# Patient Record
Sex: Female | Born: 1940
Health system: Southern US, Community
[De-identification: ages and names within clinical notes are randomized; demographics above are authoritative.]

## PROBLEM LIST (undated history)

## (undated) DIAGNOSIS — Z860100 Personal history of colon polyps, unspecified: Secondary | ICD-10-CM

## (undated) DIAGNOSIS — E039 Hypothyroidism, unspecified: Secondary | ICD-10-CM

## (undated) DIAGNOSIS — I4819 Other persistent atrial fibrillation: Secondary | ICD-10-CM

## (undated) DIAGNOSIS — R87610 Atypical squamous cells of undetermined significance on cytologic smear of cervix (ASC-US): Secondary | ICD-10-CM

## (undated) DIAGNOSIS — I340 Nonrheumatic mitral (valve) insufficiency: Secondary | ICD-10-CM

## (undated) DIAGNOSIS — C801 Malignant (primary) neoplasm, unspecified: Secondary | ICD-10-CM

## (undated) DIAGNOSIS — M5430 Sciatica, unspecified side: Secondary | ICD-10-CM

## (undated) DIAGNOSIS — E559 Vitamin D deficiency, unspecified: Secondary | ICD-10-CM

## (undated) DIAGNOSIS — M545 Low back pain, unspecified: Secondary | ICD-10-CM

## (undated) DIAGNOSIS — G4700 Insomnia, unspecified: Secondary | ICD-10-CM

## (undated) HISTORY — PX: TONSILLECTOMY: SUR1361

## (undated) HISTORY — DX: Personal history of colon polyps, unspecified: Z86.0100

## (undated) HISTORY — DX: Other persistent atrial fibrillation: I48.19

## (undated) HISTORY — DX: Atypical squamous cells of undetermined significance on cytologic smear of cervix (ASC-US): R87.610

## (undated) HISTORY — DX: Low back pain, unspecified: M54.50

## (undated) HISTORY — DX: Vitamin D deficiency, unspecified: E55.9

## (undated) HISTORY — PX: WISDOM TOOTH EXTRACTION: SHX21

## (undated) HISTORY — DX: Low back pain: M54.5

## (undated) HISTORY — DX: Hypothyroidism, unspecified: E03.9

## (undated) HISTORY — DX: Nonrheumatic mitral (valve) insufficiency: I34.0

## (undated) HISTORY — DX: Personal history of colonic polyps: Z86.010

## (undated) HISTORY — PX: COLON RESECTION: SHX5231

## (undated) HISTORY — DX: Insomnia, unspecified: G47.00

## (undated) HISTORY — DX: Malignant (primary) neoplasm, unspecified: C80.1

## (undated) HISTORY — DX: Sciatica, unspecified side: M54.30

---

## 2016-05-17 LAB — HM PAP SMEAR: HM Pap smear: NORMAL

## 2016-09-07 DIAGNOSIS — N3000 Acute cystitis without hematuria: Secondary | ICD-10-CM | POA: Diagnosis not present

## 2016-09-07 DIAGNOSIS — R300 Dysuria: Secondary | ICD-10-CM | POA: Diagnosis not present

## 2016-09-07 DIAGNOSIS — N898 Other specified noninflammatory disorders of vagina: Secondary | ICD-10-CM | POA: Diagnosis not present

## 2016-09-07 DIAGNOSIS — N952 Postmenopausal atrophic vaginitis: Secondary | ICD-10-CM | POA: Diagnosis not present

## 2016-09-07 DIAGNOSIS — N3001 Acute cystitis with hematuria: Secondary | ICD-10-CM | POA: Diagnosis not present

## 2016-10-24 DIAGNOSIS — M1712 Unilateral primary osteoarthritis, left knee: Secondary | ICD-10-CM | POA: Diagnosis not present

## 2016-10-24 DIAGNOSIS — M25562 Pain in left knee: Secondary | ICD-10-CM | POA: Diagnosis not present

## 2017-05-07 DIAGNOSIS — Z23 Encounter for immunization: Secondary | ICD-10-CM | POA: Diagnosis not present

## 2017-05-21 DIAGNOSIS — I482 Chronic atrial fibrillation: Secondary | ICD-10-CM | POA: Diagnosis not present

## 2017-05-24 DIAGNOSIS — Z23 Encounter for immunization: Secondary | ICD-10-CM | POA: Diagnosis not present

## 2017-06-20 DIAGNOSIS — Z1231 Encounter for screening mammogram for malignant neoplasm of breast: Secondary | ICD-10-CM | POA: Diagnosis not present

## 2017-06-20 DIAGNOSIS — Z6827 Body mass index (BMI) 27.0-27.9, adult: Secondary | ICD-10-CM | POA: Diagnosis not present

## 2017-06-20 DIAGNOSIS — L282 Other prurigo: Secondary | ICD-10-CM | POA: Diagnosis not present

## 2017-06-20 DIAGNOSIS — I482 Chronic atrial fibrillation: Secondary | ICD-10-CM | POA: Diagnosis not present

## 2017-06-20 DIAGNOSIS — G4709 Other insomnia: Secondary | ICD-10-CM | POA: Diagnosis not present

## 2017-06-20 DIAGNOSIS — Z01419 Encounter for gynecological examination (general) (routine) without abnormal findings: Secondary | ICD-10-CM | POA: Diagnosis not present

## 2017-07-03 DIAGNOSIS — I482 Chronic atrial fibrillation: Secondary | ICD-10-CM | POA: Diagnosis not present

## 2017-07-16 DIAGNOSIS — I482 Chronic atrial fibrillation: Secondary | ICD-10-CM | POA: Diagnosis not present

## 2017-07-16 DIAGNOSIS — R002 Palpitations: Secondary | ICD-10-CM | POA: Diagnosis not present

## 2017-07-16 DIAGNOSIS — I340 Nonrheumatic mitral (valve) insufficiency: Secondary | ICD-10-CM | POA: Diagnosis not present

## 2017-10-19 DIAGNOSIS — M1712 Unilateral primary osteoarthritis, left knee: Secondary | ICD-10-CM | POA: Diagnosis not present

## 2017-10-19 DIAGNOSIS — M25562 Pain in left knee: Secondary | ICD-10-CM | POA: Diagnosis not present

## 2017-10-26 DIAGNOSIS — H6121 Impacted cerumen, right ear: Secondary | ICD-10-CM | POA: Diagnosis not present

## 2017-11-08 DIAGNOSIS — Z1231 Encounter for screening mammogram for malignant neoplasm of breast: Secondary | ICD-10-CM | POA: Diagnosis not present

## 2017-11-08 DIAGNOSIS — Z9289 Personal history of other medical treatment: Secondary | ICD-10-CM | POA: Diagnosis not present

## 2017-11-08 LAB — HM MAMMOGRAPHY

## 2017-11-09 DIAGNOSIS — M1712 Unilateral primary osteoarthritis, left knee: Secondary | ICD-10-CM | POA: Diagnosis not present

## 2018-04-26 DIAGNOSIS — Z23 Encounter for immunization: Secondary | ICD-10-CM | POA: Diagnosis not present

## 2018-05-08 DIAGNOSIS — J3089 Other allergic rhinitis: Secondary | ICD-10-CM | POA: Diagnosis not present

## 2018-05-08 DIAGNOSIS — R2681 Unsteadiness on feet: Secondary | ICD-10-CM | POA: Insufficient documentation

## 2018-05-21 DIAGNOSIS — I499 Cardiac arrhythmia, unspecified: Secondary | ICD-10-CM | POA: Insufficient documentation

## 2018-05-21 DIAGNOSIS — I38 Endocarditis, valve unspecified: Secondary | ICD-10-CM | POA: Insufficient documentation

## 2018-05-21 DIAGNOSIS — T7840XA Allergy, unspecified, initial encounter: Secondary | ICD-10-CM | POA: Insufficient documentation

## 2018-05-28 ENCOUNTER — Ambulatory Visit (INDEPENDENT_AMBULATORY_CARE_PROVIDER_SITE_OTHER): Payer: Medicare Other | Admitting: Internal Medicine

## 2018-05-28 VITALS — BP 128/78 | HR 87 | Temp 98.1°F | Ht 66.0 in | Wt 160.1 lb

## 2018-05-28 DIAGNOSIS — E2839 Other primary ovarian failure: Secondary | ICD-10-CM

## 2018-05-28 DIAGNOSIS — I38 Endocarditis, valve unspecified: Secondary | ICD-10-CM | POA: Diagnosis not present

## 2018-05-28 DIAGNOSIS — E559 Vitamin D deficiency, unspecified: Secondary | ICD-10-CM | POA: Insufficient documentation

## 2018-05-28 DIAGNOSIS — R42 Dizziness and giddiness: Secondary | ICD-10-CM | POA: Insufficient documentation

## 2018-05-28 DIAGNOSIS — M51369 Other intervertebral disc degeneration, lumbar region without mention of lumbar back pain or lower extremity pain: Secondary | ICD-10-CM | POA: Insufficient documentation

## 2018-05-28 DIAGNOSIS — I4811 Longstanding persistent atrial fibrillation: Secondary | ICD-10-CM | POA: Insufficient documentation

## 2018-05-28 DIAGNOSIS — I4819 Other persistent atrial fibrillation: Secondary | ICD-10-CM | POA: Diagnosis not present

## 2018-05-28 DIAGNOSIS — R05 Cough: Secondary | ICD-10-CM

## 2018-05-28 DIAGNOSIS — J329 Chronic sinusitis, unspecified: Secondary | ICD-10-CM | POA: Diagnosis not present

## 2018-05-28 DIAGNOSIS — G4700 Insomnia, unspecified: Secondary | ICD-10-CM | POA: Diagnosis not present

## 2018-05-28 DIAGNOSIS — K1370 Unspecified lesions of oral mucosa: Secondary | ICD-10-CM

## 2018-05-28 DIAGNOSIS — M5136 Other intervertebral disc degeneration, lumbar region: Secondary | ICD-10-CM | POA: Diagnosis not present

## 2018-05-28 DIAGNOSIS — Z85038 Personal history of other malignant neoplasm of large intestine: Secondary | ICD-10-CM | POA: Diagnosis not present

## 2018-05-28 DIAGNOSIS — E039 Hypothyroidism, unspecified: Secondary | ICD-10-CM | POA: Diagnosis not present

## 2018-05-28 DIAGNOSIS — T7840XD Allergy, unspecified, subsequent encounter: Secondary | ICD-10-CM

## 2018-05-28 DIAGNOSIS — R059 Cough, unspecified: Secondary | ICD-10-CM

## 2018-05-28 MED ORDER — AZITHROMYCIN 250 MG PO TABS: ORAL_TABLET | ORAL | 0 refills | Status: DC

## 2018-05-28 NOTE — Progress Notes (Signed)
Patient was informed of results.  Patient understood and no questions, comments, or concerns at this time.  

## 2018-05-28 NOTE — Patient Instructions (Addendum)
Call and please clarify did you have prevnar or pneumonia 23 vaccine? It looks like prevnar 05/2015 but unclear from the notes   prevnar 1 vaccine and done  Pneumonia 23 vaccine every 5 years  rec Tdap vaccine every 10 years I will check vaccine database for that it seems like you've had this one     [facility] fasting labs as soon as possible [phone]   We will order bone density [facility]    Cough, Adult Coughing is a reflex that clears your throat and your airways. Coughing helps to heal and protect your lungs. It is normal to cough occasionally, but a cough that happens with other symptoms or lasts a long time may be a sign of a condition that needs treatment. A cough may last only 2-3 weeks (acute), or it may last longer than 8 weeks (chronic). What are the causes? Coughing is commonly caused by:  Breathing in substances that irritate your lungs.  A viral or bacterial respiratory infection.  Allergies.  Asthma.  Postnasal drip.  Smoking.  Acid backing up from the stomach into the esophagus (gastroesophageal reflux).  Certain medicines.  Chronic lung problems, including COPD (or rarely, lung cancer).  Other medical conditions such as heart failure.  Follow these instructions at home: Pay attention to any changes in your symptoms. Take these actions to help with your discomfort:  Take medicines only as told by your health care provider. ? If you were prescribed an antibiotic medicine, take it as told by your health care provider. Do not stop taking the antibiotic even if you start to feel better. ? Talk with your health care provider before you take a cough suppressant medicine.  Drink enough fluid to keep your urine clear or pale yellow.  If the air is dry, use a cold steam vaporizer or humidifier in your bedroom or your home to help loosen secretions.  Avoid anything that causes you to cough at work or  at home.  If your cough is worse at night, try sleeping in a semi-upright position.  Avoid cigarette smoke. If you smoke, quit smoking. If you need help quitting, ask your health care provider.  Avoid caffeine.  Avoid alcohol.  Rest as needed.  Contact a health care provider if:  You have new symptoms.  You cough up pus.  Your cough does not get better after 2-3 weeks, or your cough gets worse.  You cannot control your cough with suppressant medicines and you are losing sleep.  You develop pain that is getting worse or pain that is not controlled with pain medicines.  You have a fever.  You have unexplained weight loss.  You have night sweats. Get help right away if:  You cough up blood.  You have difficulty breathing.  Your heartbeat is very fast. This information is not intended to replace advice given to you by your health care provider. Make sure you discuss any questions you have with your health care provider. Document Released: 01/13/2011 Document Revised: 12/23/2015 Document Reviewed: 09/23/2014 Elsevier Interactive Patient Education  2018 [employer].      DTaP Vaccine (Diphtheria, Tetanus, and Pertussis): What You Need to Know 1. Why get vaccinated? Diphtheria, tetanus, and pertussis are serious diseases caused by bacteria. Diphtheria and pertussis are spread from person to person. Tetanus enters the body through cuts or wounds. DIPHTHERIA causes a thick covering in the back of the throat.  It can lead to breathing problems, paralysis, heart failure, and even death.  TETANUS (Lockjaw) causes painful tightening of the muscles, usually all over the body.  It can lead to "locking" of the jaw so the victim cannot open his mouth or swallow. Tetanus leads to death in up to 2 out of 10 cases.  PERTUSSIS (Whooping Cough) causes coughing spells so bad that it is hard for infants to eat, drink, or breathe. These spells can last for weeks.  It can lead to  pneumonia, seizures (jerking and staring spells), brain damage, and death.  Diphtheria, tetanus, and pertussis vaccine (DTaP) can help prevent these diseases. Most children who are vaccinated with DTaP will be protected throughout childhood. Many more children would get these diseases if we stopped vaccinating. DTaP is a safer version of an older vaccine called DTP. DTP is no longer used in the [place]. 2. Who should get DTaP vaccine and when? Children should get 5 doses of DTaP vaccine, one dose at each of the following ages:  2 months  4 months  6 months  15-18 months  4-6 years  DTaP may be given at the same time as other vaccines. 3. Some children should not get DTaP vaccine or should wait  Children with minor illnesses, such as a cold, may be vaccinated. But children who are moderately or severely ill should usually wait until they recover before getting DTaP vaccine.  Any child who had a life-threatening allergic reaction after a dose of DTaP should not get another dose.  Any child who suffered a brain or nervous system disease within 7 days after a dose of DTaP should not get another dose.  Talk with your doctor if your child: ? had a seizure or collapsed after a dose of DTaP, ? cried non-stop for 3 hours or more after a dose of DTaP, ? had a fever over 105F after a dose of DTaP. Ask your doctor for more information. Some of these children should not get another dose of pertussis vaccine, but may get a vaccine without pertussis, called DT. 4. Older children and adults DTaP is not licensed for adolescents, adults, or children 77 years of age and older. But older people still need protection. A vaccine called Tdap is similar to DTaP. A single dose of Tdap is recommended for people 11 through 79 years of age. Another vaccine, called Td, protects against tetanus and diphtheria, but not pertussis. It is recommended every 10 years. There are separate Vaccine Information  Statements for these vaccines. 5. What are the risks from DTaP vaccine? Getting diphtheria, tetanus, or pertussis disease is much riskier than getting DTaP vaccine. However, a vaccine, like any medicine, is capable of causing serious problems, such as severe allergic reactions. The risk of DTaP vaccine causing serious harm, or death, is extremely small. Mild problems (common)  Fever (up to about 1 child in 4)  Redness or swelling where the shot was given (up to about 1 child in 4)  Soreness or tenderness where the shot was given (up to about 1 child in 4) These problems occur more often after the 4th and 5th doses of the DTaP series than after earlier doses. Sometimes the 4th or 5th dose of DTaP vaccine is followed by swelling of the entire arm or leg in which the shot was given, lasting 1-7 days (up to about 1 child in 13). Other mild problems include:  Fussiness (up to about 1 child in 3)  Tiredness or  poor appetite (up to about 1 child in 10)  Vomiting (up to about 1 child in 3) These problems generally occur 1-3 days after the shot. Moderate problems (uncommon)  Seizure (jerking or staring) (about 1 child out of 14,000)  Non-stop crying, for 3 hours or more (up to about 1 child out of 1,000)  High fever, over 105F (about 1 child out of 16,000) Severe problems (very rare)  Serious allergic reaction (less than 1 out of a million doses)  Several other severe problems have been reported after DTaP vaccine. These include: ? Long-term seizures, coma, or lowered consciousness ? Permanent brain damage. These are so rare it is hard to tell if they are caused by the vaccine. Controlling fever is especially important for children who have had seizures, for any reason. It is also important if another family member has had seizures. You can reduce fever and pain by giving your child an aspirin-free pain reliever when the shot is given, and for the next 24 hours, following the package  instructions. 6. What if there is a serious reaction? What should I look for? Look for anything that concerns you, such as signs of a severe allergic reaction, very high fever, or behavior changes. Signs of a severe allergic reaction can include hives, swelling of the face and throat, difficulty breathing, a fast heartbeat, dizziness, and weakness. These would start a few minutes to a few hours after the vaccination. What should I do?  If you think it is a severe allergic reaction or other emergency that can't wait, call 9-1-1 or get the person to the nearest hospital. Otherwise, call your doctor.  Afterward, the reaction should be reported to the Vaccine Adverse Event Reporting System (VAERS). Your doctor might file this report, or you can do it yourself through the VAERS web site at www.vaers.SamedayNews.es, or by calling 902-851-6765. ? VAERS is only for reporting reactions. They do not give medical advice. 7. The National Vaccine Injury Compensation Program The [employer] Vaccine Injury Compensation Program (VICP) is a federal program that was created to compensate people who may have been injured by certain vaccines. Persons who believe they may have been injured by a vaccine can learn about the program and about filing a claim by calling 508-408-4166 or visiting the [employer] website at GoldCloset.com.ee. 8. How can I learn more?  Ask your doctor.  Call your local or state health department.  Contact the Centers for Disease Control and Prevention (CDC): ? Call 445-179-4895 (1-800-CDC-INFO) or ? Visit CDC's website at http://hunter.com/ CDC DTaP Vaccine (Diphtheria, Tetanus, and Pertussis) VIS (12/14/05) This information is not intended to replace advice given to you by your health care provider. Make sure you discuss any questions you have with your health care provider. Document Released: 05/14/2006 Document Revised: 04/06/2016 Document Reviewed: 04/06/2016 Elsevier  Interactive Patient Education  2017 [employer].

## 2018-05-28 NOTE — Progress Notes (Signed)
Chief Complaint  Patient presents with  . Establish Care   New pt moved from [place] lives in [place] friend is patient here   1. H/o persistent Afib h/o echo 07/03/17 with moderate to severe mitral regurg and mild pulmonic valve regurgitation  -prev cardiologist Dr. Limmie Patricia  -echo 07/03/17 EF 55-60% mild LVH, severely abnormal left atrial volume index, RA severely dilated, moderate to severe mitral regurg, severe tricuspid regurg, possible pulm HTN PAP 37-50 mmHG, mild pulmonic valve regurgitation   She is on Xarelto 20 mg qd and toprol xl 25 mg qd   2. H/o colon cancer due for colonoscopy q3 years she will go back to prior GI MD iDr. Jearld Pies [place] for now   3. C/o dizziness chronic since 2017 per notes w/o vertigo Meclizine has been given to her in the past w/o relief. Just saw ENT Dr. Hassell Done in [place] who thinks she has chronic sinus issues and allergies and she is pending to be tested for allergies   4. H/o neuropathy vs sciatica/lumbar radiculopathy (see Xray low back)  Xray 01/28/13 multilevel Deg changes with mild anterolisthesis L3 on L4 and L4 on L5   5. Hypothyroidism with h/o goiter per notes though [place] 05/23/16 neegative and abnormal thyroid labs elevated TSH 5.950 05/03/15 and elevated thyroglobulin 7.0 in the past she is on levo 75 mcg qd  6. C/o Chronic cough ? Etiology she is former smoker light but quit 40 years ago  29. Vitamin D def 26.6 05/18/16  8.  Right of uvula in 0.2 cm papule patient has not noted this and just went to ENT who did not mention  9.  insomnia on valium 5 mg qhs prn     Review of Systems  Constitutional: Negative for weight loss.  Eyes: Negative for blurred vision.  Respiratory: Positive for cough. Negative for shortness of breath.   Cardiovascular: Negative for chest pain.  Gastrointestinal: Negative for heartburn.  Musculoskeletal: Negative for falls.  Skin:       C/o hair loss    Neurological: Positive for  dizziness and sensory change.  Psychiatric/Behavioral: Negative for depression. The patient has insomnia.    Past Medical History:  Diagnosis Date  . Atrial fibrillation, persistent   . Cancer [facility])    colon cancer 1999 s/p resection   . History of colon polyps   . Insomnia   . Low back pain   . Mitral regurgitation   . Pap smear abnormality of cervix with ASCUS favoring benign   . Sciatica   . Vitamin D deficiency    Past Surgical History:  Procedure Laterality Date  . COLON RESECTION     1999 for cancer   . TONSILLECTOMY    . WISDOM TOOTH EXTRACTION     Family History  Problem Relation Age of Onset  . Heart disease Father   . Hyperlipidemia Father   . Osteopenia Sister    Social History   Socioeconomic History  . Marital status: Divorced    Spouse name: Not on file  . Number of children: Not on file  . Years of education: Not on file  . Highest education level: Not on file  Occupational History  . Not on file  Social Needs  . Financial resource strain: Not on file  . Food insecurity:    Worry: Not on file    Inability: Not on file  . Transportation needs:    Medical: Not on  file    Non-medical: Not on file  Tobacco Use  . Smoking status: Former Research scientist (life sciences)  . Smokeless tobacco: Never Used  . Tobacco comment: former quit in 30s light smoker per pt   Substance and Sexual Activity  . Alcohol use: Yes  . Drug use: Not Currently  . Sexual activity: Not Currently  Lifestyle  . Physical activity:    Days per week: Not on file    Minutes per session: Not on file  . Stress: Not on file  Relationships  . Social connections:    Talks on phone: Not on file    Gets together: Not on file    Attends religious service: Not on file    Active member of club or organization: Not on file    Attends meetings of clubs or organizations: Not on file    Relationship status: Not on file  . Intimate partner violence:    Fear of current or ex partner: Not on file    Emotionally  abused: Not on file    Physically abused: Not on file    Forced sexual activity: Not on file  Other Topics Concern  . Not on file  Social History Narrative   Divorced    College grad    Moved from [place] to [place]   Former Office manager    No guns   Wears seat belts    Current Meds  Medication Sig  . Calcium Carb-Cholecalciferol (CALCIUM PLUS VITAMIN D3 PO) Take by mouth.  . diazepam (VALIUM) 5 MG tablet Take 5 mg by mouth daily as needed for anxiety.  . metoprolol succinate (TOPROL-XL) 25 MG 24 hr tablet 25 mg daily.   Marland Kitchen SYNTHROID 75 MCG tablet Take 75 mcg by mouth daily before breakfast.   . XARELTO 20 MG TABS tablet Take 20 mg by mouth daily.    Allergies  Allergen Reactions  . Codeine     Nausea    . Other     Tree pollen     No results found for this or any previous visit (from the past 2160 hour(s)). Objective  Body mass index is 25.84 kg/m. Wt Readings from Last 3 Encounters:  05/28/18 160 lb 1.9 oz (72.6 kg)   Temp Readings from Last 3 Encounters:  05/28/18 98.1 F (36.7 C) (Oral)   BP Readings from Last 3 Encounters:  05/28/18 128/78   Pulse Readings from Last 3 Encounters:  05/28/18 87    Physical Exam  Constitutional: She is oriented to person, place, and time. Vital signs are normal. She appears well-developed and well-nourished. She is cooperative.  HENT:  Head: Normocephalic and atraumatic.  Mouth/Throat: Oropharynx is clear and moist and mucous membranes are normal.    Eyes: Pupils are equal, round, and reactive to light. Conjunctivae are normal.  Cardiovascular: Normal rate and normal heart sounds. An irregularly irregular rhythm present.  In Afib today    Pulmonary/Chest: Effort normal and breath sounds normal.  Neurological: She is alert and oriented to person, place, and time.  BL walks with cane   Skin: Skin is warm, dry and intact.  Psychiatric: She has a normal mood and affect. Her speech is normal and  behavior is normal. Judgment and thought content normal. Cognition and memory are normal.  Nursing note and vitals reviewed.   Assessment   1. H/o persistent Afib h/o echo 07/03/17 with moderate to severe mitral regurg and mild pulmonic valve regurgitation  -prev cardiologist Dr. Waldemar Dickens  [place]  -echo 07/03/17 EF 55-60% mild LVH, severely abnormal left atrial volume index, RA severely dilated, moderate to severe mitral regurg, severe tricuspid regurg, possible pulm HTN PAP 37-50 mmHG, mild pulmonic valve regurgitation   2. H/o colon cancer  3. C/o dizziness chronic since 2017 per notes w/o vertigo sxs ddx chronic sinus issues, r/o other central etiology, ? If could be related orthostatics vs persistent Afib 4. H/o neuropathy vs sciatica/lumbar radiculopathy  5. Hypothyroidism with h/o goiter and abnormal thyroid labs elevated TSH 5.950 05/03/15 and elevated thyroglobulin 7.0 in the past  Thyroid US 05/23/16 unremarkable  6. Chronic cough ? Etiology she is former smoker so copd/bronchitis in ddx. Also GERD 7. Vitamin D def 26.6 05/18/16  8. HM 9. Right of uvula in 0.2 cm papule  10 insomnia  Plan  1. Pt maybe considering Dr. Fletcher Anon but will confirm and let me know also  Cont BB and Xarelto  2. Will need GI sees Dr. Jearld Pies in [place] pt states she may continue to f/u with him due now repeat colonoscopy  3. Disc we could w/u with MRI brain she wants to wait for now  Consider neurology in future  No meclizine room is not spinning like vertigo and tried in the past 12.5 mg per notes and does not help  Consider check orthostatics at f/u  She just saw ENT Dr. Hassell Done in [facility] pending allergy test  tx zpack for sinus disease short term to see if helps sinus sxs  4. Consider neurology in future  Consider MRI lumbar  5. Check labs upcoming h/o thyroglobulin elevated  Consider repeat thyroid US Cont meds  If thyroid labs abnormal consider endocrine  6. Disc CXR today pt does not  want today  7. Check vitamin D level 8.  Flu shot had 04/26/18  pna 23 vaccine had 05/22/17, due for prevnar if has not had  Consider shingrix and Tdap in future if has not had check [facility] Declines MMR check   Given [facility] form to get fasting labs [facility] CMET, CBC, lipid, UA, TSH, FT4, FT3, thryoglobulin Ab (h/o elevation 7.0 in the past 05/17/16) , anti thyroid antibody (TPO antibody), vitamin D, ferritin/iron/TIBC, vitamin D. Declines MMR  Pap last 2017 Ob/GYN Dr. Olean Ree [facility] [place] h/o ASCUS pap with h/o HPV + per prior PCP notes  -per notes pap 05/17/16 neg pap +atrophy neg HPV  -HPV + neg pap 09/02/12 -ASCUS pap 02/12/12 +HPV -out of age window pap currently   mammo 11/08/17 [facility] normal  -next she wants to have in [place] will need to find facility there she can go  dexa pt wanted to do [facility] [place] then called back and wants to go in [place] does not know name of facility will call around [facility] will check in [place] if no DEXA available go with original plan  Coloscopy q3 years last in 2015 or 2016 h/o colon cancer s/p resection 1999 due to f/u per pt consider to f/u with Dr. Jearld Pies in [place]   Former smoker quit in 89s light smoker   Reviewed records (I.e labs, mammo, pap, [place] thyriod and Xray low back and echo) - former PCP at [facility] [place]   9. Disc with pt show ENT or dentist  10 on valium 5 mg qhs  Provider: Dr. Olivia Mackie [facility]

## 2018-05-29 ENCOUNTER — Encounter: Payer: Self-pay | Admitting: Internal Medicine

## 2018-05-29 ENCOUNTER — Telehealth: Payer: Self-pay | Admitting: Internal Medicine

## 2018-05-29 NOTE — Telephone Encounter (Signed)
Copied from [place] [id number]. Topic: Referral - Question >> May 29, 2018  2:17 PM Judyann Munson wrote: Reason for CRM: Patient was seen today and is calling to advise she would like her Bone density test to be completed closure to home in [place] . She is also stating that she is still searching for the information of when she received her pneumonia shot

## 2018-05-29 NOTE — Telephone Encounter (Signed)
Copied from [place] [id number]. Topic: Referral - Question >> May 29, 2018  2:17 PM Judyann Munson wrote: Reason for CRM: Patient was seen today and is calling to advise she would like her Bone density test to be completed closure to home in [place] . She is also stating that she is still searching for the information of when she received her pneumonia shot >> May 29, 2018  2:43 PM Vernona Rieger wrote: Patient called back and said she had " pneumonzax23 October 25th, 2018

## 2018-05-29 NOTE — Telephone Encounter (Signed)
We can send her to [facility] in [place] if they do bone densities will call to verify  [place] can you call to see if they do bone densities there   Micael [place] pneumona 23 vaccine given 05/24/17   Fransisco Beau  Ask pt if she has had prevnar?   Thanks [employer]

## 2018-05-30 NOTE — Telephone Encounter (Signed)
She has not had prevnar

## 2018-05-31 NOTE — Telephone Encounter (Signed)
They do not do bone densities @ [facility]. Was given [facility] and [facility]. Called both places. They only do bone densities for their established patients.

## 2018-05-31 NOTE — Telephone Encounter (Signed)
Inform pt she needs to have bone density where I originally ordered it [facility] [place] [facility]  Otherwise sch in [place]   TSM

## 2018-06-03 ENCOUNTER — Other Ambulatory Visit: Payer: Self-pay | Admitting: Internal Medicine

## 2018-06-03 DIAGNOSIS — I4891 Unspecified atrial fibrillation: Secondary | ICD-10-CM | POA: Diagnosis not present

## 2018-06-03 DIAGNOSIS — E559 Vitamin D deficiency, unspecified: Secondary | ICD-10-CM | POA: Diagnosis not present

## 2018-06-03 DIAGNOSIS — E611 Iron deficiency: Secondary | ICD-10-CM | POA: Diagnosis not present

## 2018-06-03 DIAGNOSIS — Z1389 Encounter for screening for other disorder: Secondary | ICD-10-CM | POA: Diagnosis not present

## 2018-06-03 DIAGNOSIS — D649 Anemia, unspecified: Secondary | ICD-10-CM | POA: Diagnosis not present

## 2018-06-03 DIAGNOSIS — E039 Hypothyroidism, unspecified: Secondary | ICD-10-CM | POA: Diagnosis not present

## 2018-06-03 DIAGNOSIS — Z13220 Encounter for screening for lipoid disorders: Secondary | ICD-10-CM | POA: Diagnosis not present

## 2018-06-03 DIAGNOSIS — R946 Abnormal results of thyroid function studies: Secondary | ICD-10-CM | POA: Diagnosis not present

## 2018-06-04 LAB — URINALYSIS, ROUTINE W REFLEX MICROSCOPIC
Bilirubin, UA: NEGATIVE
Glucose, UA: NEGATIVE
Ketones, UA: NEGATIVE
Nitrite, UA: NEGATIVE
Protein, UA: NEGATIVE
RBC, UA: NEGATIVE
Specific Gravity, UA: 1.018 (ref 1.005–1.030)
Urobilinogen, Ur: 0.2 mg/dL (ref 0.2–1.0)
pH, UA: 6 (ref 5.0–7.5)

## 2018-06-04 LAB — COMPREHENSIVE METABOLIC PANEL
ALT: 26 IU/L (ref 0–32)
AST: 24 IU/L (ref 0–40)
Albumin/Globulin Ratio: 1.8 (ref 1.2–2.2)
Albumin: 4.8 g/dL (ref 3.5–4.8)
Alkaline Phosphatase: 86 IU/L (ref 39–117)
BUN/Creatinine Ratio: 23 (ref 12–28)
BUN: 18 mg/dL (ref 8–27)
Bilirubin Total: 0.8 mg/dL (ref 0.0–1.2)
CO2: 23 mmol/L (ref 20–29)
Calcium: 9.9 mg/dL (ref 8.7–10.3)
Chloride: 100 mmol/L (ref 96–106)
Creatinine, Ser: 0.77 mg/dL (ref 0.57–1.00)
GFR calc Af Amer: 85 mL/min/{1.73_m2} (ref >59)
GFR calc non Af Amer: 74 mL/min/{1.73_m2} (ref >59)
Globulin, Total: 2.6 g/dL (ref 1.5–4.5)
Glucose: 90 mg/dL (ref 65–99)
Potassium: 4.2 mmol/L (ref 3.5–5.2)
Sodium: 139 mmol/L (ref 134–144)
Total Protein: 7.4 g/dL (ref 6.0–8.5)

## 2018-06-04 LAB — CBC WITH DIFFERENTIAL/PLATELET
Basophils Absolute: 0.1 10*3/uL (ref 0.0–0.2)
Basos: 1 %
EOS (ABSOLUTE): 0.1 10*3/uL (ref 0.0–0.4)
Eos: 2 %
Hematocrit: 50 % — ABNORMAL HIGH (ref 34.0–46.6)
Hemoglobin: 17.1 g/dL — ABNORMAL HIGH (ref 11.1–15.9)
Immature Grans (Abs): 0 10*3/uL (ref 0.0–0.1)
Immature Granulocytes: 0 %
Lymphocytes Absolute: 1.9 10*3/uL (ref 0.7–3.1)
Lymphs: 32 %
MCH: 34.2 pg — ABNORMAL HIGH (ref 26.6–33.0)
MCHC: 34.2 g/dL (ref 31.5–35.7)
MCV: 100 fL — ABNORMAL HIGH (ref 79–97)
Monocytes Absolute: 0.4 10*3/uL (ref 0.1–0.9)
Monocytes: 6 %
Neutrophils Absolute: 3.5 10*3/uL (ref 1.4–7.0)
Neutrophils: 59 %
Platelets: 259 10*3/uL (ref 150–450)
RBC: 5 x10E6/uL (ref 3.77–5.28)
RDW: 12.2 % — ABNORMAL LOW (ref 12.3–15.4)
WBC: 5.9 10*3/uL (ref 3.4–10.8)

## 2018-06-04 LAB — LIPID PANEL W/O CHOL/HDL RATIO
Cholesterol, Total: 198 mg/dL (ref 100–199)
HDL: 75 mg/dL (ref >39)
LDL Calculated: 98 mg/dL (ref 0–99)
Triglycerides: 123 mg/dL (ref 0–149)
VLDL Cholesterol Cal: 25 mg/dL (ref 5–40)

## 2018-06-04 LAB — THYROID ANTIBODIES
Thyroglobulin Antibody: 3.8 IU/mL — ABNORMAL HIGH (ref 0.0–0.9)
Thyroperoxidase Ab SerPl-aCnc: 60 IU/mL — ABNORMAL HIGH (ref 0–34)

## 2018-06-04 LAB — IRON AND TIBC
Iron Saturation: 51 % (ref 15–55)
Iron: 156 ug/dL — ABNORMAL HIGH (ref 27–139)
Total Iron Binding Capacity: 303 ug/dL (ref 250–450)
UIBC: 147 ug/dL (ref 118–369)

## 2018-06-04 LAB — MICROSCOPIC EXAMINATION
Bacteria, UA: NONE SEEN
Casts: NONE SEEN /lpf

## 2018-06-04 LAB — T4, FREE: Free T4: 1.44 ng/dL (ref 0.82–1.77)

## 2018-06-04 LAB — TSH: TSH: 3.64 u[IU]/mL (ref 0.450–4.500)

## 2018-06-04 LAB — VITAMIN D 25 HYDROXY (VIT D DEFICIENCY, FRACTURES): Vit D, 25-Hydroxy: 85.8 ng/mL (ref 30.0–100.0)

## 2018-06-04 LAB — FERRITIN: Ferritin: 398 ng/mL — ABNORMAL HIGH (ref 15–150)

## 2018-06-04 LAB — T3, FREE: T3, Free: 2.5 pg/mL (ref 2.0–4.4)

## 2018-06-05 ENCOUNTER — Telehealth: Payer: Self-pay | Admitting: Internal Medicine

## 2018-06-05 NOTE — Telephone Encounter (Signed)
Blood cts elevated hemoglobin and hematocrit 17.0/50.0 normal is 15.9/46.6  Iron is slightly elevated will monitor 156 normal is 139 and ferritin elevated 398 normal is 150  -I will rec a hematology referral to further work up is she agreeable?    Liver kidneys normal  Urine trace white blood cells likely contaminant  Cholesterol normal    Thyroid lab normal TSH 3.640 and Free T4 and Free T3 normal  but thyroid antibodies elevated 60 normal is 34 and thyroglobulin elevated 3.8 and normal is 0.9 -I rec an endocrine referral to further work up as this has been noted in prior labs  -is she agreeable?    Vitamin D normal

## 2018-06-05 NOTE — Telephone Encounter (Signed)
Left message for patient to return call back. [facility] may give results and obtain information.  

## 2018-06-10 NOTE — Telephone Encounter (Signed)
Please advise 

## 2018-06-10 NOTE — Telephone Encounter (Signed)
Left message for pt to return call to office.

## 2018-06-10 NOTE — Telephone Encounter (Signed)
Pt. returned call for lab results.  Advised of results as noted by telephone note on 06/05/18, per Dr. Terese Door.  Pt. voiced frustration of not being able to speak directly to the office.  Stated "the [facility] system is anything but accessible."  Requested to have Dr. Olivia Mackie call her to further discuss results.  Asking "how these abnormalities have suddenly shown up"; asking "if the elevations can have life-threatening consequences"; and also asking "if these elevations can have life-altering side effects"?   Stated "I want to speak directly to Dr. Olivia Mackie about these results.  I don't want to agree to being referred to Hematology or Endocrinology, until I have a better understanding of what the results could mean."    Advised will send message to Dr. Terese Door.  Agrees with plan.

## 2018-06-10 NOTE — Telephone Encounter (Signed)
Pt would like lab results. Please advise Cb#[phone]

## 2018-06-11 ENCOUNTER — Other Ambulatory Visit (HOSPITAL_BASED_OUTPATIENT_CLINIC_OR_DEPARTMENT_OTHER): Payer: BLUE CROSS/BLUE SHIELD

## 2018-06-12 ENCOUNTER — Other Ambulatory Visit: Payer: Self-pay | Admitting: Internal Medicine

## 2018-06-12 DIAGNOSIS — E063 Autoimmune thyroiditis: Secondary | ICD-10-CM

## 2018-06-12 DIAGNOSIS — R790 Abnormal level of blood mineral: Secondary | ICD-10-CM

## 2018-06-12 DIAGNOSIS — D751 Secondary polycythemia: Secondary | ICD-10-CM

## 2018-06-12 DIAGNOSIS — R7989 Other specified abnormal findings of blood chemistry: Secondary | ICD-10-CM

## 2018-06-12 NOTE — Telephone Encounter (Signed)
Refer to [facility] endocrine and hematology please same day if possible  [place]

## 2018-06-18 ENCOUNTER — Other Ambulatory Visit (HOSPITAL_BASED_OUTPATIENT_CLINIC_OR_DEPARTMENT_OTHER): Payer: BLUE CROSS/BLUE SHIELD

## 2018-08-09 DIAGNOSIS — E063 Autoimmune thyroiditis: Secondary | ICD-10-CM | POA: Diagnosis not present

## 2018-08-09 DIAGNOSIS — I4891 Unspecified atrial fibrillation: Secondary | ICD-10-CM | POA: Diagnosis not present

## 2018-08-09 DIAGNOSIS — E039 Hypothyroidism, unspecified: Secondary | ICD-10-CM | POA: Diagnosis not present

## 2018-08-22 ENCOUNTER — Telehealth: Payer: Self-pay

## 2018-08-22 NOTE — Telephone Encounter (Signed)
I am unable to see the note she will need to contact their office   [place]

## 2018-08-22 NOTE — Telephone Encounter (Signed)
Copied from [place] [id number]. Topic: General - Inquiry >> Aug 22, 2018  9:59 AM Scherrie Gerlach wrote: Reason for CRM: pt wants to know if you have any information from her visit at [facility] endocrinologist Dr Conard Novak. Pt declined to call their office, states she cannot get through their and prefers Dr Linus Orn to call and get the info if she does not have by now. Pt would like a call back to let her know what they found out, because she has not heard from anyone.

## 2018-09-02 NOTE — Telephone Encounter (Signed)
All thyroid labs still abnormal as of 08/09/2018  Any tests done by a specialist outside of primary care need to be followed up by the doctor that ordered it She needs to call the office as she will need follow up with them about her thyroid, as they are the specialist and once I referred her they are to be the primary people working up abnormal thyroid labs   Their number is below. Please call as this is her health as well   Pou, Sheran Fava, MD  [address], [place] [address]  [phone]  [phone] (Fax

## 2018-09-02 NOTE — Telephone Encounter (Signed)
Pt called to f/u on results from [facility] stating she called 2 weeks ago and no one contacted her. Notified pt of below that Dr. Terese Door cannot see the results and pt should contact the other office. Pt states she hates the communication between herself and [place]. She states that it is the responsibility of her PCP to provide her with the results as that is who referred her. She said she has no intention of contacting [facility] herself and it is her expectation that [place] obtain her results and notify her. She states she comes from a medication family (father was a Investment banker, corporate and 2 daughters in [place]) and this is not the type of communication she expects and she may have to change providers.

## 2018-09-04 NOTE — Telephone Encounter (Signed)
No ma'am. I will call today.

## 2018-09-04 NOTE — Telephone Encounter (Signed)
Patient has been notified

## 2018-09-04 NOTE — Telephone Encounter (Signed)
Has this patient been notified?

## 2018-10-08 DIAGNOSIS — Z6827 Body mass index (BMI) 27.0-27.9, adult: Secondary | ICD-10-CM | POA: Diagnosis not present

## 2018-10-08 DIAGNOSIS — I340 Nonrheumatic mitral (valve) insufficiency: Secondary | ICD-10-CM | POA: Diagnosis not present

## 2018-10-08 DIAGNOSIS — E038 Other specified hypothyroidism: Secondary | ICD-10-CM | POA: Diagnosis not present

## 2018-10-08 DIAGNOSIS — L538 Other specified erythematous conditions: Secondary | ICD-10-CM | POA: Diagnosis not present

## 2018-10-08 DIAGNOSIS — L570 Actinic keratosis: Secondary | ICD-10-CM | POA: Diagnosis not present

## 2018-10-08 DIAGNOSIS — L820 Inflamed seborrheic keratosis: Secondary | ICD-10-CM | POA: Diagnosis not present

## 2018-10-08 DIAGNOSIS — L821 Other seborrheic keratosis: Secondary | ICD-10-CM | POA: Diagnosis not present

## 2018-10-08 DIAGNOSIS — D225 Melanocytic nevi of trunk: Secondary | ICD-10-CM | POA: Diagnosis not present

## 2018-10-08 DIAGNOSIS — I4821 Permanent atrial fibrillation: Secondary | ICD-10-CM | POA: Diagnosis not present

## 2018-10-08 DIAGNOSIS — L853 Xerosis cutis: Secondary | ICD-10-CM | POA: Diagnosis not present

## 2018-10-08 DIAGNOSIS — L814 Other melanin hyperpigmentation: Secondary | ICD-10-CM | POA: Diagnosis not present

## 2018-10-27 ENCOUNTER — Ambulatory Visit
Admission: EM | Admit: 2018-10-27 | Discharge: 2018-10-27 | Disposition: A | Discharge status: Home / Self Care | Payer: Medicare Other | Attending: Family Medicine | Admitting: Family Medicine

## 2018-10-27 ENCOUNTER — Other Ambulatory Visit: Payer: Self-pay

## 2018-10-27 DIAGNOSIS — B9689 Other specified bacterial agents as the cause of diseases classified elsewhere: Secondary | ICD-10-CM

## 2018-10-27 DIAGNOSIS — N3001 Acute cystitis with hematuria: Secondary | ICD-10-CM | POA: Insufficient documentation

## 2018-10-27 LAB — URINALYSIS, COMPLETE (UACMP) WITH MICROSCOPIC
Bilirubin Urine: NEGATIVE
Glucose, UA: NEGATIVE mg/dL
Ketones, ur: NEGATIVE mg/dL
Nitrite: NEGATIVE
Protein, ur: 100 mg/dL — AB
Specific Gravity, Urine: <1.005 — ABNORMAL LOW (ref 1.005–1.030)
Squamous Epithelial / LPF: NONE SEEN (ref 0–5)
WBC, UA: >50 WBC/hpf (ref 0–5)
pH: 6 (ref 5.0–8.0)

## 2018-10-27 MED ORDER — CEPHALEXIN 500 MG PO CAPS: 500.0000 mg | ORAL_CAPSULE | Freq: Two times a day (BID) | ORAL | 0 refills | Status: DC

## 2018-10-27 NOTE — ED Provider Notes (Signed)
[facility]    CSN: [id number] Arrival date & time: 10/27/18  1444  History   Chief Complaint Chief Complaint  Patient presents with  . Urinary Frequency   HPI  78 year old female presents with dysuria, urinary frequency, urgency, and hematuria.  Patient reports her symptoms started abruptly today.  She reports dysuria, frequency, urgency, and hematuria.  No reports of passage of clots.  No fever.  No reports of abdominal pain.  No flank pain or back pain.  No medications or interventions tried.  No known exacerbating factors.  Patient also reports she has had some ongoing dizziness.  She is unsure if this is related.  No other associated symptoms.  No other complaints or concerns at this time  PMH, Surgical Hx, Family Hx, Social History reviewed and updated as below.  Past Medical History:  Diagnosis Date  . Atrial fibrillation, persistent   . Cancer [facility])    colon cancer 1999 s/p resection   . History of colon polyps   . Insomnia   . Low back pain   . Mitral regurgitation   . Pap smear abnormality of cervix with ASCUS favoring benign   . Sciatica   . Vitamin D deficiency     Patient Active Problem List   Diagnosis Date Noted  . Persistent atrial fibrillation 05/28/2018  . History of colon cancer 05/28/2018  . Dizziness 05/28/2018  . DDD (degenerative disc disease), lumbar 05/28/2018  . Hypothyroidism 05/28/2018  . Cough 05/28/2018  . Vitamin D deficiency 05/28/2018  . Insomnia 05/28/2018  . Lesion of oral mucosa 05/28/2018  . Valvular heart disease 05/21/2018  . Allergies 05/21/2018    Past Surgical History:  Procedure Laterality Date  . COLON RESECTION     1999 for cancer   . TONSILLECTOMY    . WISDOM TOOTH EXTRACTION      OB History   No obstetric history on file.      Home Medications    Prior to Admission medications   Medication Sig Start Date End Date Taking? Authorizing Provider  Calcium Carb-Cholecalciferol (CALCIUM PLUS VITAMIN  D3 PO) Take by mouth.   Yes [provider]  diazepam (VALIUM) 5 MG tablet Take 5 mg by mouth daily as needed for anxiety.   Yes [provider]  metoprolol succinate (TOPROL-XL) 25 MG 24 hr tablet 25 mg daily.  03/11/18  Yes [provider]  SYNTHROID 75 MCG tablet Take 75 mcg by mouth daily before breakfast.  05/06/18  Yes [provider]  XARELTO 20 MG TABS tablet Take 20 mg by mouth daily.  05/01/18  Yes [provider]  cephALEXin (KEFLEX) 500 MG capsule Take 1 capsule (500 mg total) by mouth 2 (two) times daily. 10/27/18   Coral Spikes, DO    Family History Family History  Problem Relation Age of Onset  . Heart disease Father   . Hyperlipidemia Father   . Osteopenia Sister     Social History Social History   Tobacco Use  . Smoking status: Former Research scientist (life sciences)  . Smokeless tobacco: Never Used  . Tobacco comment: former quit in 30s light smoker per pt   Substance Use Topics  . Alcohol use: Yes  . Drug use: Not Currently     Allergies   Codeine and Other   Review of Systems Review of Systems  Constitutional: Negative.   Gastrointestinal: Negative.   Genitourinary: Positive for dysuria, frequency, hematuria and urgency.  Neurological: Positive for dizziness.   Physical Exam  Triage Vital Signs ED Triage Vitals  Enc Vitals Group     BP 10/27/18 1501 113/81     Pulse Rate 10/27/18 1501 98     Resp 10/27/18 1501 16     Temp 10/27/18 1501 98.5 F (36.9 C)     Temp Source 10/27/18 1501 Oral     SpO2 10/27/18 1501 97 %     Weight 10/27/18 1457 155 lb (70.3 kg)     Height 10/27/18 1457 5\' 5"  (1.651 m)     Head Circumference --      Peak Flow --      Pain Score 10/27/18 1457 7     Pain Loc --      Pain Edu? --      Excl. in [place]? --    Updated Vital Signs BP 113/81 (BP Location: Left Arm)   Pulse 98   Temp 98.5 F (36.9 C) (Oral)   Resp 16   Ht 5\' 5"  (1.651 m)   Wt 70.3 kg   SpO2 97%   BMI 25.79 kg/m   Visual Acuity  Right Eye Distance:   Left Eye Distance:   Bilateral Distance:    Right Eye Near:   Left Eye Near:    Bilateral Near:     Physical Exam Vitals signs and nursing note reviewed.  Constitutional:      General: She is not in acute distress.    Appearance: Normal appearance.  HENT:     Head: Normocephalic and atraumatic.  Eyes:     General:        Right eye: No discharge.        Left eye: No discharge.     Conjunctiva/sclera: Conjunctivae normal.  Cardiovascular:     Rate and Rhythm: Normal rate and regular rhythm.     Heart sounds: Murmur present.  Pulmonary:     Effort: Pulmonary effort is normal.     Breath sounds: Normal breath sounds.  Abdominal:     General: There is no distension.     Palpations: Abdomen is soft.     Tenderness: There is no abdominal tenderness.  Neurological:     Mental Status: She is alert.  Psychiatric:        Mood and Affect: Mood normal.        Behavior: Behavior normal.    UC Treatments / Results  Labs (all labs ordered are listed, but only abnormal results are displayed) Labs Reviewed  URINALYSIS, COMPLETE (UACMP) WITH MICROSCOPIC - Abnormal; Notable for the following components:      Result Value   Color, Urine RED (*)    APPearance HAZY (*)    Specific Gravity, Urine <1.005 (*)    Hgb urine dipstick LARGE (*)    Protein, ur 100 (*)    Leukocytes,Ua LARGE (*)    Bacteria, UA FEW (*)    All other components within normal limits  URINE CULTURE    EKG None  Radiology No results found.  Procedures Procedures (including critical care time)  Medications Ordered in UC Medications - No data to display  Initial Impression / Assessment and Plan / UC Course  I have reviewed the triage vital signs and the nursing notes.  Pertinent labs & imaging results that were available during my care of the patient were reviewed by me and considered in my medical decision making (see chart for details).    78 year old female presents with  UTI. Sending culture. Starting on Keflex.  Final Clinical Impressions(s) / UC Diagnoses   Final diagnoses:  Acute cystitis with hematuria   Discharge Instructions   None    ED Prescriptions    Medication Sig Dispense Auth. Provider   cephALEXin (KEFLEX) 500 MG capsule Take 1 capsule (500 mg total) by mouth 2 (two) times daily. 14 capsule Coral Spikes, DO     Controlled Substance Prescriptions [place] Controlled Substance Registry consulted? Not Applicable   Coral Spikes, DO 10/27/18 1549

## 2018-10-27 NOTE — ED Triage Notes (Signed)
Patient complains of urinary urgency and frequency and painful urination that started over last 2 days.

## 2018-10-30 LAB — URINE CULTURE: Culture: >=100000 — AB

## 2018-10-31 ENCOUNTER — Telehealth (HOSPITAL_COMMUNITY): Payer: Self-pay | Admitting: Emergency Medicine

## 2018-10-31 NOTE — Telephone Encounter (Signed)
Urine culture was positive for e coli and was given keflex  at urgent care visit. Attempted to reach patient. No answer at this time.   

## 2018-11-04 ENCOUNTER — Telehealth: Payer: Self-pay | Admitting: Emergency Medicine

## 2018-11-04 ENCOUNTER — Telehealth: Payer: Self-pay

## 2018-11-04 ENCOUNTER — Telehealth: Payer: Self-pay | Admitting: Family Medicine

## 2018-11-04 MED ORDER — CEFDINIR 300 MG PO CAPS: 300.0000 mg | ORAL_CAPSULE | Freq: Two times a day (BID) | ORAL | 0 refills | Status: AC

## 2018-11-04 NOTE — Telephone Encounter (Signed)
Returned patient call.  Patient stated she had finished her antibiotic from a week ago and is still having UTI symptoms.  Patient asked [place] to call in another antibiotic.  Patient was advised that we do not refill prescriptions and if she is still having symptoms she would need to be re-evaluated either here or by her PCP.  Advised patient that the provider she saw last week will be back in the morning.  Patient stated she could not wait until tomorrow and would try to get in touch with Dr. Lacinda Axon herself.  nmw

## 2018-11-04 NOTE — Telephone Encounter (Signed)
Rx sent 

## 2018-11-04 NOTE — Telephone Encounter (Signed)
Patient called requesting a refill for Cephalexin for her UTI. Attempted to reach patient via home and cell phone numbers. VM left on cell phone to return call to urgent care.

## 2018-11-04 NOTE — Telephone Encounter (Signed)
Patient advised that medication has been sent in. [facility]

## 2018-11-04 NOTE — Telephone Encounter (Signed)
Patient called in today requesting a refill of antibiotic that she was given last week by Dr. Lacinda Axon. Patient states that she is now urinating blood. I advised patient that she would need to be seen for additional refills. Patient states that does not want to come in and would like for someone to call Dr. Lacinda Axon and send in her antibiotic refill.

## 2018-11-04 NOTE — Telephone Encounter (Signed)
Will send in additional antibiotic course. If gross hematuria persists, she needs to be re-evaluated and likely needs to see Urology.

## 2018-11-12 ENCOUNTER — Telehealth: Payer: Self-pay

## 2018-11-12 ENCOUNTER — Other Ambulatory Visit: Payer: Self-pay | Admitting: Internal Medicine

## 2018-11-12 DIAGNOSIS — N3000 Acute cystitis without hematuria: Secondary | ICD-10-CM

## 2018-11-12 NOTE — Telephone Encounter (Signed)
Copied from [place] [id number]. Topic: Appointment Scheduling - Scheduling Inquiry for Clinic >> Nov 12, 2018  2:02 PM Margot Ables wrote: Reason for CRM: Pt went to [facility]/27/2018 for blood in urine. Pt now has unpleasant stinging and burning sensation worse when urinating but there all the time. Pt has had 2 rounds of ABX from UC. She is having pressure and pain in bladder still. Pt asking if she can be treated by Dr. Terese Door. Please advise if pt needs appt.

## 2018-11-12 NOTE — Telephone Encounter (Signed)
Patient sheduled lab and office appointment

## 2018-11-12 NOTE — Telephone Encounter (Signed)
She needs to sch a visit and we need a urine dropped off   [place]

## 2018-11-13 ENCOUNTER — Other Ambulatory Visit: Payer: Medicare Other

## 2018-11-13 ENCOUNTER — Other Ambulatory Visit: Payer: Self-pay

## 2018-11-13 ENCOUNTER — Ambulatory Visit (INDEPENDENT_AMBULATORY_CARE_PROVIDER_SITE_OTHER): Payer: Medicare Other | Admitting: Internal Medicine

## 2018-11-13 DIAGNOSIS — N3000 Acute cystitis without hematuria: Secondary | ICD-10-CM | POA: Diagnosis not present

## 2018-11-13 DIAGNOSIS — E039 Hypothyroidism, unspecified: Secondary | ICD-10-CM

## 2018-11-13 DIAGNOSIS — B3731 Acute candidiasis of vulva and vagina: Secondary | ICD-10-CM

## 2018-11-13 DIAGNOSIS — N3001 Acute cystitis with hematuria: Secondary | ICD-10-CM | POA: Diagnosis not present

## 2018-11-13 DIAGNOSIS — B373 Candidiasis of vulva and vagina: Secondary | ICD-10-CM | POA: Diagnosis not present

## 2018-11-13 MED ORDER — FLUCONAZOLE 150 MG PO TABS: 150.0000 mg | ORAL_TABLET | Freq: Once | ORAL | 0 refills | Status: DC

## 2018-11-13 MED ORDER — CIPROFLOXACIN HCL 500 MG PO TABS: 500.0000 mg | ORAL_TABLET | Freq: Two times a day (BID) | ORAL | 0 refills | Status: DC

## 2018-11-13 NOTE — Progress Notes (Signed)
Telephone Note  I connected with Taylor Mccarthy  on 11/13/18 at  1:03 PM EDT by telephone and verified that I am speaking with the correct person using two identifiers.  Location patient: home Location provider:work  Persons participating in the virtual visit: patient, provider  I discussed the limitations of evaluation and management by telemedicine and the availability of in person appointments. The patient expressed understanding and agreed to proceed.   HPI: 2 weeks ago on "Sunday had red blood with urination burning, increased freq, bladder overactive, which resolved now has vaginal itching. She went to urgent care and culture urine + Ecoli UTI 10/27/18 pansensitive given Keflex bid #14 and Cefdinir 300 bid #14 still with sx's pt dropped urine sample off today wants to wait on culture results before medication initiated. She has had 2-3 x in lifetime UTI   Hypothyroidism saw [facility] endocrine with TSH 0.41, Ft4 1.46 high, TPO 25.64 they rec continue synthyroid 75 mcg qd for now      ROS: See pertinent positives and negatives per HPI.  Past Medical History:  Diagnosis Date  . Atrial fibrillation, persistent   . Cancer (HCC)    colon cancer 1999 s/p resection   . History of colon polyps   . Insomnia   . Low back pain   . Mitral regurgitation   . Pap smear abnormality of cervix with ASCUS favoring benign   . Sciatica   . Vitamin D deficiency     Past Surgical History:  Procedure Laterality Date  . COLON RESECTION     19" 99 for cancer   . TONSILLECTOMY    . WISDOM TOOTH EXTRACTION      Family History  Problem Relation Age of Onset  . Heart disease Father   . Hyperlipidemia Father   . Osteopenia Sister     SOCIAL HX: lives in [place]    Current Outpatient Medications:  .  Calcium Carb-Cholecalciferol (CALCIUM PLUS VITAMIN D3 PO), Take by mouth., Disp: , Rfl:  .  cephALEXin (KEFLEX) 500 MG capsule, Take 1 capsule (500 mg total) by mouth 2 (two) times daily., Disp: 14  capsule, Rfl: 0 .  ciprofloxacin (CIPRO) 500 MG tablet, Take 1 tablet (500 mg total) by mouth 2 (two) times daily. With food, Disp: 10 tablet, Rfl: 0 .  diazepam (VALIUM) 5 MG tablet, Take 5 mg by mouth daily as needed for anxiety., Disp: , Rfl:  .  fluconazole (DIFLUCAN) 150 MG tablet, Take 1 tablet (150 mg total) by mouth once for 1 dose., Disp: 1 tablet, Rfl: 0 .  metoprolol succinate (TOPROL-XL) 25 MG 24 hr tablet, 25 mg daily. , Disp: , Rfl: 3 .  SYNTHROID 75 MCG tablet, Take 75 mcg by mouth daily before breakfast. , Disp: , Rfl: 7 .  XARELTO 20 MG TABS tablet, Take 20 mg by mouth daily. , Disp: , Rfl: 3  EXAM: telephone   VITALS per patient if applicable:  GENERAL: alert, oriented, appears well and in no acute distress  PSYCH/NEURO: pleasant and cooperative, no obvious depression or anxiety, speech and thought processing grossly intact  ASSESSMENT AND PLAN:  Discussed the following assessment and plan:  Acute cystitis with hematuria - Plan: ciprofloxacin (CIPRO) 500 MG tablet  Yeast vaginitis - Plan: fluconazole (DIFLUCAN) 150 MG tablet UA and culture pending pts wants to wait on both Rx above until results are back  If + will rx  cipro 500 mg bid x 5 days and Diflucan  If not better consider urogyn/urology  Hypothyroidism, unspecified type -f/u [facility] endocrine  Cont same dose of medication synthyroid 75 mcg qd      I discussed the assessment and treatment plan with the patient. The patient was provided an opportunity to ask questions and all were answered. The patient agreed with the plan and demonstrated an understanding of the instructions.   The patient was advised to call back or seek an in-person evaluation if the symptoms worsen or if the condition fails to improve as anticipated.  Time spent 15 minutes   Delorise Jackson, MD

## 2018-11-14 LAB — URINALYSIS, ROUTINE W REFLEX MICROSCOPIC
Bacteria, UA: NONE SEEN /HPF
Bilirubin Urine: NEGATIVE
Glucose, UA: NEGATIVE
Hgb urine dipstick: NEGATIVE
Hyaline Cast: NONE SEEN /LPF
Ketones, ur: NEGATIVE
Nitrite: NEGATIVE
Protein, ur: NEGATIVE
Specific Gravity, Urine: 1.016 (ref 1.001–1.03)
Squamous Epithelial / HPF: NONE SEEN /HPF (ref <=5)
WBC, UA: NONE SEEN /HPF (ref 0–5)
pH: 6.5 (ref 5.0–8.0)

## 2018-11-14 LAB — URINE CULTURE
MICRO NUMBER:: 397353
SPECIMEN QUALITY:: ADEQUATE

## 2018-11-15 ENCOUNTER — Encounter: Payer: Self-pay | Admitting: Internal Medicine

## 2018-11-15 ENCOUNTER — Telehealth: Payer: Self-pay | Admitting: Internal Medicine

## 2018-11-15 ENCOUNTER — Other Ambulatory Visit: Payer: Self-pay | Admitting: Internal Medicine

## 2018-11-15 DIAGNOSIS — B373 Candidiasis of vulva and vagina: Secondary | ICD-10-CM

## 2018-11-15 DIAGNOSIS — B3731 Acute candidiasis of vulva and vagina: Secondary | ICD-10-CM

## 2018-11-15 MED ORDER — FLUCONAZOLE 150 MG PO TABS: 150.0000 mg | ORAL_TABLET | Freq: Once | ORAL | 0 refills | Status: AC

## 2018-11-15 NOTE — Telephone Encounter (Signed)
Call pharmacy and cancel Diflucan pt declines for suspected yeast infection after antibiotics   [place]

## 2018-11-15 NOTE — Telephone Encounter (Signed)
Pharmacy has been notified.

## 2019-01-07 DIAGNOSIS — H524 Presbyopia: Secondary | ICD-10-CM | POA: Diagnosis not present

## 2019-01-07 DIAGNOSIS — H2513 Age-related nuclear cataract, bilateral: Secondary | ICD-10-CM | POA: Diagnosis not present

## 2019-01-09 ENCOUNTER — Telehealth: Payer: Self-pay

## 2019-01-09 NOTE — Telephone Encounter (Signed)

## 2019-01-14 ENCOUNTER — Ambulatory Visit: Payer: Medicare Other | Admitting: Cardiovascular Disease

## 2019-01-16 ENCOUNTER — Ambulatory Visit: Payer: Medicare Other | Admitting: Cardiovascular Disease

## 2019-02-27 ENCOUNTER — Ambulatory Visit (INDEPENDENT_AMBULATORY_CARE_PROVIDER_SITE_OTHER): Payer: Medicare Other | Admitting: Cardiovascular Disease

## 2019-02-27 ENCOUNTER — Other Ambulatory Visit: Payer: Self-pay

## 2019-02-27 ENCOUNTER — Encounter: Payer: Self-pay | Admitting: Cardiovascular Disease

## 2019-02-27 VITALS — BP 136/94 | HR 86 | Temp 97.4°F | Ht 66.5 in | Wt 167.2 lb

## 2019-02-27 DIAGNOSIS — I4819 Other persistent atrial fibrillation: Secondary | ICD-10-CM | POA: Diagnosis not present

## 2019-02-27 DIAGNOSIS — I4821 Permanent atrial fibrillation: Secondary | ICD-10-CM | POA: Diagnosis not present

## 2019-02-27 DIAGNOSIS — I38 Endocarditis, valve unspecified: Secondary | ICD-10-CM | POA: Diagnosis not present

## 2019-02-27 MED ORDER — METOPROLOL SUCCINATE ER 50 MG PO TB24: 50.0000 mg | ORAL_TABLET | Freq: Every day | ORAL | 3 refills | Status: DC

## 2019-02-27 NOTE — Progress Notes (Signed)
Cardiology Office Note   Date:  02/27/2019   ID:  Taylor Mccarthy [birth year], MRN [id number]  PCP:  McLean-Scocuzza, Taylor Glow, MD  Cardiologist:   Taylor Sacramento, MD   Chief Complaint  Patient presents with  . office visit    NEW-Afib and Mitral Regurgitation      History of Present Illness: Taylor Mccarthy is a 78 y.o. female who was referred by Dr. Terese Mccarthy for evaluation of persistent atrial fibrillation and mitral regurgitation. She has known history of hypothyroidism and permanent atrial fibrillation on long-term anticoagulation with Xarelto.  She has been followed in the past by Dr. Wallace Mccarthy.  She had echocardiogram done in December 2018 which with an EF of 55 to 60%, mild LVH, severely dilated left atrium, severely dilated right atrium, moderate to severe mitral regurgitation and severe tricuspid regurgitation.  Estimated pulmonary artery pressure was between 37 to 50 mmHg. She reports having atrial fibrillation for at least 8 years.  She had cardioversion done twice which was not successful and that she has been treated with rate control and anticoagulation.  She denies any chest pain or shortness of breath.  Her biggest complaint seems to be dizziness and lightheadedness without syncope or presyncope.  She is a lifelong non-smoker. She had labs done last year which showed elevated hemoglobin at 17.1.  Renal function was normal.  She has also history of colon cancer.  Past Medical History:  Diagnosis Date  . Atrial fibrillation, persistent   . Cancer [facility])    colon cancer 1999 s/p resection   . History of colon polyps   . Insomnia   . Low back pain   . Mitral regurgitation   . Pap smear abnormality of cervix with ASCUS favoring benign   . Sciatica   . Vitamin D deficiency     Past Surgical History:  Procedure Laterality Date  . COLON RESECTION     1999 for cancer   . TONSILLECTOMY    . WISDOM TOOTH EXTRACTION       Current Outpatient Medications   Medication Sig Dispense Refill  . Calcium Carb-Cholecalciferol (CALCIUM PLUS VITAMIN D3 PO) Take by mouth.    . diazepam (VALIUM) 5 MG tablet Take 5 mg by mouth daily as needed for anxiety.    . metoprolol succinate (TOPROL-XL) 25 MG 24 hr tablet 25 mg daily.   3  . SYNTHROID 75 MCG tablet Take 75 mcg by mouth daily before breakfast.   7  . XARELTO 20 MG TABS tablet Take 20 mg by mouth daily.   3   No current facility-administered medications for this visit.     Allergies:   Morpholine salicylate, Codeine, and Other    Social History:  The patient  reports that she has quit smoking. She has never used smokeless tobacco. She reports current alcohol use. She reports previous drug use.   Family History:  The patient's family history includes Heart disease in her father; Hyperlipidemia in her father; Osteopenia in her sister.    ROS:  Please see the history of present illness.   Otherwise, review of systems are positive for none.   All other systems are reviewed and negative.    PHYSICAL EXAM: VS:  BP (!) 136/94 (BP Location: Right Arm, Patient Position: Sitting, Cuff Size: Normal)   Pulse 86   Temp (!) 97.4 F (36.3 C)   Ht 5' 6.5" (1.689 m)   Wt 167 lb 4 oz (75.9 kg)   SpO2 95%  BMI 26.59 kg/m  , BMI Body mass index is 26.59 kg/m. GEN: Well nourished, well developed, in no acute distress  HEENT: normal  Neck: no JVD, carotid bruits, or masses Cardiac: Irregularly irregular; no murmurs, rubs, or gallops,no edema  Respiratory:  clear to auscultation bilaterally, normal work of breathing GI: soft, nontender, nondistended, + BS MS: no deformity or atrophy  Skin: warm and dry, no rash Neuro:  Strength and sensation are intact Psych: euthymic mood, full affect   EKG:  EKG is ordered today. The ekg ordered today demonstrates atrial fibrillation with ventricular rate of 86 bpm.  Frequent PVCs.   Recent Labs: 06/03/2018: ALT 26; BUN 18; Creatinine, Ser 0.77; Hemoglobin 17.1;  Platelets 259; Potassium 4.2; Sodium 139; TSH 3.640    Lipid Panel    Component Value Date/Time   CHOL 198 06/03/2018 1026   TRIG 123 06/03/2018 1026   HDL 75 06/03/2018 1026   LDLCALC 98 06/03/2018 1026      Wt Readings from Last 3 Encounters:  02/27/19 167 lb 4 oz (75.9 kg)  10/27/18 155 lb (70.3 kg)  05/28/18 160 lb 1.9 oz (72.6 kg)       PAD Screen 02/27/2019  Previous PAD dx? No  Previous surgical procedure? No  Pain with walking? No  Feet/toe relief with dangling? No  Painful, non-healing ulcers? No  Extremities discolored? No      ASSESSMENT AND PLAN:  1.  Permanent atrial fibrillation: She also has PVCs.  Ventricular rate does not seem to be optimally controlled which might be contributing to her dizziness.  I elected to increase Toprol to 50 mg once daily. Given her complaints of dizziness, I am going to recheck routine labs including CBC and basic metabolic profile.  She did have elevated hemoglobin last year of unclear etiology.  If this is worse, she might require hematology evaluation.  2.  Mitral regurgitation and tricuspid regurgitation: This was described on most recent echocardiogram in December 2018.  I requested a follow-up echocardiogram.    Disposition:   FU with me in 4 months  Signed,  Taylor Sacramento, MD  02/27/2019 2:53 PM    [place]

## 2019-02-27 NOTE — Patient Instructions (Signed)
Medication Instructions:  Your physician has recommended you make the following change in your medication:   INCREASE Metoprolol to 50mg  daily. An Rx has been sent to your pharmacy   If you need a refill on your cardiac medications before your next appointment, please call your pharmacy.   Lab work: Risk manager today If you have labs (blood work) drawn today and your tests are completely normal, you will receive your results only by: Marland Kitchen MyChart Message (if you have MyChart) OR . A paper copy in the mail If you have any lab test that is abnormal or we need to change your treatment, we will call you to review the results.  Testing/Procedures: Your physician has requested that you have an echocardiogram. Echocardiography is a painless test that uses sound waves to create images of your heart. It provides your doctor with information about the size and shape of your heart and how well your heart's chambers and valves are working. This procedure takes approximately one hour. There are no restrictions for this procedure.    Follow-Up: At [facility], you and your health needs are our priority.  As part of our continuing mission to provide you with exceptional heart care, we have created designated Provider Care Teams.  These Care Teams include your primary Cardiologist (physician) and Advanced Practice Providers (APPs -  Physician Assistants and Nurse Practitioners) who all work together to provide you with the care you need, when you need it. You will need a follow up appointment in 4 months.  Please call our office 2 months in advance to schedule this appointment.  You may see Dr.Arida  or one of the following Advanced Practice Providers on your designated Care Team:   Murray Hodgkins, NP Christell Faith, PA-C . Marrianne Mood, PA-C

## 2019-02-28 LAB — CBC WITH DIFFERENTIAL/PLATELET
Basophils Absolute: 0.1 10*3/uL (ref 0.0–0.2)
Basos: 1 %
EOS (ABSOLUTE): 0.2 10*3/uL (ref 0.0–0.4)
Eos: 2 %
Hematocrit: 45.1 % (ref 34.0–46.6)
Hemoglobin: 15.7 g/dL (ref 11.1–15.9)
Immature Grans (Abs): 0 10*3/uL (ref 0.0–0.1)
Immature Granulocytes: 0 %
Lymphocytes Absolute: 2.1 10*3/uL (ref 0.7–3.1)
Lymphs: 29 %
MCH: 35.8 pg — ABNORMAL HIGH (ref 26.6–33.0)
MCHC: 34.8 g/dL (ref 31.5–35.7)
MCV: 103 fL — ABNORMAL HIGH (ref 79–97)
Monocytes Absolute: 0.5 10*3/uL (ref 0.1–0.9)
Monocytes: 7 %
Neutrophils Absolute: 4.4 10*3/uL (ref 1.4–7.0)
Neutrophils: 61 %
Platelets: 232 10*3/uL (ref 150–450)
RBC: 4.39 x10E6/uL (ref 3.77–5.28)
RDW: 12.6 % (ref 11.7–15.4)
WBC: 7.2 10*3/uL (ref 3.4–10.8)

## 2019-02-28 LAB — BASIC METABOLIC PANEL
BUN/Creatinine Ratio: 23 (ref 12–28)
BUN: 15 mg/dL (ref 8–27)
CO2: 19 mmol/L — ABNORMAL LOW (ref 20–29)
Calcium: 9.8 mg/dL (ref 8.7–10.3)
Chloride: 103 mmol/L (ref 96–106)
Creatinine, Ser: 0.64 mg/dL (ref 0.57–1.00)
GFR calc Af Amer: 97 mL/min/{1.73_m2} (ref >59)
GFR calc non Af Amer: 85 mL/min/{1.73_m2} (ref >59)
Glucose: 81 mg/dL (ref 65–99)
Potassium: 4.7 mmol/L (ref 3.5–5.2)
Sodium: 142 mmol/L (ref 134–144)

## 2019-03-27 ENCOUNTER — Ambulatory Visit (INDEPENDENT_AMBULATORY_CARE_PROVIDER_SITE_OTHER): Payer: Medicare Other

## 2019-03-27 ENCOUNTER — Other Ambulatory Visit: Payer: Self-pay

## 2019-03-27 DIAGNOSIS — I38 Endocarditis, valve unspecified: Secondary | ICD-10-CM

## 2019-04-21 ENCOUNTER — Ambulatory Visit (INDEPENDENT_AMBULATORY_CARE_PROVIDER_SITE_OTHER): Payer: Medicare Other

## 2019-04-21 ENCOUNTER — Other Ambulatory Visit: Payer: Self-pay

## 2019-04-21 DIAGNOSIS — Z0000 Encounter for general adult medical examination without abnormal findings: Secondary | ICD-10-CM

## 2019-04-21 NOTE — Progress Notes (Signed)
Subjective:   Taylor Mccarthy is a 78 y.o. female who presents for an Initial Medicare Annual Wellness Visit.  Review of Systems    No ROS.  Medicare Wellness Virtual Visit.  Visual/audio telehealth visit, UTA vital signs.   See social history for additional risk factors.    Cardiac Risk Factors include: advanced age (>23men, >88 women)     Objective:    Today's Vitals   There is no height or weight on file to calculate BMI.  Advanced Directives 04/21/2019 10/27/2018  Does Patient Have a Medical Advance Directive? Yes No  Type of Paramedic of [place];Living will -  Does patient want to make changes to medical advance directive? No - Patient declined -  Copy of [place] in Chart? No - copy requested -    Current Medications (verified) Outpatient Encounter Medications as of 04/21/2019  Medication Sig  . Calcium Carb-Cholecalciferol (CALCIUM PLUS VITAMIN D3 PO) Take by mouth.  . diazepam (VALIUM) 5 MG tablet Take 5 mg by mouth daily as needed for anxiety.  . metoprolol succinate (TOPROL-XL) 50 MG 24 hr tablet Take 1 tablet (50 mg total) by mouth daily. (Patient taking differently: Take 50 mg by mouth daily. )  . SYNTHROID 75 MCG tablet Take 75 mcg by mouth daily before breakfast.   . XARELTO 20 MG TABS tablet Take 20 mg by mouth daily.    No facility-administered encounter medications on file as of 04/21/2019.     Allergies (verified) Morpholine salicylate, Codeine, and Other   History: Past Medical History:  Diagnosis Date  . Atrial fibrillation, persistent   . Cancer [facility])    colon cancer 1999 s/p resection   . History of colon polyps   . Insomnia   . Low back pain   . Mitral regurgitation   . Pap smear abnormality of cervix with ASCUS favoring benign   . Sciatica   . Vitamin D deficiency    Past Surgical History:  Procedure Laterality Date  . COLON RESECTION     1999 for cancer   . TONSILLECTOMY    . WISDOM TOOTH  EXTRACTION     Family History  Problem Relation Age of Onset  . Heart disease Father   . Hyperlipidemia Father   . Osteopenia Sister    Social History   Socioeconomic History  . Marital status: Divorced    Spouse name: Not on file  . Number of children: Not on file  . Years of education: Not on file  . Highest education level: Not on file  Occupational History  . Not on file  Social Needs  . Financial resource strain: Not on file  . Food insecurity    Worry: Not on file    Inability: Not on file  . Transportation needs    Medical: Not on file    Non-medical: Not on file  Tobacco Use  . Smoking status: Former Research scientist (life sciences)  . Smokeless tobacco: Never Used  . Tobacco comment: former quit in 30s light smoker per pt   Substance and Sexual Activity  . Alcohol use: Yes  . Drug use: Not Currently  . Sexual activity: Not Currently  Lifestyle  . Physical activity    Days per week: Not on file    Minutes per session: Not on file  . Stress: Not on file  Relationships  . Social Herbalist on phone: Not on file    Gets together: Not on file  Attends religious service: Not on file    Active member of club or organization: Not on file    Attends meetings of clubs or organizations: Not on file    Relationship status: Not on file  Other Topics Concern  . Not on file  Social History Narrative   Divorced    Secretary/administrator grad    Moved from [place] to [place]   Former Office manager    No guns   Wears seat belts     Tobacco Counseling Counseling given: Not Answered Comment: former quit in 70s light smoker per pt    Clinical Intake:  Pre-visit preparation completed: Yes        Diabetes: No  How often do you need to have someone help you when you read instructions, pamphlets, or other written materials from your doctor or pharmacy?: 1 - Never  Interpreter Needed?: No      Activities of Daily Living In your present state of health,  do you have any difficulty performing the following activities: 04/21/2019  Hearing? N  Vision? N  Difficulty concentrating or making decisions? N  Walking or climbing stairs? N  Dressing or bathing? N  Doing errands, shopping? N  Preparing Food and eating ? N  Using the Toilet? N  In the past six months, have you accidently leaked urine? Y  Comment Managed with a daily brief  Do you have problems with loss of bowel control? N  Managing your Medications? N  Managing your Finances? N  Housekeeping or managing your Housekeeping? N  Some recent data might be hidden     Immunizations and Health Maintenance Immunization History  Administered Date(s) Administered  . Influenza, High Dose Seasonal PF 04/26/2018  . Pneumococcal Polysaccharide-23 05/24/2017   Health Maintenance Due  Topic Date Due  . DEXA SCAN  09/19/2003    Patient Care Team: McLean-Scocuzza, Nino Glow, MD as PCP - General (Internal Medicine)  Indicate any recent Medical Services you may have received from other than [facility] providers in the past year (date may be approximate).     Assessment:   This is a routine wellness examination for Taylor Mccarthy.  I connected with patient 04/21/19 at 10:30 AM EDT by an audio enabled telemedicine application and verified that I am speaking with the correct person using two identifiers. Patient stated full name and DOB. Patient gave permission to continue with virtual visit. Patient's location was at home and Nurse's location was at [place] office.   Health Maintenance Due: -Influenza vaccine 2020- discussed; to be completed in season with doctor or local pharmacy.   -Tdap- discussed; to be completed with doctor in visit or local pharmacy.  -Dexa Scan- scheduled.  Update all pending maintenance due as appropriate.   See completed HM at the end of note.   Eye: Visual acuity not assessed. Virtual visit. Followed by their ophthalmologist every 12 months.   Dental: Dentures- yes   Hearing: Demonstrates normal hearing during visit.  Safety:  Patient feels safe at home- yes Patient does have smoke detectors at home- yes Patient does wear sunscreen or protective clothing when in direct sunlight - yes Patient does wear seat belt when in a moving vehicle - yes Patient drives- yes Adequate lighting in walkways free from debris- yes Grab bars and handrails used as appropriate- yes Ambulates with as assistive device prn- cane  Cell phone on person when ambulating outside of the home- yes  Social: Alcohol intake - yes  Smoking history- former    Smokers in home? none Illicit drug use? none  Depression: PHQ 2 &9 complete. See screening below. Denies irritability, anhedonia, sadness/tearfullness.  Stable.   Falls: See screening below.    Medication: Taking as directed and without issues.  Notes cardiologist has changed medication Toprol 50mg  to 100mg .   Covid-19: Precautions and sickness symptoms discussed. Wears mask, social distancing, hand hygiene as appropriate.   Activities of Daily Living Patient denies needing assistance with: household chores, feeding themselves, getting from bed to chair, getting to the toilet, bathing/showering, dressing, managing money, or preparing meals.   Memory: Patient is alert. Patient denies difficulty focusing or concentrating. Correctly identified the president of the [place], season and recall. Patient likes to read, play computer games, complete puzzles for brain stimulation.  BMI- discussed the importance of a healthy diet, water intake and the benefits of aerobic exercise.  Educational material provided.  Physical activity- no routine. Encouraged to walk for exercise.   Diet:  Regular diet Water: good intake Caffeine: no  Advanced Directive: End of life planning; Advance aging; Advanced directives discussed.  Copy of current HCPOA/Living Will requested.    Other Providers Patient Care Team: McLean-Scocuzza,  Nino Glow, MD as PCP - General (Internal Medicine)   Cardiologist- Kathlyn Sacramento  Hearing/Vision screen  Hearing Screening   125Hz  250Hz  500Hz  1000Hz  2000Hz  3000Hz  4000Hz  6000Hz  8000Hz   Right ear:           Left ear:           Comments: Patient is able to hear conversational tones without difficulty.  No issues reported.   Vision Screening Comments: Visual acuity not assessed, virtual visit.  They have seen their ophthalmologist in the last 12 months.    Dietary issues and exercise activities discussed: Current Exercise Habits: The patient does not participate in regular exercise at present  Goals      Patient Stated   . Increase physical activity (pt-stated)     I want to walk more      Depression Screen PHQ 2/9 Scores 04/21/2019 05/28/2018  PHQ - 2 Score 0 0    Fall Risk Fall Risk  04/21/2019 05/28/2018  Falls in the past year? 0 No   Timed Get Up and Go Performed no, virtual visit  Cognitive Function:     6CIT Screen 04/21/2019  What Year? 0 points  What month? 0 points  What time? 0 points  Count back from 20 0 points  Months in reverse 0 points  Repeat phrase 0 points  Total Score 0    Screening Tests Health Maintenance  Topic Date Due  . DEXA SCAN  09/19/2003  . INFLUENZA VACCINE  10/29/2019 (Originally 03/01/2019)  . TETANUS/TDAP  04/20/2020 (Originally 09/18/1959)  . PNA vac Low Risk Adult  Completed    Plan:    Keep all routine maintenance appointments.   Follow up 05/07/19 @ 10:30 audio visit.  Medicare Attestation I have personally reviewed: The patient's medical and social history Their use of alcohol, tobacco or illicit drugs Their current medications and supplements The patient's functional ability including ADLs,fall risks, home safety risks, cognitive, and hearing and visual impairment Diet and physical activities Evidence for depression   In addition, I have reviewed and discussed with patient certain preventive protocols, quality  metrics, and best practice recommendations. A written personalized care plan for preventive services as well as general preventive health recommendations were provided to patient via mail.     Lynder Parents  L, LPN   [id number]

## 2019-04-21 NOTE — Patient Instructions (Addendum)
  Ms. Fergerson , Thank you for taking time to come for your Medicare Wellness Visit. I appreciate your ongoing commitment to your health goals. Please review the following plan we discussed and let me know if I can assist you in the future.   These are the goals we discussed: Goals      Patient Stated   . Increase physical activity (pt-stated)     I want to walk more       This is a list of the screening recommended for you and due dates:  Health Maintenance  Topic Date Due  . DEXA scan (bone density measurement)  09/19/2003  . Flu Shot  10/29/2019*  . Tetanus Vaccine  04/20/2020*  . Pneumonia vaccines  Completed  *Topic was postponed. The date shown is not the original due date.

## 2019-04-24 DIAGNOSIS — Z23 Encounter for immunization: Secondary | ICD-10-CM | POA: Diagnosis not present

## 2019-04-25 ENCOUNTER — Other Ambulatory Visit: Payer: Self-pay | Admitting: Internal Medicine

## 2019-04-25 MED ORDER — SYNTHROID 75 MCG PO TABS: 75.0000 ug | ORAL_TABLET | Freq: Every day | ORAL | 3 refills | Status: DC

## 2019-04-25 NOTE — Telephone Encounter (Signed)
Medication Refill - Medication: SYNTHROID 75 MCG tablet  Has the patient contacted their pharmacy? Yes - RX is expired and they need to hear from provider.  Pt would like to have a year supply sent in.  Pt states that she only has two pills left. (Agent: If no, request that the patient contact the pharmacy for the refill.) (Agent: If yes, when and what did the pharmacy advise?)  Preferred Pharmacy (with phone number or street name):  [facility], [place] [phone] (Phone) [phone] (Fax)   Agent: Please be advised that RX refills may take up to 3 business days. We ask that you follow-up with your pharmacy.

## 2019-04-25 NOTE — Telephone Encounter (Signed)
Requested medication (s) are due for refill today: yes  Requested medication (s) are on the active medication list: yes  Last refill:  05/28/2018  Future visit scheduled: yes  Notes to clinic:  Patient only has 2 pills left and would like a year suppy   Requested Prescriptions  Pending Prescriptions Disp Refills   SYNTHROID 75 MCG tablet  7    Sig: Take 1 tablet (75 mcg total) by mouth daily before breakfast.     Endocrinology:  Hypothyroid Agents Failed - 04/25/2019 10:27 AM      Failed - TSH needs to be rechecked within 3 months after an abnormal result. Refill until TSH is due.      Passed - TSH in normal range and within 360 days    TSH  Date Value Ref Range Status  06/03/2018 3.640 0.450 - 4.500 uIU/mL Final         Passed - Valid encounter within last 12 months    Recent Outpatient Visits          5 months ago Acute cystitis with hematuria   [place] McLean-Scocuzza, Nino Glow, MD   11 months ago Persistent atrial fibrillation   [facility] McLean-Scocuzza, Nino Glow, MD      Future Appointments            In 1 week McLean-Scocuzza, Nino Glow, MD [place], [place]   In 2 months Arida, Mertie Clause, MD [facility]   In 12 months O'Brien-Blaney, Bryson Corona, LPN [place], [place]

## 2019-05-02 ENCOUNTER — Other Ambulatory Visit: Payer: Self-pay

## 2019-05-02 ENCOUNTER — Other Ambulatory Visit: Payer: Self-pay | Admitting: Cardiovascular Disease

## 2019-05-02 MED ORDER — METOPROLOL SUCCINATE ER 50 MG PO TB24: 50.0000 mg | ORAL_TABLET | Freq: Every day | ORAL | 1 refills | Status: DC

## 2019-05-02 MED ORDER — XARELTO 20 MG PO TABS: 20.0000 mg | ORAL_TABLET | Freq: Every day | ORAL | 2 refills | Status: DC

## 2019-05-02 MED ORDER — XARELTO 20 MG PO TABS: 20.0000 mg | ORAL_TABLET | Freq: Every day | ORAL | 5 refills | Status: DC

## 2019-05-02 NOTE — Telephone Encounter (Signed)
°*  STAT* If patient is at the pharmacy, call can be transferred to refill team.   1. Which medications need to be refilled? (please list name of each medication and dose if known)  Metoprolol succinate 50 MG 1 daily Xarelto 20 MG 1 daily  2. Which pharmacy/location (including street and city if local pharmacy) is medication to be sent to? [facility] [phone]  3. Do they need a 30 day or 90 day supply? 30 day

## 2019-05-02 NOTE — Telephone Encounter (Signed)
Xarelto 20mg  refill request received; pt is 78 years old, weight-75.9kg, Crea-0.64 on 02/27/2019, last seen by Dr. Fletcher Anon on 02/27/2019, [place], CrCl-48ml/min; Dose is appropriate based on dosing criteria. Will send in refill to requested pharmacy.

## 2019-05-02 NOTE — Telephone Encounter (Signed)
Age 78, weight 75.9kg, SCr 0.64 on 02/27/19, CrCl 34mL/min. Last OV July 2020, afib indication

## 2019-05-02 NOTE — Addendum Note (Signed)
Addended by: SUPPLE, MEGAN E on: 05/02/2019 10:20 AM   Modules accepted: Orders

## 2019-05-02 NOTE — Telephone Encounter (Signed)
Please review for refill, Thanks !  

## 2019-05-02 NOTE — Telephone Encounter (Signed)
Refill request for Xarelto  Requested Prescriptions   Signed Prescriptions Disp Refills  . metoprolol succinate (TOPROL-XL) 50 MG 24 hr tablet 30 tablet 1    Sig: Take 1 tablet (50 mg total) by mouth daily.    Authorizing Provider: Kathlyn Sacramento A    Ordering User: Raelene Bott, BRANDY L

## 2019-05-07 ENCOUNTER — Ambulatory Visit (INDEPENDENT_AMBULATORY_CARE_PROVIDER_SITE_OTHER): Payer: Medicare Other | Admitting: Internal Medicine

## 2019-05-07 ENCOUNTER — Other Ambulatory Visit: Payer: Self-pay

## 2019-05-07 ENCOUNTER — Encounter: Payer: Self-pay | Admitting: Internal Medicine

## 2019-05-07 VITALS — Ht 66.5 in | Wt 155.0 lb

## 2019-05-07 DIAGNOSIS — R269 Unspecified abnormalities of gait and mobility: Secondary | ICD-10-CM | POA: Insufficient documentation

## 2019-05-07 DIAGNOSIS — E2839 Other primary ovarian failure: Secondary | ICD-10-CM | POA: Diagnosis not present

## 2019-05-07 DIAGNOSIS — F329 Major depressive disorder, single episode, unspecified: Secondary | ICD-10-CM | POA: Diagnosis not present

## 2019-05-07 DIAGNOSIS — R42 Dizziness and giddiness: Secondary | ICD-10-CM

## 2019-05-07 DIAGNOSIS — E063 Autoimmune thyroiditis: Secondary | ICD-10-CM | POA: Diagnosis not present

## 2019-05-07 DIAGNOSIS — R32 Unspecified urinary incontinence: Secondary | ICD-10-CM | POA: Diagnosis not present

## 2019-05-07 DIAGNOSIS — F39 Unspecified mood [affective] disorder: Secondary | ICD-10-CM | POA: Insufficient documentation

## 2019-05-07 DIAGNOSIS — Z1231 Encounter for screening mammogram for malignant neoplasm of breast: Secondary | ICD-10-CM | POA: Diagnosis not present

## 2019-05-07 DIAGNOSIS — E038 Other specified hypothyroidism: Secondary | ICD-10-CM | POA: Diagnosis not present

## 2019-05-07 DIAGNOSIS — F32A Depression, unspecified: Secondary | ICD-10-CM

## 2019-05-07 DIAGNOSIS — Z13220 Encounter for screening for lipoid disorders: Secondary | ICD-10-CM

## 2019-05-07 MED ORDER — SERTRALINE HCL 25 MG PO TABS: 25.0000 mg | ORAL_TABLET | Freq: Every day | ORAL | 3 refills | Status: DC

## 2019-05-07 NOTE — Patient Instructions (Addendum)
Think about colonoscopy 2021 **  We have medications for overactive bladder if you want to consider this as well   Mirabegron extended-release tablets What is this medicine? MIRABEGRON (MIR a BEG ron) is used to treat overactive bladder. This medicine reduces the amount of bathroom visits. It may also help to control wetting accidents. It may be used alone, but sometimes may be given with other treatments. This medicine may be used for other purposes; ask your health care provider or pharmacist if you have questions. COMMON BRAND NAME(S): Myrbetriq What should I tell my health care provider before I take this medicine? They need to know if you have any of these conditions:  high blood pressure  kidney disease  liver disease  problems urinating  prostate disease  an unusual or allergic reaction to mirabegron, other medicines, foods, dyes, or preservatives  pregnant or trying to get pregnant  breast-feeding How should I use this medicine? Take this medicine by mouth with a glass of water. Follow the directions on the prescription label. Do not cut, crush or chew this medicine. You can take it with or without food. If it upsets your stomach, take it with food. Take your medicine at regular intervals. Do not take it more often than directed. Do not stop taking except on your doctor's advice. Talk to your pediatrician regarding the use of this medicine in children. Special care may be needed. Overdosage: If you think you have taken too much of this medicine contact a poison control center or emergency room at once. NOTE: This medicine is only for you. Do not share this medicine with others. What if I miss a dose? If you miss a dose, take it as soon as you can. If it is almost time for your next dose, take only that dose. Do not take double or extra doses. What may interact with this medicine?  codeine  desipramine  digoxin  flecainide  MAOIs like Carbex, Eldepryl, Marplan,  Nardil, and Parnate  methadone  metoprolol  pimozide  propafenone  thioridazine  warfarin This list may not describe all possible interactions. Give your health care provider a list of all the medicines, herbs, non-prescription drugs, or dietary supplements you use. Also tell them if you smoke, drink alcohol, or use illegal drugs. Some items may interact with your medicine. What should I watch for while using this medicine? Visit your doctor or health care professional for regular checks on your progress. Check your blood pressure as directed. Ask your doctor or health care professional what your blood pressure should be and when you should contact him or her. You may need to limit your intake of tea, coffee, caffeinated sodas, or alcohol. These drinks may make your symptoms worse. What side effects may I notice from receiving this medicine? Side effects that you should report to your doctor or health care professional as soon as possible:  allergic reactions like skin rash, itching or hives, swelling of the face, lips, or tongue  high blood pressure  fast, irregular heartbeat  redness, blistering, peeling or loosening of the skin, including inside the mouth  signs of infection like fever or chills; pain or difficulty passing urine  trouble passing urine or change in the amount of urine Side effects that usually do not require medical attention (report to your doctor or health care professional if they continue or are bothersome):  constipation  dry mouth  headache  runny nose  stomach upset This list may not describe all possible  side effects. Call your doctor for medical advice about side effects. You may report side effects to FDA at 1-800-FDA-1088. Where should I keep my medicine? Keep out of the reach of children. Store at room temperature between 15 and 30 degrees C (59 and 86 degrees F). Throw away any unused medicine after the expiration date. NOTE: This sheet is  a summary. It may not cover all possible information. If you have questions about this medicine, talk to your doctor, pharmacist, or health care provider.  2020 Elsevier/Gold Standard (2016-12-07 11:33:21)  Kegel Exercises  Kegel exercises can help strengthen your pelvic floor muscles. The pelvic floor is a group of muscles that support your rectum, small intestine, and bladder. In females, pelvic floor muscles also help support the womb (uterus). These muscles help you control the flow of urine and stool. Kegel exercises are painless and simple, and they do not require any equipment. Your provider may suggest Kegel exercises to:  Improve bladder and bowel control.  Improve sexual response.  Improve weak pelvic floor muscles after surgery to remove the uterus (hysterectomy) or pregnancy (females).  Improve weak pelvic floor muscles after prostate gland removal or surgery (males). Kegel exercises involve squeezing your pelvic floor muscles, which are the same muscles you squeeze when you try to stop the flow of urine or keep from passing gas. The exercises can be done while sitting, standing, or lying down, but it is best to vary your position. Exercises How to do Kegel exercises: 1. Squeeze your pelvic floor muscles tight. You should feel a tight lift in your rectal area. If you are a female, you should also feel a tightness in your vaginal area. Keep your stomach, buttocks, and legs relaxed. 2. Hold the muscles tight for up to 10 seconds. 3. Breathe normally. 4. Relax your muscles. 5. Repeat as told by your health care provider. Repeat this exercise daily as told by your health care provider. Continue to do this exercise for at least 4-6 weeks, or for as long as told by your health care provider. You may be referred to a physical therapist who can help you learn more about how to do Kegel exercises. Depending on your condition, your health care provider may recommend:  Varying how long  you squeeze your muscles.  Doing several sets of exercises every day.  Doing exercises for several weeks.  Making Kegel exercises a part of your regular exercise routine. This information is not intended to replace advice given to you by your health care provider. Make sure you discuss any questions you have with your health care provider. Document Released: 07/03/2012 Document Revised: 03/06/2018 Document Reviewed: 03/06/2018 Elsevier Patient Education  Taylor Mccarthy.  Urinary Incontinence  Urinary incontinence refers to a condition in which a person is unable to control where and when to pass urine. A person with this condition will urinate when he or she does not mean to (involuntarily). What are the causes? This condition may be caused by:  Medicines.  Infections.  Constipation.  Overactive bladder muscles.  Weak bladder muscles.  Weak pelvic floor muscles. These muscles provide support for the bladder, intestine, and, in women, the uterus.  Enlarged prostate in men. The prostate is a gland near the bladder. When it gets too big, it can pinch the urethra. With the urethra blocked, the bladder can weaken and lose the ability to empty properly.  Surgery.  Emotional factors, such as anxiety, stress, or post-traumatic stress disorder (PTSD).  Pelvic organ  prolapse. This happens in women when organs shift out of place and into the vagina. This shift can prevent the bladder and urethra from working properly. What increases the risk? The following factors may make you more likely to develop this condition:  Older age.  Obesity and physical inactivity.  Pregnancy and childbirth.  Menopause.  Diseases that affect the nerves or spinal cord (neurological diseases).  Long-term (chronic) coughing. This can increase pressure on the bladder and pelvic floor muscles. What are the signs or symptoms? Symptoms may vary depending on the type of urinary incontinence you have.  They include:  A sudden urge to urinate, but passing urine involuntarily before you can get to a bathroom (urge incontinence).  Suddenly passing urine with any activity that forces urine to pass, such as coughing, laughing, exercise, or sneezing (stress incontinence).  Needing to urinate often, but urinating only a small amount, or constantly dribbling urine (overflow incontinence).  Urinating because you cannot get to the bathroom in time due to a physical disability, such as arthritis or injury, or communication and thinking problems, such as Alzheimer disease (functional incontinence). How is this diagnosed? This condition may be diagnosed based on:  Your medical history.  A physical exam.  Tests, such as: ? Urine tests. ? X-rays of your kidney and bladder. ? Ultrasound. ? CT scan. ? Cystoscopy. In this procedure, a health care provider inserts a tube with a light and camera (cystoscope) through the urethra and into the bladder in order to check for problems. ? Urodynamic testing. These tests assess how well the bladder, urethra, and sphincter can store and release urine. There are different types of urodynamic tests, and they vary depending on what the test is measuring. To help diagnose your condition, your health care provider may recommend that you keep a log of when you urinate and how much you urinate. How is this treated? Treatment for this condition depends on the type of incontinence that you have and its cause. Treatment may include:  Lifestyle changes, such as: ? Quitting smoking. ? Maintaining a healthy weight. ? Staying active. Try to get 150 minutes of moderate-intensity exercise every week. Ask your health care provider which activities are safe for you. ? Eating a healthy diet.  Avoid high-fat foods, like fried foods.  Avoid refined carbohydrates like white bread and white rice.  Limit how much alcohol and caffeine you drink.  Increase your fiber intake.  Foods such as fresh fruits, vegetables, beans, and whole grains are healthy sources of fiber.  Pelvic floor muscle exercises.  Bladder training, such as lengthening the amount of time between bathroom breaks, or using the bathroom at regular intervals.  Using techniques to suppress bladder urges. This can include distraction techniques or controlled breathing exercises.  Medicines to relax the bladder muscles and prevent bladder spasms.  Medicines to help slow or prevent the growth of a man's prostate.  Botox injections. These can help relax the bladder muscles.  Using pulses of electricity to help change bladder reflexes (electrical nerve stimulation).  For women, using a medical device to prevent urine leaks. This is a small, tampon-like, disposable device that is inserted into the urethra.  Injecting collagen or carbon beads (bulking agents) into the urinary sphincter. These can help thicken tissue and close the bladder opening.  Surgery. Follow these instructions at home: Lifestyle  Limit alcohol and caffeine. These can fill your bladder quickly and irritate it.  Keep yourself clean to help prevent odors and skin damage.  Ask your doctor about special skin creams and cleansers that can protect the skin from urine.  Consider wearing pads or adult diapers. Make sure to change them regularly, and always change them right after experiencing incontinence. General instructions  Take over-the-counter and prescription medicines only as told by your health care provider.  Use the bathroom about every 3-4 hours, even if you do not feel the need to urinate. Try to empty your bladder completely every time. After urinating, wait a minute. Then try to urinate again.  Make sure you are in a relaxed position while urinating.  If your incontinence is caused by nerve problems, keep a log of the medicines you take and the times you go to the bathroom.  Keep all follow-up visits as told by your  health care provider. This is important. Contact a health care provider if:  You have pain that gets worse.  Your incontinence gets worse. Get help right away if:  You have a fever or chills.  You are unable to urinate.  You have redness in your groin area or down your legs. Summary  Urinary incontinence refers to a condition in which a person is unable to control where and when to pass urine.  This condition may be caused by medicines, infection, weak bladder muscles, weak pelvic floor muscles, enlargement of the prostate (in men), or surgery.  The following factors increase your risk for developing this condition: older age, obesity, pregnancy and childbirth, menopause, neurological diseases, and chronic coughing.  There are several types of urinary incontinence. They include urge incontinence, stress incontinence, overflow incontinence, and functional incontinence.  This condition is usually treated first with lifestyle and behavioral changes, such as quitting smoking, eating a healthier diet, and doing regular pelvic floor exercises. Other treatment options include medicines, bulking agents, medical devices, electrical nerve stimulation, or surgery. This information is not intended to replace advice given to you by your health care provider. Make sure you discuss any questions you have with your health care provider. Document Released: 08/24/2004 Document Revised: 07/27/2017 Document Reviewed: 10/26/2016 Elsevier Patient Education  2020 [employer].

## 2019-05-07 NOTE — Progress Notes (Signed)
Pre visit review using our clinic review tool, if applicable. No additional management support is needed unless otherwise documented below in the visit note. 

## 2019-05-07 NOTE — Progress Notes (Signed)
Telephone Note  I connected with Taylor Mccarthy   on 05/07/19 at 10:00 AM EDT telephone and verified that I am speaking with the correct person using two identifiers.  Location patient: home Location provider:work or home office Persons participating in the virtual visit: patient, provider  I discussed the limitations of evaluation and management by telemedicine and the availability of in person appointments. The patient expressed understanding and agreed to proceed.   HPI: 1. Hypothyroidism, hashimotos no longer seeing [facility] endocrine on levo 75 mcg qd  Will check labs 2. C/o depression last noted 40 years but pandemic causing loneliness (unable to see family) and depression  zoloft 40 years ago worked  3. C/o chronic dizziness x 3 years no vertigo she does have h/o Afib and cards thinks due to being uncontrolled could cause dizziness. She also has imbalance of gait w/o falls and is careful to walk  4. Urinary incontinence noted nothing tried    ROS: See pertinent positives and negatives per HPI.  Past Medical History:  Diagnosis Date  . Atrial fibrillation, persistent ([place])   . Cancer [facility])    colon cancer 1999 s/p resection   . History of colon polyps   . Insomnia   . Low back pain   . Mitral regurgitation   . Pap smear abnormality of cervix with ASCUS favoring benign   . Sciatica   . Vitamin D deficiency     Past Surgical History:  Procedure Laterality Date  . COLON RESECTION     1999 for cancer   . TONSILLECTOMY    . WISDOM TOOTH EXTRACTION      Family History  Problem Relation Age of Onset  . Heart disease Father   . Hyperlipidemia Father   . 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  Calcium Carb-Cholecalciferol (CALCIUM PLUS VITAMIN D3 PO), Take by mouth., Disp: , Rfl:  .  diazepam (VALIUM) 5 MG tablet,  Take 5 mg by mouth daily as needed for anxiety., Disp: , Rfl:  .  metoprolol succinate (TOPROL-XL) 50 MG 24 hr tablet, Take 1 tablet (50 mg total) by mouth daily., Disp: 30 tablet, Rfl: 1 .  SYNTHROID 75 MCG tablet, Take 1 tablet (75 mcg total) by mouth daily before breakfast., Disp: 90 tablet, Rfl: 3 .  XARELTO 20 MG TABS tablet, Take 1 tablet (20 mg total) by mouth daily with supper., Disp: 180 tablet, Rfl: 2 .  sertraline (ZOLOFT) 25 MG tablet, Take 1 tablet (25 mg total) by mouth daily. In am, Disp: 90 tablet, Rfl: 3  EXAM:  VITALS per patient if applicable:  GENERAL: alert, oriented, appears well and in no acute distress  PSYCH/NEURO: pleasant and cooperative, no obvious depression or anxiety, speech and thought processing grossly intact  ASSESSMENT AND PLAN:  Discussed the following assessment and plan:  Hypothyroidism due to Hashimoto's thyroiditis - Plan: Comprehensive metabolic panel, TSH, T4, free, Thyroid peroxidase antibody will get labs [facility] in [place]   Iron overload - Plan: Iron, TIBC and Ferritin Panel  Depression, unspecified depression type - Plan: sertraline (ZOLOFT) 25 MG tablet PHQ 9 done today after visit  F/u in 4-6 weeks  PHQ 9 score 5 today   Dizziness - Plan: Ambulatory referral to Neurology consider MRI brain h/o Afib  Cards increased toprol to 50 mg qd  Abnormal gait - Plan: Ambulatory referral to Neurology -pt wants appt 08/2019   Urinary incontinence  -disc kegels and myrbetriq let me know in future   HM Flu shot had 04/24/19  pna 23 vaccine had 05/22/17 prevnar 05/09/15 Consider shingrix (declines) and Tdap (mailed Rx in future) Declines MMR check   Pap last 2017 Ob/GYN Dr. Olean Ree [facility] [place], [place] h/o ASCUS pap with h/o HPV + per prior PCP notes  -per notes pap 05/17/16 neg pap +atrophy neg HPV  -HPV + neg pap 09/02/12 -ASCUS pap 02/12/12 +HPV -out of age window pap currently   mammo 11/08/17 [facility]  normal  -ordered GI Breast center  [facility] GI breast center  Coloscopy q3 years last in 2015 or 2016 h/o colon cancer s/p resection 1999 due to f/u per pt consider to f/u with Dr. Jearld Pies in [place]  -pt wants to hold for now 10/7/202 and consider in future establish new GI in future   Former smoker quit in 57s light smoker   Skin-check in 2018/2019 no current issues as of 05/07/19   -we discussed possible serious and likely etiologies, options for evaluation and workup, limitations of telemedicine visit vs in person visit, treatment, treatment risks and precautions. Pt prefers to treat via telemedicine empirically rather then risking or undertaking an in person visit at this moment. Patient agrees to seek prompt in person care if worsening, new symptoms arise, or if is not improving with treatment.   I discussed the assessment and treatment plan with the patient. The patient was provided an opportunity to ask questions and all were answered. The patient agreed with the plan and demonstrated an understanding of the instructions.   The patient was advised to call back or seek an in-person evaluation if the symptoms worsen or if the condition fails to improve as anticipated.  Time spent 25 minutes Delorise Jackson, MD

## 2019-05-22 ENCOUNTER — Ambulatory Visit: Payer: Medicare Other | Admitting: Internal Medicine

## 2019-05-26 ENCOUNTER — Telehealth: Payer: Self-pay | Admitting: *Deleted

## 2019-05-26 NOTE — Telephone Encounter (Signed)
Spoken to patient

## 2019-05-26 NOTE — Telephone Encounter (Signed)
Copied from [place] [phone]. Topic: General - Other >> May 26, 2019 12:09 PM Rainey Pines A wrote: Patient would like a callback from Dr. Dannial Monarch nurse in regards to her having the bloodwork that was ordered done at [facility] in [place] due to her 45 min drive. Please advise

## 2019-06-10 DIAGNOSIS — Z23 Encounter for immunization: Secondary | ICD-10-CM | POA: Diagnosis not present

## 2019-07-08 ENCOUNTER — Ambulatory Visit: Payer: Medicare Other | Admitting: Cardiovascular Disease

## 2019-07-09 ENCOUNTER — Ambulatory Visit: Payer: Medicare Other | Admitting: Neurology

## 2019-07-16 ENCOUNTER — Other Ambulatory Visit: Payer: Self-pay | Admitting: Cardiovascular Disease

## 2019-08-14 ENCOUNTER — Other Ambulatory Visit: Payer: Self-pay | Admitting: Cardiovascular Disease

## 2019-08-14 ENCOUNTER — Other Ambulatory Visit: Payer: Self-pay

## 2019-08-14 MED ORDER — METOPROLOL SUCCINATE ER 50 MG PO TB24: ORAL_TABLET | ORAL | 0 refills | Status: DC

## 2019-08-14 NOTE — Telephone Encounter (Signed)
Medication has been refilled.

## 2019-08-14 NOTE — Telephone Encounter (Signed)
*  STAT* If patient is at the pharmacy, call can be transferred to refill team.   1. Which medications need to be refilled? (please list name of each medication and dose if known)  Metoprolol 50 mg po q d   2. Which pharmacy/location (including street and city if local pharmacy) is medication to be sent to? [place], [place]   3. Do they need a 30 day or 90 day supply? [place]

## 2019-08-14 NOTE — Telephone Encounter (Signed)

## 2019-08-17 NOTE — Progress Notes (Signed)
Virtual Visit via Telephone Note   This visit type was conducted due to national recommendations for restrictions regarding the COVID-19 Pandemic (e.g. social distancing) in an effort to limit this patient's exposure and mitigate transmission in our community.  Due to her co-morbid illnesses, this patient is at least at moderate risk for complications without adequate follow up.  This format is felt to be most appropriate for this patient at this time.  The patient did not have access to video technology/had technical difficulties with video requiring transitioning to audio format only (telephone).  All issues noted in this document were discussed and addressed.  No physical exam could be performed with this format.  Please refer to the patient's chart for her  consent to telehealth for [facility].   Date:  08/19/2019   ID:  Taylor Mccarthy, DOB [birth year], MRN [id number]  Patient Location: Home Provider Location: Office  PCP:  McLean-Scocuzza, Nino Glow, MD  Cardiologist:  Kathlyn Sacramento, MD  Electrophysiologist:  None   Evaluation Performed:  Follow-Up Visit  Chief Complaint:  Follow up  History of Present Illness:    Taylor Mccarthy is a 79 y.o. female with permanent Afib on Xaretlo, mitral regurgitation, frequent PVCs, colon cancer, chronic dizziness, and hypothyroidism who presents for follow up of her Afib.   She was previously followed by Dr. Delman Cheadle. Echo in 06/2017 showed and EF of 55-60%, mild LVH, severe biatrial enlargement, moderate to severe mitral regurgitation, severe tricuspid regurgitation, and PASP 37-50 mmHg. She has reported undergoing DCCV twice in the past, which were not successful leading to rate control strategy. She was last seen in the office in 01/2019 noting dizziness and lightheadedness. She was noted to have frequent PVCs on 12-lead EKG at that visit. Her Toprol was titrated to 50 mg daily. Echo showed an EF of 55-60%, mild LVH, normal RVSF and cavity size, PASP 39.2  mmHg, severe biatrial enlargement, moderate to severe mitral regurgitation, moderate to severe tricuspid regurgitation. Overall, this was a stable study when compared to 2018.   She is seen virtually today and is doing well from a cardiac perspective.  She notes her dizziness has improved since she was last seen.  She is tolerating titrated dose of Toprol as well as Xarelto without issues.  She did have 1 mechanical fall approximately 6 weeks prior in which she was sleepwalking to the restroom and fell sideways over the bathtub.  She did not hit her head.  She did not seek medical attention at that time.  She feels like she has completely healed from this episode.  No symptoms concerning for bleeding.  She does note some peripheral neuropathy which is stable.  No chest pain or dyspnea.  No hematochezia or melena.  She is scheduled to get her initial COVID-19 vaccine later this week.   Labs independently reviewed: 01/2019 - HGB 15.7, PLT 232, BUN 15, SCr 0.64, potassium 4.7 07/2018 - TSH low at 0.411, free T4 elevated at 1.46 05/2018 - TC 198, TG 123, HDL 75, LDL 98, albumin 4.8, AST/ALT normal   The patient does not have symptoms concerning for COVID-19 infection (fever, chills, cough, or new shortness of breath).    Past Medical History:  Diagnosis Date  . Atrial fibrillation, persistent ([place])   . Cancer [facility])    colon cancer 1999 s/p resection   . History of colon polyps   . Insomnia   . Low back pain   . Mitral regurgitation   . Pap smear  abnormality of cervix with ASCUS favoring benign   . Sciatica   . Vitamin D deficiency    Past Surgical History:  Procedure Laterality Date  . COLON RESECTION     1999 for cancer   . TONSILLECTOMY    . WISDOM TOOTH EXTRACTION       Current Meds  Medication Sig  . Calcium Carb-Cholecalciferol (CALCIUM PLUS VITAMIN D3 PO) Take by mouth.  . metoprolol succinate (TOPROL-XL) 50 MG 24 hr tablet TAKE 1 TABLET BY MOUTH ONCE DAILY. NEEDS OFFICE VISIT  FOR FURTHER REFILLS.  Marland Kitchen sertraline (ZOLOFT) 25 MG tablet Take 1 tablet (25 mg total) by mouth daily. In am  . SYNTHROID 75 MCG tablet Take 1 tablet (75 mcg total) by mouth daily before breakfast.  . XARELTO 20 MG TABS tablet Take 1 tablet (20 mg total) by mouth daily with supper.     Allergies:   Morpholine salicylate, Codeine, and Other   Social History   Tobacco Use  . Smoking status: Former Research scientist (life sciences)  . Smokeless tobacco: Never Used  . Tobacco comment: former quit in 30s light smoker per pt   Substance Use Topics  . Alcohol use: Yes  . Drug use: Not Currently     Family Hx: The patient's family history includes Heart disease in her father; Hyperlipidemia in her father; Osteopenia in her sister.  ROS:   Please see the history of present illness.     All other systems reviewed and are negative.   Prior CV studies:   The following studies were reviewed today:  2D Echo 03/2019: 1. The left ventricle has normal systolic function, with an ejection fraction of 55-60%. The cavity size was normal. There is mildly increased left ventricular wall thickness. Left ventricular diastolic Doppler parameters are indeterminate.  2. The right ventricle has normal systolic function. The cavity was normal. There is no increase in right ventricular wall thickness. Right ventricular systolic pressure is mildly elevated with an estimated pressure of 39.2 mmHg.  3. Left atrial size was severely dilated.  4. Right atrial size was severely dilated.  5. Mitral valve regurgitation is moderate to severe  6. Tricuspid valve regurgitation is moderate-severe.  7. Rhythm appears to be atrial fibrillation  8. Details above appear consistent with prior study dated 06/2017  Labs/Other Tests and Data Reviewed:    EKG:  An ECG dated 02/27/2019 was personally reviewed today and demonstrated:  A. fib, 86 bpm, occasional PVCs  Recent Labs: 02/27/2019: BUN 15; Creatinine, Ser 0.64; Hemoglobin 15.7; Platelets 232;  Potassium 4.7; Sodium 142   Recent Lipid Panel Lab Results  Component Value Date/Time   CHOL 198 06/03/2018 10:26 AM   TRIG 123 06/03/2018 10:26 AM   HDL 75 06/03/2018 10:26 AM   LDLCALC 98 06/03/2018 10:26 AM    Wt Readings from Last 3 Encounters:  08/19/19 160 lb (72.6 kg)  05/07/19 155 lb (70.3 kg)  02/27/19 167 lb 4 oz (75.9 kg)     Objective:    Vital Signs:  Ht 5' 6.5" (1.689 m)   Wt 160 lb (72.6 kg)   BMI 25.44 kg/m    VITAL SIGNS:  reviewed  ASSESSMENT & PLAN:    1. Permanent A. Fib: No symptoms concerning for poorly controlled ventricular rates.  Tolerating Toprol-XL 50 mg daily.  She did sustain 1 mechanical fall approximately 6 weeks prior in the setting of a sleepwalking though did not hit her head.  Outside of this, there have been no falls.  No symptoms concerning for bleeding.  She remains on Xarelto given her CHADS2VASc of 3 (age x 2, sex category).  Recent CBC demonstrated stable hemoglobin.  2. Dizziness: Patient indicates this is significantly improved when compared to her last office visit.  I did offer mailing a Zio patch to her to quantify for significant PVC burden.  Given her symptoms have improved, and in the setting of preserved LVSF this is reasonable.  She will contact our office if symptoms return or worsen for outpatient cardiac monitoring.  3. PVCs: Consider outpatient cardiac monitoring if dizziness returns or if frequent PVCs are noted on twelve-lead EKG in follow-up later this year.  Recent echo demonstrated preserved LV systolic function.  Continue Toprol-XL.  4. Mitral and tricuspid regurgitation: Stable, moderate to severe on echo from 03/2019.  Plan for repeat echo in 02/2020 with in person follow-up thereafter.  5. COVID-19 pandemic: She is doing well and remaining at home as much as possible to stay safe.  Social distancing.  Kudos for registering to receive her initial COVID-19 vaccine later this week.  COVID-19 Education: The signs and  symptoms of COVID-19 were discussed with the patient and how to seek care for testing (follow up with PCP or arrange E-visit).  The importance of social distancing was discussed today.  Time:   Today, I have spent 15 minutes with the patient with telehealth technology discussing the above problems.     Medication Adjustments/Labs and Tests Ordered: Current medicines are reviewed at length with the patient today.  Concerns regarding medicines are outlined above.   Tests Ordered: No orders of the defined types were placed in this encounter.   Medication Changes: No orders of the defined types were placed in this encounter.   Follow Up:  In Person in 7 month(s)  Signed, Christell Faith, PA-C  08/19/2019 3:54 PM    [place]

## 2019-08-18 ENCOUNTER — Telehealth: Payer: Medicare Other | Admitting: Family

## 2019-08-19 ENCOUNTER — Other Ambulatory Visit: Payer: Self-pay

## 2019-08-19 ENCOUNTER — Telehealth (INDEPENDENT_AMBULATORY_CARE_PROVIDER_SITE_OTHER): Payer: Medicare Other | Admitting: Physician Assistant

## 2019-08-19 VITALS — Ht 66.5 in | Wt 160.0 lb

## 2019-08-19 DIAGNOSIS — I493 Ventricular premature depolarization: Secondary | ICD-10-CM

## 2019-08-19 DIAGNOSIS — R42 Dizziness and giddiness: Secondary | ICD-10-CM | POA: Diagnosis not present

## 2019-08-19 DIAGNOSIS — I071 Rheumatic tricuspid insufficiency: Secondary | ICD-10-CM

## 2019-08-19 DIAGNOSIS — I4821 Permanent atrial fibrillation: Secondary | ICD-10-CM

## 2019-08-19 DIAGNOSIS — I340 Nonrheumatic mitral (valve) insufficiency: Secondary | ICD-10-CM | POA: Diagnosis not present

## 2019-08-19 NOTE — Patient Instructions (Signed)
Medication Instructions:  Your physician recommends that you continue on your current medications as directed. Please refer to the Current Medication list given to you today.  *If you need a refill on your cardiac medications before your next appointment, please call your pharmacy*  Lab Work: none If you have labs (blood work) drawn today and your tests are completely normal, you will receive your results only by: Marland Kitchen MyChart Message (if you have MyChart) OR . A paper copy in the mail If you have any lab test that is abnormal or we need to change your treatment, we will call you to review the results.  Testing/Procedures:  1- In August 2021 - Your physician has requested that you have an echocardiogram. Echocardiography is a painless test that uses sound waves to create images of your heart. It provides your doctor with information about the size and shape of your heart and how well your heart's chambers and valves are working. This procedure takes approximately one hour. There are no restrictions for this procedure. You may get an IV, if needed, to receive an ultrasound enhancing agent through to better visualize your heart.   Follow-Up: At [facility], you and your health needs are our priority.  As part of our continuing mission to provide you with exceptional heart care, we have created designated Provider Care Teams.  These Care Teams include your primary Cardiologist (physician) and Advanced Practice Providers (APPs -  Physician Assistants and Nurse Practitioners) who all work together to provide you with the care you need, when you need it.  Your next appointment:   In August or September 2021 with Thurmond Butts or Dr Fletcher Anon (make sure echo has been done)  The format for your next appointment:   In Person  Provider:    You may see Kathlyn Sacramento, MD or one of the following Advanced Practice Providers on your designated Care Team:    Christell Faith, [place]

## 2019-08-20 ENCOUNTER — Other Ambulatory Visit: Payer: Medicare Other

## 2019-08-20 ENCOUNTER — Ambulatory Visit: Payer: Medicare Other

## 2019-08-22 DIAGNOSIS — Z23 Encounter for immunization: Secondary | ICD-10-CM | POA: Diagnosis not present

## 2019-09-11 ENCOUNTER — Ambulatory Visit (INDEPENDENT_AMBULATORY_CARE_PROVIDER_SITE_OTHER): Payer: Medicare Other | Admitting: Internal Medicine

## 2019-09-11 ENCOUNTER — Other Ambulatory Visit: Payer: Self-pay

## 2019-09-11 VITALS — Ht 66.5 in | Wt 160.0 lb

## 2019-09-11 DIAGNOSIS — R946 Abnormal results of thyroid function studies: Secondary | ICD-10-CM | POA: Diagnosis not present

## 2019-09-11 DIAGNOSIS — I4819 Other persistent atrial fibrillation: Secondary | ICD-10-CM

## 2019-09-11 DIAGNOSIS — Z13220 Encounter for screening for lipoid disorders: Secondary | ICD-10-CM

## 2019-09-11 DIAGNOSIS — F329 Major depressive disorder, single episode, unspecified: Secondary | ICD-10-CM | POA: Diagnosis not present

## 2019-09-11 DIAGNOSIS — E538 Deficiency of other specified B group vitamins: Secondary | ICD-10-CM

## 2019-09-11 DIAGNOSIS — R7989 Other specified abnormal findings of blood chemistry: Secondary | ICD-10-CM | POA: Diagnosis not present

## 2019-09-11 DIAGNOSIS — D7589 Other specified diseases of blood and blood-forming organs: Secondary | ICD-10-CM | POA: Insufficient documentation

## 2019-09-11 DIAGNOSIS — F32A Depression, unspecified: Secondary | ICD-10-CM

## 2019-09-11 DIAGNOSIS — E039 Hypothyroidism, unspecified: Secondary | ICD-10-CM | POA: Diagnosis not present

## 2019-09-11 DIAGNOSIS — R42 Dizziness and giddiness: Secondary | ICD-10-CM

## 2019-09-11 DIAGNOSIS — D649 Anemia, unspecified: Secondary | ICD-10-CM

## 2019-09-11 MED ORDER — SERTRALINE HCL 50 MG PO TABS: 50.0000 mg | ORAL_TABLET | Freq: Every day | ORAL | 3 refills | Status: DC

## 2019-09-11 NOTE — Patient Instructions (Signed)
Consider brain MRI in the future  Dizziness Dizziness is a common problem. It is a feeling of unsteadiness or light-headedness. You may feel like you are about to faint. Dizziness can lead to injury if you stumble or fall. Anyone can become dizzy, but dizziness is more common in older adults. This condition can be caused by a number of things, including medicines, dehydration, or illness. Follow these instructions at home: Eating and drinking  Drink enough fluid to keep your urine clear or pale yellow. This helps to keep you from becoming dehydrated. Try to drink more clear fluids, such as water.  Do not drink alcohol.  Limit your caffeine intake if told to do so by your health care provider. Check ingredients and nutrition facts to see if a food or beverage contains caffeine.  Limit your salt (sodium) intake if told to do so by your health care provider. Check ingredients and nutrition facts to see if a food or beverage contains sodium. Activity  Avoid making quick movements. ? Rise slowly from chairs and steady yourself until you feel okay. ? In the morning, first sit up on the side of the bed. When you feel okay, stand slowly while you hold onto something until you know that your balance is fine.  If you need to stand in one place for a long time, move your legs often. Tighten and relax the muscles in your legs while you are standing.  Do not drive or use heavy machinery if you feel dizzy.  Avoid bending down if you feel dizzy. Place items in your home so that they are easy for you to reach without leaning over. Lifestyle  Do not use any products that contain nicotine or tobacco, such as cigarettes and e-cigarettes. If you need help quitting, ask your health care provider.  Try to reduce your stress level by using methods such as yoga or meditation. Talk with your health care provider if you need help to manage your stress. General instructions  Watch your dizziness for any  changes.  Take over-the-counter and prescription medicines only as told by your health care provider. Talk with your health care provider if you think that your dizziness is caused by a medicine that you are taking.  Tell a friend or a family member that you are feeling dizzy. If he or she notices any changes in your behavior, have this person call your health care provider.  Keep all follow-up visits as told by your health care provider. This is important. Contact a health care provider if:  Your dizziness does not go away.  Your dizziness or light-headedness gets worse.  You feel nauseous.  You have reduced hearing.  You have new symptoms.  You are unsteady on your feet or you feel like the room is spinning. Get help right away if:  You vomit or have diarrhea and are unable to eat or drink anything.  You have problems talking, walking, swallowing, or using your arms, hands, or legs.  You feel generally weak.  You are not thinking clearly or you have trouble forming sentences. It may take a friend or family member to notice this.  You have chest pain, abdominal pain, shortness of breath, or sweating.  Your vision changes.  You have any bleeding.  You have a severe headache.  You have neck pain or a stiff neck.  You have a fever. These symptoms may represent a serious problem that is an emergency. Do not wait to see if the  symptoms will go away. Get medical help right away. Call your local emergency services (911 in the [place] Do not drive yourself to the hospital. Summary  Dizziness is a feeling of unsteadiness or light-headedness. This condition can be caused by a number of things, including medicines, dehydration, or illness.  Anyone can become dizzy, but dizziness is more common in older adults.  Drink enough fluid to keep your urine clear or pale yellow. Do not drink alcohol.  Avoid making quick movements if you feel dizzy. Monitor your dizziness for any  changes. This information is not intended to replace advice given to you by your health care provider. Make sure you discuss any questions you have with your health care provider. Document Revised: 07/20/2017 Document Reviewed: 08/19/2016 Elsevier Patient Education  2020 [employer].

## 2019-09-11 NOTE — Progress Notes (Signed)
Telephone Note  I connected with Taylor Mccarthy  on 09/11/19 at 10:45 AM EST telephone and verified that I am speaking with the correct person using two identifiers.  Location patient: home Location provider:work or home office Persons participating in the virtual visit: patient, provider  I discussed the limitations of evaluation and management by telemedicine and the availability of in person appointments. The patient expressed understanding and agreed to proceed.   HPI: 1. Chronic dizziness she has seen ENT in the past no help and did not do PT. Dizziness is intermittent and did not see neuro after referred 05/2019 due to weather/transportation. She just tries to move slower when she is dizzy wants to hold on referral for now  2. H/o UTI no current sx's  3. Atrial fib on toprol and xarelto and f/u cards compliant with meds  4. Abnormal thyroid labs will repeat has not had repeat labs or f/u [facility] on synthyroid 75 mcg qd  5. C/o depression due to being social lives alone and more isolated due to staying at home with covid on zoloft 25 mg qd wants to increase dose   ROS: See pertinent positives and negatives per HPI.  Past Medical History:  Diagnosis Date  . Atrial fibrillation, persistent ([place])   . Cancer [facility])    colon cancer 1999 s/p resection   . History of colon polyps   . Insomnia   . Low back pain   . Mitral regurgitation   . Pap smear abnormality of cervix with ASCUS favoring benign   . Sciatica   . Vitamin D deficiency     Past Surgical History:  Procedure Laterality Date  . COLON RESECTION     1999 for cancer   . TONSILLECTOMY    . WISDOM TOOTH EXTRACTION      Family History  Problem Relation Age of Onset  . Heart disease Father   . Hyperlipidemia Father   . 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  Calcium Carb-Cholecalciferol (CALCIUM PLUS VITAMIN D3 PO), Take by mouth., Disp: , Rfl:  .  metoprolol succinate (TOPROL-XL) 50 MG 24 hr tablet, TAKE 1 TABLET BY MOUTH ONCE DAILY. NEEDS OFFICE VISIT FOR FURTHER REFILLS., Disp: 90 tablet, Rfl: 0 .  sertraline (ZOLOFT) 50 MG tablet, Take 1 tablet (50 mg total) by mouth daily. In am, Disp: 90 tablet, Rfl: 3 .  SYNTHROID 75 MCG tablet, Take 1 tablet (75 mcg total) by mouth daily before breakfast., Disp: 90 tablet, Rfl: 3 .  XARELTO 20 MG TABS tablet, Take 1 tablet (20 mg total) by mouth daily with supper., Disp: 180 tablet, Rfl: 2  EXAM:  VITALS per patient if applicable:  GENERAL: alert, oriented, appears well and in no acute distress  PSYCH/NEURO: pleasant and cooperative, no obvious depression or anxiety, speech and thought processing grossly intact  ASSESSMENT AND PLAN:  Discussed the following assessment and plan:  Dizziness, chronic Consider MRI brain in future and reconsider neurology future wants  To hold for now  Persistent atrial fibrillation ([place]) - Plan: Comprehensive metabolic panel Cont meds f/u cards   Hypothyroidism, unspecified type - Plan: TSH, T4, free, T3, free, Thyroid peroxidase antibody abnormal thyroid blood test - Plan: TSH, T4, free, T3, free, Thyroid peroxidase antibody Labs [facility]   Anemia, unspecified type - Plan: Iron,  TIBC and Ferritin Panel, CBC with Differential/Platelet B12 deficiency - Plan: B12 and Folate Panel Folate deficiency - Plan: B12 and Folate Panel  Depression, unspecified depression type - Plan: sertraline (ZOLOFT) 50 MG tablet increased form 25 mg qd   HM Flu shot had 04/24/19  pna 23 vaccine had 05/22/17 and 06/10/19 had itching after vaccine and felt ike had flu prevnar 05/09/15 1/2 covid 19 vaccines had ? moderna 2nd due 09/19/19  Consider shingrix (declines) and Tdap (mailed Rx )  Declines MMR check   Pap last 2017 Ob/GYN Dr. Olean Ree [facility] [place], [place]  1988h/o ASCUS pap with h/o HPV + per prior PCP notes -per notes pap 05/17/16 neg pap +atrophy neg HPV  -HPV + neg pap 09/02/12 -ASCUS pap 02/12/12 +HPV -out of age window pap currently  [facility]  dexaGI breast center 12/04/19   Coloscopyq3 yearslast in 2015 or 2016 h/o colon cancer s/p resection 1999 due to f/u per pt consider to f/u with Dr. Jearld Pies in [place]  -pt wants to hold for now 09/11/19 and consider in future establish new GI in future but as of 09/11/19 will hold   Former smoker quit in 17s light smoker  Skin-check in 2018/2019 no current issues as of 09/11/19   -we discussed possible serious and likely etiologies, options for evaluation and workup, limitations of telemedicine visit vs in person visit, treatment, treatment risks and precautions. Pt prefers to treat via telemedicine empirically rather then risking or undertaking an in person visit at this moment. Patient agrees to seek prompt in person care if worsening, new symptoms arise, or if is not improving with treatment.   I discussed the assessment and treatment plan with the patient. The patient was provided an opportunity to ask questions and all were answered. The patient agreed with the plan and demonstrated an understanding of the instructions.   The patient was advised to call back or seek an in-person evaluation if the symptoms worsen or if the condition fails to improve as anticipated.  Time spent 20 minutes  Delorise Jackson, MD

## 2019-09-17 DIAGNOSIS — Z23 Encounter for immunization: Secondary | ICD-10-CM | POA: Diagnosis not present

## 2019-11-26 ENCOUNTER — Other Ambulatory Visit: Payer: Self-pay

## 2019-11-26 MED ORDER — METOPROLOL SUCCINATE ER 50 MG PO TB24: ORAL_TABLET | ORAL | 0 refills | Status: DC

## 2019-11-26 NOTE — Telephone Encounter (Signed)
*  STAT* If patient is at the pharmacy, call can be transferred to refill team.   1. Which medications need to be refilled? (please list name of each medication and dose if known) Metoprolol  2. Which pharmacy/location (including street and city if local pharmacy) is medication to be sent to? [place]  3. Do they need a 30 day or 90 day supply? [place]

## 2019-12-04 ENCOUNTER — Ambulatory Visit
Admission: RE | Admit: 2019-12-04 | Discharge: 2019-12-04 | Disposition: A | Discharge status: Home / Self Care | Payer: Medicare Other | Source: Ambulatory Visit | Attending: Internal Medicine | Admitting: Internal Medicine

## 2019-12-04 ENCOUNTER — Other Ambulatory Visit: Payer: Self-pay

## 2019-12-04 DIAGNOSIS — Z1231 Encounter for screening mammogram for malignant neoplasm of breast: Secondary | ICD-10-CM

## 2019-12-04 DIAGNOSIS — Z780 Asymptomatic menopausal state: Secondary | ICD-10-CM | POA: Diagnosis not present

## 2019-12-04 DIAGNOSIS — E2839 Other primary ovarian failure: Secondary | ICD-10-CM

## 2019-12-04 DIAGNOSIS — M8589 Other specified disorders of bone density and structure, multiple sites: Secondary | ICD-10-CM | POA: Diagnosis not present

## 2019-12-08 ENCOUNTER — Encounter: Payer: Self-pay | Admitting: Internal Medicine

## 2019-12-11 DIAGNOSIS — H2513 Age-related nuclear cataract, bilateral: Secondary | ICD-10-CM | POA: Diagnosis not present

## 2019-12-11 DIAGNOSIS — H524 Presbyopia: Secondary | ICD-10-CM | POA: Diagnosis not present

## 2020-01-27 ENCOUNTER — Telehealth: Payer: Self-pay | Admitting: Cardiovascular Disease

## 2020-01-27 NOTE — Telephone Encounter (Signed)
   Primary Cardiologist: Kathlyn Sacramento, MD  Chart reviewed as part of pre-operative protocol coverage. Cataract extractions are recognized in guidelines as low risk surgeries that do not typically require specific preoperative testing or holding of blood thinner therapy. Therefore, given past medical history and time since last visit, based on ACC/AHA guidelines, Wateen Varon would be at acceptable risk for the planned procedure without further cardiovascular testing.   I will route this recommendation to the requesting party via Epic fax function and remove from pre-op pool.  Please call with questions.  Charlie Pitter, PA-C 01/27/2020, 4:20 PM

## 2020-01-27 NOTE — Telephone Encounter (Signed)
   [facility] Pre-operative Risk Assessment    HEARTCARE STAFF: - Please ensure there is not already an duplicate clearance open for this procedure. - Under Visit Info/Reason for Call, type in Other and utilize the format Clearance MM/DD/YY or Clearance TBD. Do not use dashes or single digits. - If request is for dental extraction, please clarify the # of teeth to be extracted.  Request for surgical clearance:  1. What type of surgery is being performed? Cataract Extraction with interocular lens implantation of right eye followed by left eye  2. When is this surgery scheduled? OD 02/23/20 and OS 03/15/20  3. What type of clearance is required (medical clearance vs. Pharmacy clearance to hold med vs. Both)? medical  4. Are there any medications that need to be held prior to surgery and how long? States patient does not need to stop any medication   5. Practice name and name of physician performing surgery? [facility], Dr Darleen Crocker, MD  6. What is the office phone number? [phone]   7.   What is the office fax number? [phone]  8.   Anesthesia type (None, local, MAC, general) ? Topical anesthesia w IV medication    Ace Gins 01/27/2020, 3:44 PM  _________________________________________________________________   (provider comments below)

## 2020-02-23 DIAGNOSIS — H2511 Age-related nuclear cataract, right eye: Secondary | ICD-10-CM | POA: Diagnosis not present

## 2020-02-24 DIAGNOSIS — H2512 Age-related nuclear cataract, left eye: Secondary | ICD-10-CM | POA: Diagnosis not present

## 2020-03-15 DIAGNOSIS — H2512 Age-related nuclear cataract, left eye: Secondary | ICD-10-CM | POA: Diagnosis not present

## 2020-03-18 ENCOUNTER — Other Ambulatory Visit: Payer: Self-pay | Admitting: Cardiovascular Disease

## 2020-04-21 ENCOUNTER — Telehealth: Payer: Self-pay

## 2020-04-21 ENCOUNTER — Ambulatory Visit: Payer: Medicare Other

## 2020-04-21 NOTE — Telephone Encounter (Signed)
Failed to reach patient for scheduled awv. No answer. Left message to call the office back during allotted timeframe or reschedule as convenient.

## 2020-04-22 ENCOUNTER — Ambulatory Visit (INDEPENDENT_AMBULATORY_CARE_PROVIDER_SITE_OTHER): Payer: Medicare Other

## 2020-04-22 VITALS — Ht 66.5 in | Wt 160.0 lb

## 2020-04-22 DIAGNOSIS — Z0000 Encounter for general adult medical examination without abnormal findings: Secondary | ICD-10-CM | POA: Diagnosis not present

## 2020-04-22 NOTE — Patient Instructions (Addendum)
Taylor Mccarthy , Thank you for taking time to come for your Medicare Wellness Visit. I appreciate your ongoing commitment to your health goals. Please review the following plan we discussed and let me know if I can assist you in the future.   These are the goals we discussed: Goals      Patient Stated   .  Maintain Healthy Lifestyle (pt-stated)      Stay active Healthy diet       This is a list of the screening recommended for you and due dates:  Health Maintenance  Topic Date Due  . Tetanus Vaccine  09/01/1995  . Flu Shot  Completed  . DEXA scan (bone density measurement)  Completed  . COVID-19 Vaccine  Completed  . Pneumonia vaccines  Completed   Immunizations Immunization History  Administered Date(s) Administered  . DTaP 12/29/1996  . Influenza Inj Mdck Quad Pf 05/17/2016  . Influenza Split 10/30/2010, 05/23/2012  . Influenza, High Dose Seasonal PF 04/26/2018  . Influenza,inj,Quad PF,6+ Mos 05/03/2015  . Influenza,inj,quad, With Preservative 04/26/2018, 04/24/2019  . Pneumococcal Conjugate-13 05/09/2015  . Pneumococcal Polysaccharide-23 05/24/2017, 06/10/2019  . Tdap 08/31/1985  . Tetanus 08/31/1985   Advanced directives: End of life planning; Advance aging; Advanced directives discussed.  Copy of current HCPOA/Living Will requested.    Conditions/risks identified: none new.   Follow up in one year for your annual wellness visit.   Preventive Care 28 Years and Older, Female Preventive care refers to lifestyle choices and visits with your health care provider that can promote health and wellness. What does preventive care include?  A yearly physical exam. This is also called an annual well check.  Dental exams once or twice a year.  Routine eye exams. Ask your health care provider how often you should have your eyes checked.  Personal lifestyle choices, including:  Daily care of your teeth and gums.  Regular physical activity.  Eating a healthy  diet.  Avoiding tobacco and drug use.  Limiting alcohol use.  Practicing safe sex.  Taking low-dose aspirin every day.  Taking vitamin and mineral supplements as recommended by your health care provider. What happens during an annual well check? The services and screenings done by your health care provider during your annual well check will depend on your age, overall health, lifestyle risk factors, and family history of disease. Counseling  Your health care provider may ask you questions about your:  Alcohol use.  Tobacco use.  Drug use.  Emotional well-being.  Home and relationship well-being.  Sexual activity.  Eating habits.  History of falls.  Memory and ability to understand (cognition).  Work and work Statistician.  Reproductive health. Screening  You may have the following tests or measurements:  Height, weight, and BMI.  Blood pressure.  Lipid and cholesterol levels. These may be checked every 5 years, or more frequently if you are over 86 years old.  Skin check.  Lung cancer screening. You may have this screening every year starting at age 25 if you have a 30-pack-year history of smoking and currently smoke or have quit within the past 15 years.  Fecal occult blood test (FOBT) of the stool. You may have this test every year starting at age 29.  Flexible sigmoidoscopy or colonoscopy. You may have a sigmoidoscopy every 5 years or a colonoscopy every 10 years starting at age 73.  Hepatitis C blood test.  Hepatitis B blood test.  Sexually transmitted disease (STD) testing.  Diabetes screening. This is  done by checking your blood sugar (glucose) after you have not eaten for a while (fasting). You may have this done every 1-3 years.  Bone density scan. This is done to screen for osteoporosis. You may have this done starting at age 38.  Mammogram. This may be done every 1-2 years. Talk to your health care provider about how often you should have  regular mammograms. Talk with your health care provider about your test results, treatment options, and if necessary, the need for more tests. Vaccines  Your health care provider may recommend certain vaccines, such as:  Influenza vaccine. This is recommended every year.  Tetanus, diphtheria, and acellular pertussis (Tdap, Td) vaccine. You may need a Td booster every 10 years.  Zoster vaccine. You may need this after age 35.  Pneumococcal 13-valent conjugate (PCV13) vaccine. One dose is recommended after age 25.  Pneumococcal polysaccharide (PPSV23) vaccine. One dose is recommended after age 27. Talk to your health care provider about which screenings and vaccines you need and how often you need them. This information is not intended to replace advice given to you by your health care provider. Make sure you discuss any questions you have with your health care provider. Document Released: 08/13/2015 Document Revised: 04/05/2016 Document Reviewed: 05/18/2015 Elsevier Interactive Patient Education  2017 [employer] Prevention in the Home Falls can cause injuries. They can happen to people of all ages. There are many things you can do to make your home safe and to help prevent falls. What can I do on the outside of my home?  Regularly fix the edges of walkways and driveways and fix any cracks.  Remove anything that might make you trip as you walk through a door, such as a raised step or threshold.  Trim any bushes or trees on the path to your home.  Use bright outdoor lighting.  Clear any walking paths of anything that might make someone trip, such as rocks or tools.  Regularly check to see if handrails are loose or broken. Make sure that both sides of any steps have handrails.  Any raised decks and porches should have guardrails on the edges.  Have any leaves, snow, or ice cleared regularly.  Use sand or salt on walking paths during winter.  Clean up any spills in your  garage right away. This includes oil or grease spills. What can I do in the bathroom?  Use night lights.  Install grab bars by the toilet and in the tub and shower. Do not use towel bars as grab bars.  Use non-skid mats or decals in the tub or shower.  If you need to sit down in the shower, use a plastic, non-slip stool.  Keep the floor dry. Clean up any water that spills on the floor as soon as it happens.  Remove soap buildup in the tub or shower regularly.  Attach bath mats securely with double-sided non-slip rug tape.  Do not have throw rugs and other things on the floor that can make you trip. What can I do in the bedroom?  Use night lights.  Make sure that you have a light by your bed that is easy to reach.  Do not use any sheets or blankets that are too big for your bed. They should not hang down onto the floor.  Have a firm chair that has side arms. You can use this for support while you get dressed.  Do not have throw rugs and other things  on the floor that can make you trip. What can I do in the kitchen?  Clean up any spills right away.  Avoid walking on wet floors.  Keep items that you use a lot in easy-to-reach places.  If you need to reach something above you, use a strong step stool that has a grab bar.  Keep electrical cords out of the way.  Do not use floor polish or wax that makes floors slippery. If you must use wax, use non-skid floor wax.  Do not have throw rugs and other things on the floor that can make you trip. What can I do with my stairs?  Do not leave any items on the stairs.  Make sure that there are handrails on both sides of the stairs and use them. Fix handrails that are broken or loose. Make sure that handrails are as long as the stairways.  Check any carpeting to make sure that it is firmly attached to the stairs. Fix any carpet that is loose or worn.  Avoid having throw rugs at the top or bottom of the stairs. If you do have throw  rugs, attach them to the floor with carpet tape.  Make sure that you have a light switch at the top of the stairs and the bottom of the stairs. If you do not have them, ask someone to add them for you. What else can I do to help prevent falls?  Wear shoes that:  Do not have high heels.  Have rubber bottoms.  Are comfortable and fit you well.  Are closed at the toe. Do not wear sandals.  If you use a stepladder:  Make sure that it is fully opened. Do not climb a closed stepladder.  Make sure that both sides of the stepladder are locked into place.  Ask someone to hold it for you, if possible.  Clearly mark and make sure that you can see:  Any grab bars or handrails.  First and last steps.  Where the edge of each step is.  Use tools that help you move around (mobility aids) if they are needed. These include:  Canes.  Walkers.  Scooters.  Crutches.  Turn on the lights when you go into a dark area. Replace any light bulbs as soon as they burn out.  Set up your furniture so you have a clear path. Avoid moving your furniture around.  If any of your floors are uneven, fix them.  If there are any pets around you, be aware of where they are.  Review your medicines with your doctor. Some medicines can make you feel dizzy. This can increase your chance of falling. Ask your doctor what other things that you can do to help prevent falls. This information is not intended to replace advice given to you by your health care provider. Make sure you discuss any questions you have with your health care provider. Document Released: 05/13/2009 Document Revised: 12/23/2015 Document Reviewed: 08/21/2014 Elsevier Interactive Patient Education  2017 [employer].

## 2020-04-22 NOTE — Progress Notes (Signed)
Subjective:   Taylor Mccarthy is a 79 y.o. female who presents for Medicare Annual (Subsequent) preventive examination.  Review of Systems    No ROS.  Medicare Wellness Virtual Visit.   Cardiac Risk Factors include: advanced age (>72men, >25 women)     Objective:    Today's Vitals   04/22/20 1405  Weight: 160 lb (72.6 kg)  Height: 5' 6.5" (1.689 m)   Body mass index is 25.44 kg/m.  Advanced Directives 04/22/2020 04/21/2019 10/27/2018  Does Patient Have a Medical Advance Directive? Yes Yes No  Type of Paramedic of [place];Living will [place];Living will -  Does patient want to make changes to medical advance directive? - No - Patient declined -  Copy of [place] in Chart? No - copy requested No - copy requested -    Current Medications (verified) Outpatient Encounter Medications as of 04/22/2020  Medication Sig  . Calcium Carbonate (CALCIUM 600 PO) Take 600 mg by mouth in the morning and at bedtime.  . Cholecalciferol (VITAMIN D3) 25 MCG (1000 UT) CAPS Take 1,000 Units by mouth daily.  . metoprolol succinate (TOPROL-XL) 50 MG 24 hr tablet TAKE 1 TABLET BY MOUTH ONCE DAILY. NEEDS OFFICE VISIT FOR FURTHER REFILLS.  Marland Kitchen sertraline (ZOLOFT) 50 MG tablet Take 1 tablet (50 mg total) by mouth daily. In am  . SYNTHROID 75 MCG tablet Take 1 tablet (75 mcg total) by mouth daily before breakfast.  . XARELTO 20 MG TABS tablet Take 1 tablet (20 mg total) by mouth daily with supper.   No facility-administered encounter medications on file as of 04/22/2020.    Allergies (verified) Morpholine salicylate, Codeine, and Other   History: Past Medical History:  Diagnosis Date  . Atrial fibrillation, persistent ([place])   . Cancer [facility])    colon cancer 1999 s/p resection   . History of colon polyps   . Insomnia   . Low back pain   . Mitral regurgitation   . Pap smear abnormality of cervix with ASCUS favoring benign   . Sciatica    . Vitamin D deficiency    Past Surgical History:  Procedure Laterality Date  . COLON RESECTION     1999 for cancer   . TONSILLECTOMY    . WISDOM TOOTH EXTRACTION     Family History  Problem Relation Age of Onset  . Heart disease Father   . Hyperlipidemia Father   . Osteopenia Sister    Social History   Socioeconomic History  . Marital status: Divorced    Spouse name: Not on file  . Number of children: Not on file  . Years of education: Not on file  . Highest education level: Not on file  Occupational History  . Not on file  Tobacco Use  . Smoking status: Former Research scientist (life sciences)  . Smokeless tobacco: Never Used  . Tobacco comment: former quit in 30s light smoker per pt   Vaping Use  . Vaping Use: Never used  Substance and Sexual Activity  . Alcohol use: Yes  . Drug use: Not Currently  . Sexual activity: Not Currently  Other Topics Concern  . Not on file  Social History Narrative   Divorced    College grad    Moved from [place] to [place]   Former Office manager    No guns   Wears seat belts    Social Determinants of Health   Financial Resource Strain: Low Risk   .  Difficulty of Paying Living Expenses: Not hard at all  Food Insecurity: No Food Insecurity  . Worried About Charity fundraiser in the Last Year: Never true  . Ran Out of Food in the Last Year: Never true  Transportation Needs: No Transportation Needs  . Lack of Transportation (Medical): No  . Lack of Transportation (Non-Medical): No  Physical Activity: Unknown  . Days of Exercise per Week: 0 days  . Minutes of Exercise per Session: Not on file  Stress: No Stress Concern Present  . Feeling of Stress : Not at all  Social Connections: Unknown  . Frequency of Communication with Friends and Family: More than three times a week  . Frequency of Social Gatherings with Friends and Family: More than three times a week  . Attends Religious Services: Not on file  . Active Member of  Clubs or Organizations: Not on file  . Attends Archivist Meetings: Not on file  . Marital Status: Not on file    Tobacco Counseling Counseling given: Not Answered Comment: former quit in 65s light smoker per pt    Clinical Intake:  Pre-visit preparation completed: Yes        Diabetes: No  How often do you need to have someone help you when you read instructions, pamphlets, or other written materials from your doctor or pharmacy?: 1 - Never  Interpreter Needed?: No      Activities of Daily Living In your present state of health, do you have any difficulty performing the following activities: 04/22/2020  Hearing? N  Vision? N  Difficulty concentrating or making decisions? N  Walking or climbing stairs? Y  Comment Unsteady gait. Cane in use when ambulating.  Dressing or bathing? N  Doing errands, shopping? N  Preparing Food and eating ? N  Using the Toilet? N  In the past six months, have you accidently leaked urine? Y  Comment Managed with daily pad/brief  Do you have problems with loss of bowel control? N  Managing your Medications? N  Managing your Finances? N  Housekeeping or managing your Housekeeping? N  Some recent data might be hidden    Patient Care Team: McLean-Scocuzza, Nino Glow, MD as PCP - General (Internal Medicine) Wellington Hampshire, MD as PCP - Cardiology (Cardiology)  Indicate any recent Medical Services you may have received from other than [facility] providers in the past year (date may be approximate).     Assessment:   This is a routine wellness examination for Taylor Mccarthy.  I connected with Daje today by telephone and verified that I am speaking with the correct person using two identifiers. Location patient: home Location provider: work Persons participating in the virtual visit: patient, Marine scientist.    I discussed the limitations, risks, security and privacy concerns of performing an evaluation and management service by telephone and the  availability of in person appointments. The patient expressed understanding and verbally consented to this telephonic visit.    Interactive audio and video telecommunications were attempted between this provider and patient, however failed, due to patient having technical difficulties OR patient did not have access to video capability.  We continued and completed visit with audio only.  Some vital signs may be absent or patient reported.   Hearing/Vision screen  Hearing Screening   125Hz  250Hz  500Hz  1000Hz  2000Hz  3000Hz  4000Hz  6000Hz  8000Hz   Right ear:           Left ear:  Comments: Patient is able to hear conversational tones without difficulty.  No issues reported.    Vision Screening Comments: Wears corrective lenses Cataract extraction, bilateral Visual acuity not assessed, virtual visit.  They have seen their ophthalmologist in the last 12 months.    Dietary issues and exercise activities discussed: Current Exercise Habits: The patient does not participate in regular exercise at present  Regular diet Good water intake.   Goals      Patient Stated   .  Maintain Healthy Lifestyle (pt-stated)      Stay active Healthy diet      Depression Screen PHQ 2/9 Scores 04/22/2020 09/11/2019 05/07/2019 04/21/2019 05/28/2018  PHQ - 2 Score 1 5 2  0 0  PHQ- 9 Score - 6 5 - -    Fall Risk Fall Risk  04/22/2020 09/11/2019 05/07/2019 04/21/2019 05/28/2018  Falls in the past year? 0 0 0 0 No  Number falls in past yr: 0 0 - - -  Injury with Fall? - 0 - - -  Risk for fall due to : - History of fall(s) - - -  Follow up Falls evaluation completed - - - -   Handrails in use when climbing stairs? Yes Home free of loose throw rugs in walkways, pet beds, electrical cords, etc? Yes  Adequate lighting in your home to reduce risk of falls? Yes   ASSISTIVE DEVICES UTILIZED TO PREVENT FALLS: Use of a cane, walker or w/c? Yes cane in use.   TIMED UP AND GO: Was the test performed? No .  Virtual visit.  Cognitive Function: Patient is alert and oriented x3.  Denies difficulty focusing, making decisions, memory loss.      6CIT Screen 04/21/2019  What Year? 0 points  What month? 0 points  What time? 0 points  Count back from 20 0 points  Months in reverse 0 points  Repeat phrase 0 points  Total Score 0    Immunizations Immunization History  Administered Date(s) Administered  . DTaP 12/29/1996  . Influenza Inj Mdck Quad Pf 05/17/2016  . Influenza Split 10/30/2010, 05/23/2012  . Influenza, High Dose Seasonal PF 04/26/2018  . Influenza,inj,Quad PF,6+ Mos 05/03/2015  . Influenza,inj,quad, With Preservative 04/26/2018, 04/24/2019  . Influenza-Unspecified 03/29/2020  . PFIZER SARS-COV-2 Vaccination 08/29/2019, 09/19/2019  . Pneumococcal Conjugate-13 05/09/2015  . Pneumococcal Polysaccharide-23 05/24/2017, 06/10/2019  . Tdap 08/31/1985  . Tetanus 08/31/1985    Health Maintenance Health Maintenance  Topic Date Due  . Samul Dada  09/01/1995  . INFLUENZA VACCINE  Completed  . DEXA SCAN  Completed  . COVID-19 Vaccine  Completed  . PNA vac Low Risk Adult  Completed    Dental Screening: Recommended annual dental exams for proper oral hygiene.  Community Resource Referral / Chronic Care Management: CRR required this visit?  No   CCM required this visit?  No      Plan:   Keep all routine appointments as scheduled.   Patient plans to complete labs with [facility] in [place] within the month.   I have personally reviewed and noted the following in the patient's chart:   . Medical and social history . Use of alcohol, tobacco or illicit drugs  . Current medications and supplements . Functional ability and status . Nutritional status . Physical activity . Advanced directives . List of other physicians- appointment with Cardiology 05/18/20.  Marland Kitchen Hospitalizations, surgeries, and ER visits in previous 12 months . Vitals . Screenings to include cognitive,  depression, and falls . Referrals and  appointments  In addition, I have reviewed and discussed with patient certain preventive protocols, quality metrics, and best practice recommendations. A written personalized care plan for preventive services as well as general preventive health recommendations were provided to patient via mychart.     Varney Biles, LPN   [id number]

## 2020-05-03 ENCOUNTER — Telehealth: Payer: Self-pay

## 2020-05-03 ENCOUNTER — Other Ambulatory Visit: Payer: Self-pay | Admitting: Internal Medicine

## 2020-05-03 DIAGNOSIS — E039 Hypothyroidism, unspecified: Secondary | ICD-10-CM

## 2020-05-03 MED ORDER — SYNTHROID 75 MCG PO TABS: 75.0000 ug | ORAL_TABLET | Freq: Every day | ORAL | 0 refills | Status: DC

## 2020-05-03 MED ORDER — SYNTHROID 75 MCG PO TABS: 75.0000 ug | ORAL_TABLET | Freq: Every day | ORAL | 3 refills | Status: DC

## 2020-05-03 NOTE — Telephone Encounter (Signed)
Pt needs SYNTHROID 75 MCG tablet sent to [facility] in [place]. Pt is out of medication-Please send for 90 days

## 2020-05-18 ENCOUNTER — Other Ambulatory Visit: Payer: Self-pay

## 2020-05-18 ENCOUNTER — Ambulatory Visit (INDEPENDENT_AMBULATORY_CARE_PROVIDER_SITE_OTHER): Payer: Medicare Other | Admitting: Cardiovascular Disease

## 2020-05-18 ENCOUNTER — Encounter: Payer: Self-pay | Admitting: Cardiovascular Disease

## 2020-05-18 ENCOUNTER — Ambulatory Visit (INDEPENDENT_AMBULATORY_CARE_PROVIDER_SITE_OTHER): Payer: Medicare Other

## 2020-05-18 VITALS — BP 130/90 | HR 82 | Ht 66.0 in | Wt 165.2 lb

## 2020-05-18 DIAGNOSIS — I4821 Permanent atrial fibrillation: Secondary | ICD-10-CM | POA: Diagnosis not present

## 2020-05-18 DIAGNOSIS — I493 Ventricular premature depolarization: Secondary | ICD-10-CM

## 2020-05-18 DIAGNOSIS — I340 Nonrheumatic mitral (valve) insufficiency: Secondary | ICD-10-CM

## 2020-05-18 LAB — ECHOCARDIOGRAM COMPLETE
AR max vel: 1.89 cm2
AV Area VTI: 1.62 cm2
AV Area mean vel: 1.71 cm2
AV Mean grad: 2 mmHg
AV Peak grad: 4.2 mmHg
Ao pk vel: 1.02 m/s
Calc EF: 46.3 %
S' Lateral: 2.8 cm
Single Plane A2C EF: 46.5 %
Single Plane A4C EF: 45.6 %

## 2020-05-18 NOTE — Progress Notes (Signed)
Cardiology Office Note   Date:  05/18/2020   ID:  Miguel Medal, DOB [birth year], MRN [id number]  PCP:  McLean-Scocuzza, Nino Glow, MD  Cardiologist:   Kathlyn Sacramento, MD   Chief Complaint  Patient presents with  . OTHER    9 month f/u echo c/o discoloration feet and lower legs. Meds reviewed verbally with pt.      History of Present Illness: Taylor Mccarthy is a 79 y.o. female who is here today for follow-up visit regarding permanent atrial fibrillation and moderate mitral and tricuspid regurgitation.    She has known history of hypothyroidism and permanent atrial fibrillation on long-term anticoagulation with Xarelto.  She has been followed in the past by Dr. Wallace Cullens.  She had echocardiogram done in December 2018 which with an EF of 55 to 60%, mild LVH, severely dilated left atrium, severely dilated right atrium, moderate to severe mitral regurgitation and severe tricuspid regurgitation.  Estimated pulmonary artery pressure was between 37 to 50 mmHg. She has also history of colon cancer.  Atrial fibrillation is being treated with rate control anticoagulation.  She has been doing reasonably well with no recent chest pain, worsening dyspnea or palpitations.  No side effects with anticoagulation.  She had an echocardiogram done today which was personally reviewed by me and showed normal LV systolic function, severe biatrial enlargement and stable moderate mitral and tricuspid regurgitation with only mild pulmonary hypertension. She reports dark discoloration of both feet but has no claudication.  Past Medical History:  Diagnosis Date  . Atrial fibrillation, persistent ([place])   . Cancer [facility])    colon cancer 1999 s/p resection   . History of colon polyps   . Insomnia   . Low back pain   . Mitral regurgitation   . Pap smear abnormality of cervix with ASCUS favoring benign   . Sciatica   . Vitamin D deficiency     Past Surgical History:  Procedure Laterality Date  . COLON  RESECTION     1999 for cancer   . TONSILLECTOMY    . WISDOM TOOTH EXTRACTION       Current Outpatient Medications  Medication Sig Dispense Refill  . Calcium Carbonate (CALCIUM 600 PO) Take 600 mg by mouth in the morning and at bedtime.    . Cholecalciferol (VITAMIN D3) 25 MCG (1000 UT) CAPS Take 1,000 Units by mouth daily.    . metoprolol succinate (TOPROL-XL) 50 MG 24 hr tablet TAKE 1 TABLET BY MOUTH ONCE DAILY. NEEDS OFFICE VISIT FOR FURTHER REFILLS. 90 tablet 0  . sertraline (ZOLOFT) 50 MG tablet Take 1 tablet (50 mg total) by mouth daily. In am 90 tablet 3  . SYNTHROID 75 MCG tablet Take 1 tablet (75 mcg total) by mouth daily before breakfast. See refills no further refills needs appt in office as not seen in person> 1 year 90 tablet 0  . XARELTO 20 MG TABS tablet Take 1 tablet (20 mg total) by mouth daily with supper. 180 tablet 2   No current facility-administered medications for this visit.    Allergies:   Morpholine salicylate, Codeine, and Other    Social History:  The patient  reports that she has quit smoking. She has never used smokeless tobacco. She reports current alcohol use. She reports previous drug use.   Family History:  The patient's family history includes Heart disease in her father; Hyperlipidemia in her father; Osteopenia in her sister.    ROS:  Please see the history of  present illness.   Otherwise, review of systems are positive for none.   All other systems are reviewed and negative.    PHYSICAL EXAM: VS:  BP 130/90 (BP Location: Left Arm, Patient Position: Sitting, Cuff Size: Normal)   Pulse 82   Ht 5\' 6"  (1.676 m)   Wt 165 lb 4 oz (75 kg)   SpO2 97%   BMI 26.67 kg/m  , BMI Body mass index is 26.67 kg/m. GEN: Well nourished, well developed, in no acute distress  HEENT: normal  Neck: no JVD, carotid bruits, or masses Cardiac: Irregularly irregular; no murmurs, rubs, or gallops,no edema  Respiratory:  clear to auscultation bilaterally, normal work  of breathing GI: soft, nontender, nondistended, + BS MS: no deformity or atrophy  Skin: warm and dry, no rash Neuro:  Strength and sensation are intact Psych: euthymic mood, full affect Some cyanosis in both feet but posterior tibial pulses palpable bilaterally.  EKG:  EKG is ordered today. The ekg ordered today demonstrates atrial fibrillation with ventricular rate of 82 bpm.   Recent Labs: No results found for requested labs within last 8760 hours.    Lipid Panel    Component Value Date/Time   CHOL 198 06/03/2018 1026   TRIG 123 06/03/2018 1026   HDL 75 06/03/2018 1026   LDLCALC 98 06/03/2018 1026      Wt Readings from Last 3 Encounters:  05/18/20 165 lb 4 oz (75 kg)  04/22/20 160 lb (72.6 kg)  09/11/19 160 lb (72.6 kg)       PAD Screen 02/27/2019  Previous PAD dx? No  Previous surgical procedure? No  Pain with walking? No  Feet/toe relief with dangling? No  Painful, non-healing ulcers? No  Extremities discolored? No      ASSESSMENT AND PLAN:  1.  Permanent atrial fibrillation: Ventricular rate is more controlled since increasing the dose of Toprol to 50 mg once daily.  She is tolerating anticoagulation.   2.  Mitral regurgitation and tricuspid regurgitation: Repeat echocardiogram today showed stable moderate mitral and tricuspid regurgitation.  3.  Chronic venous insufficiency: Bluish discoloration of both feet is likely due to this.  She has no claudication and she has palpable distal pulses.  I asked her to start wearing knee-high support stockings low pressure during the day.    Disposition:   FU with me in 6 months  Signed,  Kathlyn Sacramento, MD  05/18/2020 3:15 PM    [place]

## 2020-05-18 NOTE — Patient Instructions (Signed)
Medication Instructions:  Your physician recommends that you continue on your current medications as directed. Please refer to the Current Medication list given to you today.  *If you need a refill on your cardiac medications before your next appointment, please call your pharmacy*  Follow-Up: At [facility], you and your health needs are our priority.  As part of our continuing mission to provide you with exceptional heart care, we have created designated Provider Care Teams.  These Care Teams include your primary Cardiologist (physician) and Advanced Practice Providers (APPs -  Physician Assistants and Nurse Practitioners) who all work together to provide you with the care you need, when you need it.  We recommend signing up for the patient portal called "MyChart".  Sign up information is provided on this After Visit Summary.  MyChart is used to connect with patients for Virtual Visits (Telemedicine).  Patients are able to view lab/test results, encounter notes, upcoming appointments, etc.  Non-urgent messages can be sent to your provider as well.   To learn more about what you can do with MyChart, go to [email or web address].    Your next appointment:   6 month(s)  The format for your next appointment:   In Person  Provider:   You may see Kathlyn Sacramento, MD or one of the following Advanced Practice Providers on your designated Care Team:    Murray Hodgkins, NP  Christell Faith, PA-C  Marrianne Mood, PA-C  Cadence Playas, [place]

## 2020-06-05 ENCOUNTER — Other Ambulatory Visit: Payer: Self-pay | Admitting: Cardiovascular Disease

## 2020-06-07 NOTE — Telephone Encounter (Signed)
Refill request

## 2020-06-07 NOTE — Telephone Encounter (Signed)
Pt's age 79, wt 3 kg, last ov w/ MA 05/18/20, CrCl 81.62 - based off of SCr from 2020, pt has orders for labs, but has not had them drawn yet. Will send in 30 day supply w/ instructions to have labs drawn prior to further refills.

## 2020-06-17 DIAGNOSIS — Z23 Encounter for immunization: Secondary | ICD-10-CM | POA: Diagnosis not present

## 2020-07-12 ENCOUNTER — Other Ambulatory Visit: Payer: Self-pay | Admitting: Physician Assistant

## 2020-07-16 ENCOUNTER — Other Ambulatory Visit: Payer: Self-pay | Admitting: Cardiovascular Disease

## 2020-07-16 DIAGNOSIS — I340 Nonrheumatic mitral (valve) insufficiency: Secondary | ICD-10-CM

## 2020-07-16 DIAGNOSIS — I4821 Permanent atrial fibrillation: Secondary | ICD-10-CM

## 2020-07-16 NOTE — Telephone Encounter (Signed)
Please review for refill. Thanks!  

## 2020-07-16 NOTE — Telephone Encounter (Signed)
Pt last saw Dr Fletcher Anon 05/18/20, last labs 02/27/19 Creat 0.64, pt is overdue for labwork. Age 79, weight 75kg, CrCl 81.62, Xarelto refilled on 06/05/20 x 30 days with instructions to pt to have pending labs ordered by PCP in February drawn per Mandi's note 06/07/20. Labwork still overdue. Needs CBC and BMP drawn to evaluate Xarelto dosing prior to refill. Attempted to contact pt to notify and have her call [place] office to schedule.  LMOM TCB to schedule labs or notify if she is going to PCP to have pending labwork drawn.

## 2020-07-16 NOTE — Telephone Encounter (Signed)
Lab work has been placed for CBC and BMET. I am doing well. I hope you are!!

## 2020-07-19 ENCOUNTER — Encounter: Payer: Self-pay | Admitting: Cardiovascular Disease

## 2020-07-19 DIAGNOSIS — I340 Nonrheumatic mitral (valve) insufficiency: Secondary | ICD-10-CM | POA: Diagnosis not present

## 2020-07-19 NOTE — Telephone Encounter (Signed)
Patient calling again to check on status  [facility], please advise or call if able to let her know orders are available  Patient is also needing a small portion of Xarelto medication sent to [facility] in [place] while she waits on actual refill  Patient only has 2 pills left

## 2020-07-19 NOTE — Telephone Encounter (Signed)
Please advise for refill.  ? ?Thanks! ?

## 2020-07-19 NOTE — Telephone Encounter (Signed)
Patient is on the way to [facility] in [place], [place] to get labwork done. Patient states the orders that were previously sent are now considered old and needs new lab reqs sent to [facility] on [address] Fax number is [phone]

## 2020-08-05 ENCOUNTER — Other Ambulatory Visit: Payer: Self-pay | Admitting: *Deleted

## 2020-08-05 MED ORDER — RIVAROXABAN 20 MG PO TABS: ORAL_TABLET | ORAL | 1 refills | Status: DC

## 2020-08-05 NOTE — Telephone Encounter (Signed)
Pt last saw Dr Kirke Corin 05/18/20, last labs 07/19/20 Creat 0.61, age 80, weight 75kg, CrCl 85.64, based on CrCl pt is on appropriate dosage of Xarelto 20mg  QD.  Will refill rx

## 2020-08-06 ENCOUNTER — Other Ambulatory Visit: Payer: Self-pay | Admitting: *Deleted

## 2020-08-06 DIAGNOSIS — F32A Depression, unspecified: Secondary | ICD-10-CM

## 2020-08-06 MED ORDER — SERTRALINE HCL 50 MG PO TABS: 50.0000 mg | ORAL_TABLET | Freq: Every day | ORAL | 0 refills | Status: DC

## 2020-08-07 ENCOUNTER — Other Ambulatory Visit (HOSPITAL_COMMUNITY): Payer: Self-pay | Admitting: *Deleted

## 2020-08-07 DIAGNOSIS — E039 Hypothyroidism, unspecified: Secondary | ICD-10-CM

## 2020-08-07 NOTE — Telephone Encounter (Signed)
Okay to refill?  Pt overdue to recheck TSH

## 2020-08-09 ENCOUNTER — Encounter: Payer: Self-pay | Admitting: Internal Medicine

## 2020-08-09 MED ORDER — SYNTHROID 75 MCG PO TABS: 75.0000 ug | ORAL_TABLET | Freq: Every day | ORAL | 0 refills | Status: DC

## 2020-08-12 ENCOUNTER — Telehealth: Payer: Self-pay

## 2020-08-12 NOTE — Telephone Encounter (Signed)
Needs an appt asap Can take 1/2 pill x 1 week then every other day x 1 week then stop  Again not seen in > 1 year and this is why cant otherwise advise unless has appt and needs in person  No further refills

## 2020-08-12 NOTE — Telephone Encounter (Signed)
Pt would like to taper off of sertraline (ZOLOFT) 50 MG tablet and would like to be told how to do this. She states that she read the side effects and does not want to take it anymore. She states that the pandemic is here to stay and she is going to just have to get used to it. Please advise

## 2020-08-12 NOTE — Telephone Encounter (Signed)
Please advise 

## 2020-08-13 NOTE — Telephone Encounter (Signed)
Patient informed and verbalized understanding.  Scheduled for 3/2 at 11:00 for physical and fasting labs

## 2020-09-29 ENCOUNTER — Ambulatory Visit (INDEPENDENT_AMBULATORY_CARE_PROVIDER_SITE_OTHER): Payer: Medicare HMO

## 2020-09-29 ENCOUNTER — Ambulatory Visit (INDEPENDENT_AMBULATORY_CARE_PROVIDER_SITE_OTHER): Payer: Medicare HMO | Admitting: Internal Medicine

## 2020-09-29 ENCOUNTER — Encounter: Payer: Self-pay | Admitting: Internal Medicine

## 2020-09-29 ENCOUNTER — Other Ambulatory Visit: Payer: Self-pay

## 2020-09-29 ENCOUNTER — Telehealth: Payer: Self-pay | Admitting: Internal Medicine

## 2020-09-29 VITALS — BP 116/78 | HR 100 | Temp 98.3°F | Ht 64.57 in | Wt 167.2 lb

## 2020-09-29 DIAGNOSIS — G8929 Other chronic pain: Secondary | ICD-10-CM | POA: Diagnosis not present

## 2020-09-29 DIAGNOSIS — M19012 Primary osteoarthritis, left shoulder: Secondary | ICD-10-CM | POA: Diagnosis not present

## 2020-09-29 DIAGNOSIS — M542 Cervicalgia: Secondary | ICD-10-CM | POA: Diagnosis not present

## 2020-09-29 DIAGNOSIS — M25512 Pain in left shoulder: Secondary | ICD-10-CM

## 2020-09-29 DIAGNOSIS — I739 Peripheral vascular disease, unspecified: Secondary | ICD-10-CM | POA: Diagnosis not present

## 2020-09-29 DIAGNOSIS — Z0000 Encounter for general adult medical examination without abnormal findings: Secondary | ICD-10-CM | POA: Diagnosis not present

## 2020-09-29 DIAGNOSIS — M25562 Pain in left knee: Secondary | ICD-10-CM

## 2020-09-29 DIAGNOSIS — E039 Hypothyroidism, unspecified: Secondary | ICD-10-CM

## 2020-09-29 DIAGNOSIS — I872 Venous insufficiency (chronic) (peripheral): Secondary | ICD-10-CM | POA: Diagnosis not present

## 2020-09-29 DIAGNOSIS — F32A Depression, unspecified: Secondary | ICD-10-CM

## 2020-09-29 DIAGNOSIS — R7989 Other specified abnormal findings of blood chemistry: Secondary | ICD-10-CM

## 2020-09-29 DIAGNOSIS — Z1231 Encounter for screening mammogram for malignant neoplasm of breast: Secondary | ICD-10-CM

## 2020-09-29 DIAGNOSIS — Z1283 Encounter for screening for malignant neoplasm of skin: Secondary | ICD-10-CM | POA: Diagnosis not present

## 2020-09-29 DIAGNOSIS — Z1211 Encounter for screening for malignant neoplasm of colon: Secondary | ICD-10-CM

## 2020-09-29 DIAGNOSIS — R269 Unspecified abnormalities of gait and mobility: Secondary | ICD-10-CM

## 2020-09-29 MED ORDER — SYNTHROID 75 MCG PO TABS: 75.0000 ug | ORAL_TABLET | Freq: Every day | ORAL | 3 refills | Status: DC

## 2020-09-29 MED ORDER — SERTRALINE HCL 50 MG PO TABS: 50.0000 mg | ORAL_TABLET | Freq: Every day | ORAL | 3 refills | Status: DC

## 2020-09-29 NOTE — Progress Notes (Signed)
Chief Complaint  Patient presents with   Follow-up   Knee Pain    Left knee 8/10 pain    Annual with complaints  1. C/o left knee pain with falls x 2 and left shoulder (worse since moderna shot 05/2020 left arm)>right shoulder pain 8/10 and neck pain  Left shoulder and neck Xray today  She also c/o dropping items more frequently though no numbness/tingling in upper arms and reduced ROM left shoulder and left knee will give out She tried advil as tylenol did not help with sx's pain 8/10 in areas mentioned  2. Red/purple skin changes to lower legs will refer vascular 3. Skin changes left arm refer derm 4. H/o colon cancer declines GI colonoscopy for now  5. Stress with 1/2 daughters and not speaking with her GAD 7 score 3 and PHQ 9 score 6 on zoloft 50 mg qd   Review of Systems  Constitutional: Negative for weight loss.  HENT: Negative for hearing loss.   Eyes: Negative for blurred vision.  Respiratory: Negative for shortness of breath.   Cardiovascular: Negative for chest pain.  Gastrointestinal: Negative for abdominal pain.  Musculoskeletal: Positive for falls, joint pain and neck pain.  Skin: Positive for rash.  Neurological: Negative for headaches.  Psychiatric/Behavioral: Negative for depression.       Family stressors with daughter   Past Medical History:  Diagnosis Date   Atrial fibrillation, persistent ([place])    Cancer ([place])    colon cancer 1999 s/p resection    History of colon polyps    Insomnia    Low back pain    Mitral regurgitation    Pap smear abnormality of cervix with ASCUS favoring benign    Sciatica    Vitamin D deficiency    Past Surgical History:  Procedure Laterality Date   COLON RESECTION     1999 for cancer    TONSILLECTOMY     WISDOM TOOTH EXTRACTION     Family History  Problem Relation Age of Onset   Heart disease Father    Hyperlipidemia Father    Osteopenia Sister    Social History   Socioeconomic History   Marital  status: Divorced    Spouse name: Not on file   Number of children: Not on file   Years of education: Not on file   Highest education level: Not on file  Occupational History   Not on file  Tobacco Use   Smoking status: Former Smoker   Smokeless tobacco: Never Used   Tobacco comment: former quit in 76s light smoker per pt   Vaping Use   Vaping Use: Never used  Substance and Sexual Activity   Alcohol use: Yes   Drug use: Not Currently   Sexual activity: Not Currently  Other Topics Concern   Not on file  Social History Narrative   Divorced    College grad    [place] from [place] to [place]    Former Office manager    No guns   Wears seat belts    Social Determinants of Radio broadcast assistant Strain: Low Risk    Difficulty of Paying Living Expenses: Not hard at all  Food Insecurity: No Food Insecurity   Worried About Charity fundraiser in the Last Year: Never true   Arboriculturist in the Last Year: Never true  Transportation Needs: No Transportation Needs   Lack of Transportation (Medical): No   Lack of Transportation (Non-Medical): No  Physical  Activity: Unknown   Days of Exercise per Week: 0 days   Minutes of Exercise per Session: Not on file  Stress: No Stress Concern Present   Feeling of Stress : Not at all  Social Connections: Unknown   Frequency of Communication with Friends and Family: More than three times a week   Frequency of Social Gatherings with Friends and Family: More than three times a week   Attends Religious Services: Not on Electrical engineer or Organizations: Not on file   Attends Archivist Meetings: Not on file   Marital Status: Not on file  Intimate Partner Violence: Not At Risk   Fear of Current or Ex-Partner: No   Emotionally Abused: No   Physically Abused: No   Sexually Abused: No   Current Meds  Medication Sig   Calcium Carbonate (CALCIUM 600 PO) Take 600  mg by mouth in the morning and at bedtime.   metoprolol succinate (TOPROL-XL) 50 MG 24 hr tablet TAKE 1 TABLET BY MOUTH ONCE DAILY. NEEDS OFFICE VISIT FOR FURTHER REFILLS.   Multiple Vitamin (MULTIVITAMIN) tablet Take 1 tablet by mouth daily.   rivaroxaban (XARELTO) 20 MG TABS tablet TAKE (1) TABLET BY MOUTH DAILY WITH SUPPER.   sertraline (ZOLOFT) 50 MG tablet Take 1 tablet (50 mg total) by mouth daily. In am   SYNTHROID 75 MCG tablet Take 1 tablet (75 mcg total) by mouth daily before breakfast. See refills no further refills needs appt in office as not seen in person> 1 year as of 08/09/20 again no further refills beyond this until appt call office for in person appt asap   Allergies  Allergen Reactions   Morpholine Salicylate    Codeine     Nausea     Other     Tree pollen     No results found for this or any previous visit (from the past 2160 hour(s)). Objective  Body mass index is 28.2 kg/m. Wt Readings from Last 3 Encounters:  09/29/20 167 lb 3.2 oz (75.8 kg)  05/18/20 165 lb 4 oz (75 kg)  04/22/20 160 lb (72.6 kg)   Temp Readings from Last 3 Encounters:  09/29/20 98.3 F (36.8 C) (Oral)  02/27/19 (!) 97.4 F (36.3 C)  10/27/18 98.5 F (36.9 C) (Oral)   BP Readings from Last 3 Encounters:  09/29/20 116/78  05/18/20 130/90  02/27/19 (!) 136/94   Pulse Readings from Last 3 Encounters:  09/29/20 100  05/18/20 82  02/27/19 86    Physical Exam Vitals and nursing note reviewed.  Constitutional:      Appearance: Normal appearance. She is well-developed and well-groomed.  HENT:     Head: Normocephalic and atraumatic.  Eyes:     Conjunctiva/sclera: Conjunctivae normal.     Pupils: Pupils are equal, round, and reactive to light.  Cardiovascular:     Rate and Rhythm: Normal rate and regular rhythm.     Heart sounds: Normal heart sounds. No murmur heard.     Comments: No Afib noted today Pulmonary:     Effort: Pulmonary effort is normal.     Breath  sounds: Normal breath sounds.  Abdominal:     Tenderness: There is no abdominal tenderness.  Musculoskeletal:     Left shoulder: Tenderness present. Decreased range of motion.  Skin:    General: Skin is warm and dry.  Neurological:     General: No focal deficit present.     Mental Status: She is alert  and oriented to person, place, and time. Mental status is at baseline.     Comments: Walking with cane  Psychiatric:        Attention and Perception: Attention and perception normal.        Mood and Affect: Mood and affect normal.        Speech: Speech normal.        Behavior: Behavior normal. Behavior is cooperative.        Thought Content: Thought content normal.        Cognition and Memory: Cognition and memory normal.        Judgment: Judgment normal.     Assessment  Plan  Annual physical exam Flu shot had utd pna 23 vaccine had 05/22/17 and 06/10/19 had itching after vaccine and felt ike had flu prevnar 05/09/15 moderna 3/3 Consider shingrix(declines)and Tdap (mailed Rx)  Declines MMR check   Pap last 2017 Ob/GYN Dr. Olean Ree [facility] [place], LMP 1988h/o ASCUS pap with h/o HPV + per prior PCP notes -per notes pap 05/17/16 neg pap +atrophy neg HPV  -HPV + neg pap 09/02/12 -ASCUS pap 02/12/12 +HPV -out of age window pap currently  [facility]19 negative  dexaGI breast center 12/04/19 ordered 11/2020   Coloscopyq3 yearslast in 2015 or 2016 h/o colon cancer s/p resection 1999 due to f/u per pt consider to f/u with Dr. Jearld Pies in [place] -pt wants to hold for now 09/29/20 and consider in future establish new GI in futureleb  Former smoker quit in 59s light smoker  Skin-check in 2018/2019 referred today [place] derm  Chronic left shoulder pain - Plan: DG Shoulder Left, Ambulatory referral to Orthopedic Surgery Cervicalgia - Plan: DG Cervical Spine Complete, Ambulatory referral to  Orthopedic Surgery Acute pain of left knee - Plan: Ambulatory referral to Orthopedic Surgery -she is also dropping items and needs EMG/NCS  Consider MRI brain in future and reconsider neurology future wants  To hold for now  PVD (peripheral vascular disease) ([place]) - Plan: Ambulatory referral to Vascular Surgery Venous stasis dermatitis of both lower extremities - Plan: Ambulatory referral to Vascular Surgery rec ABI/arterial doppler to w/u   Hypothyroidism, unspecified type On synthyroid 75 mcg qd  Request labs [facility] do not think shes had them [facility] [place]   Abnormal gait Offered rollator and declined   Depression, unspecified depression type  GAD 7 3 and PHQ 9 score 6 today  On zoloft 50 mg qd   Persistent atrial fibrillation ([place]) - Plan: Comprehensive metabolic panel Cont meds f/u cards Dr. Fletcher Anon       Provider: Dr. Olivia Mackie McLean-Scocuzza-Internal Medicine

## 2020-09-29 NOTE — Patient Instructions (Addendum)
Consider colonoscopy  [facility] [place]   [facility]  [place]  [phone]   [facility] dermatology  [place]  [phone]  [facility]  [place]  [place] [phone]   Voltaren gel 4x per day and Tylenol  Nerve conduction study   Knee Exercises Ask your health care provider which exercises are safe for you. Do exercises exactly as told by your health care provider and adjust them as directed. It is normal to feel mild stretching, pulling, tightness, or discomfort as you do these exercises. Stop right away if you feel sudden pain or your pain gets worse. Do not begin these exercises until told by your health care provider. Stretching and range-of-motion exercises These exercises warm up your muscles and joints and improve the movement and flexibility of your knee. These exercises also help to relieve pain and swelling. Knee extension, prone 1. Lie on your abdomen (prone position) on a bed. 2. Place your left / right knee just beyond the edge of the surface so your knee is not on the bed. You can put a towel under your left / right thigh just above your kneecap for comfort. 3. Relax your leg muscles and allow gravity to straighten your knee (extension). You should feel a stretch behind your left / right knee. 4. Hold this position for __________ seconds. 5. Scoot up so your knee is supported between repetitions. Repeat __________ times. Complete this exercise __________ times a day. Knee flexion, active 1. Lie on your back with both legs straight. If this causes back discomfort, bend your left / right knee so your foot is flat on the floor. 2. Slowly slide your left / right heel back toward your buttocks. Stop when you feel a gentle stretch in the front of your knee or thigh (flexion). 3. Hold this position for __________ seconds. 4. Slowly slide your left / right heel back to the  starting position. Repeat __________ times. Complete this exercise __________ times a day.   Quadriceps stretch, prone 1. Lie on your abdomen on a firm surface, such as a bed or padded floor. 2. Bend your left / right knee and hold your ankle. If you cannot reach your ankle or pant leg, loop a belt around your foot and grab the belt instead. 3. Gently pull your heel toward your buttocks. Your knee should not slide out to the side. You should feel a stretch in the front of your thigh and knee (quadriceps). 4. Hold this position for __________ seconds. Repeat __________ times. Complete this exercise __________ times a day.   Hamstring, supine 1. Lie on your back (supine position). 2. Loop a belt or towel over the ball of your left / right foot. The ball of your foot is on the walking surface, right under your toes. 3. Straighten your left / right knee and slowly pull on the belt to raise your leg until you feel a gentle stretch behind your knee (hamstring). ? Do not let your knee bend while you do this. ? Keep your other leg flat on the floor. 4. Hold this position for __________ seconds. Repeat __________ times. Complete this exercise __________ times a day. Strengthening exercises These exercises build strength and endurance in your knee. Endurance is the ability to use your muscles for a long time, even after they get tired. Quadriceps, isometric This exercise stretches the muscles in front of your thigh (  quadriceps) without moving your knee joint (isometric). 1. Lie on your back with your left / right leg extended and your other knee bent. Put a rolled towel or small pillow under your knee if told by your health care provider. 2. Slowly tense the muscles in the front of your left / right thigh. You should see your kneecap slide up toward your hip or see increased dimpling just above the knee. This motion will push the back of the knee toward the floor. 3. For __________ seconds, hold the  muscle as tight as you can without increasing your pain. 4. Relax the muscles slowly and completely. Repeat __________ times. Complete this exercise __________ times a day.   Straight leg raises This exercise stretches the muscles in front of your thigh (quadriceps) and the muscles that move your hips (hip flexors). 1. Lie on your back with your left / right leg extended and your other knee bent. 2. Tense the muscles in the front of your left / right thigh. You should see your kneecap slide up or see increased dimpling just above the knee. Your thigh may even shake a bit. 3. Keep these muscles tight as you raise your leg 4-6 inches (10-15 cm) off the floor. Do not let your knee bend. 4. Hold this position for __________ seconds. 5. Keep these muscles tense as you lower your leg. 6. Relax your muscles slowly and completely after each repetition. Repeat __________ times. Complete this exercise __________ times a day. Hamstring, isometric 1. Lie on your back on a firm surface. 2. Bend your left / right knee about __________ degrees. 3. Dig your left / right heel into the surface as if you are trying to pull it toward your buttocks. Tighten the muscles in the back of your thighs (hamstring) to "dig" as hard as you can without increasing any pain. 4. Hold this position for __________ seconds. 5. Release the tension gradually and allow your muscles to relax completely for __________ seconds after each repetition. Repeat __________ times. Complete this exercise __________ times a day. Hamstring curls If told by your health care provider, do this exercise while wearing ankle weights. Begin with __________ lb weights. Then increase the weight by 1 lb (0.5 kg) increments. Do not wear ankle weights that are more than __________ lb. 1. Lie on your abdomen with your legs straight. 2. Bend your left / right knee as far as you can without feeling pain. Keep your hips flat against the floor. 3. Hold this  position for __________ seconds. 4. Slowly lower your leg to the starting position. Repeat __________ times. Complete this exercise __________ times a day.   Squats This exercise strengthens the muscles in front of your thigh and knee (quadriceps). 1. Stand in front of a table, with your feet and knees pointing straight ahead. You may rest your hands on the table for balance but not for support. 2. Slowly bend your knees and lower your hips like you are going to sit in a chair. ? Keep your weight over your heels, not over your toes. ? Keep your lower legs upright so they are parallel with the table legs. ? Do not let your hips go lower than your knees. ? Do not bend lower than told by your health care provider. ? If your knee pain increases, do not bend as low. 3. Hold the squat position for __________ seconds. 4. Slowly push with your legs to return to standing. Do not use your hands to  pull yourself to standing. Repeat __________ times. Complete this exercise __________ times a day. Wall slides This exercise strengthens the muscles in front of your thigh and knee (quadriceps). 1. Lean your back against a smooth wall or door, and walk your feet out 18-24 inches (46-61 cm) from it. 2. Place your feet hip-width apart. 3. Slowly slide down the wall or door until your knees bend __________ degrees. Keep your knees over your heels, not over your toes. Keep your knees in line with your hips. 4. Hold this position for __________ seconds. Repeat __________ times. Complete this exercise __________ times a day.   Straight leg raises This exercise strengthens the muscles that rotate the leg at the hip and move it away from your body (hip abductors). 1. Lie on your side with your left / right leg in the top position. Lie so your head, shoulder, knee, and hip line up. You may bend your bottom knee to help you keep your balance. 2. Roll your hips slightly forward so your hips are stacked directly over  each other and your left / right knee is facing forward. 3. Leading with your heel, lift your top leg 4-6 inches (10-15 cm). You should feel the muscles in your outer hip lifting. ? Do not let your foot drift forward. ? Do not let your knee roll toward the ceiling. 4. Hold this position for __________ seconds. 5. Slowly return your leg to the starting position. 6. Let your muscles relax completely after each repetition. Repeat __________ times. Complete this exercise __________ times a day.   Straight leg raises This exercise stretches the muscles that move your hips away from the front of the pelvis (hip extensors). 1. Lie on your abdomen on a firm surface. You can put a pillow under your hips if that is more comfortable. 2. Tense the muscles in your buttocks and lift your left / right leg about 4-6 inches (10-15 cm). Keep your knee straight as you lift your leg. 3. Hold this position for __________ seconds. 4. Slowly lower your leg to the starting position. 5. Let your leg relax completely after each repetition. Repeat __________ times. Complete this exercise __________ times a day. This information is not intended to replace advice given to you by your health care provider. Make sure you discuss any questions you have with your health care provider. Document Revised: 05/07/2018 Document Reviewed: 05/07/2018 Elsevier Patient Education  2021 [employer].

## 2020-09-29 NOTE — Telephone Encounter (Signed)
Need all labs [facility] please ROI signed per pt Thanks

## 2020-09-29 NOTE — Addendum Note (Signed)
Addended by: Orland Mustard on: 09/29/2020 12:47 PM   Modules accepted: Orders

## 2020-09-30 NOTE — Telephone Encounter (Signed)
ROI faxed to [facility] records

## 2020-10-07 DIAGNOSIS — M25512 Pain in left shoulder: Secondary | ICD-10-CM | POA: Diagnosis not present

## 2020-10-07 DIAGNOSIS — M542 Cervicalgia: Secondary | ICD-10-CM | POA: Diagnosis not present

## 2020-10-11 ENCOUNTER — Other Ambulatory Visit: Payer: Self-pay | Admitting: Internal Medicine

## 2020-10-11 DIAGNOSIS — F32A Depression, unspecified: Secondary | ICD-10-CM

## 2020-10-12 ENCOUNTER — Telehealth: Payer: Self-pay | Admitting: Internal Medicine

## 2020-10-12 NOTE — Telephone Encounter (Signed)
Patient called and said she received lab orders that she doesn't know why. She is confused because it has all kinds of codes. She would like to know what to do with it.

## 2020-10-12 NOTE — Telephone Encounter (Signed)
Rejection Reason - Patient Declined - Patient declined to schedule appointment at this time...mb" Taylor Mccarthy said on Oct 12, 2020 8:57 AM  [facility] derm

## 2020-10-13 ENCOUNTER — Other Ambulatory Visit: Payer: Self-pay | Admitting: Internal Medicine

## 2020-10-13 DIAGNOSIS — E039 Hypothyroidism, unspecified: Secondary | ICD-10-CM

## 2020-10-13 NOTE — Telephone Encounter (Signed)
Yes fasting b/c she never had them done

## 2020-10-13 NOTE — Telephone Encounter (Signed)
Can see [facility] labs ordered in 09/2019. Would you like her to have these done now?

## 2020-10-14 NOTE — Telephone Encounter (Signed)
Left message to return call 

## 2020-10-18 NOTE — Telephone Encounter (Signed)
Patient informed and verbalized understanding.  She will go have these done fasting.

## 2020-11-12 ENCOUNTER — Other Ambulatory Visit: Payer: Self-pay | Admitting: Physician Assistant

## 2020-11-12 NOTE — Telephone Encounter (Signed)
Scheduled 4/22

## 2020-11-12 NOTE — Telephone Encounter (Signed)
Spoke with Patient will call back to schedule later today

## 2020-11-12 NOTE — Telephone Encounter (Signed)
Please schedule 6 month F/U appointment. Thank you! 

## 2020-11-19 ENCOUNTER — Ambulatory Visit: Payer: Medicare HMO | Admitting: Physician Assistant

## 2020-12-02 ENCOUNTER — Ambulatory Visit: Payer: Medicare HMO | Admitting: Physician Assistant

## 2020-12-14 NOTE — Progress Notes (Incomplete)
Office Visit    Patient Name: Taylor Mccarthy Date of Encounter: 12/14/2020  PCP:  McLean-Scocuzza, Nino Glow, MD   [place]  Cardiologist:  Kathlyn [place], MD  Advanced Practice Provider:  No care team member to display Electrophysiologist:  None  {Press F2 to show EP APP, CHF, sleep or structural heart MD               :[id number]}  { Click here to update then REFRESH NOTE - MD (PCP) or APP (Team Member)  Change PCP Type for MD, Specialty for APP is either Cardiology or Clinical Cardiac Electrophysiology  :[id number]}   Chief Complaint    No chief complaint on file.   80 y.o. female   Past Medical History    Past Medical History:  Diagnosis Date  . Atrial fibrillation, persistent ([place])   . Cancer [facility])    colon cancer 1999 s/p resection   . History of colon polyps   . Insomnia   . Low back pain   . Mitral regurgitation   . Pap smear abnormality of cervix with ASCUS favoring benign   . Sciatica   . Vitamin D deficiency    Past Surgical History:  Procedure Laterality Date  . COLON RESECTION     1999 for cancer   . TONSILLECTOMY    . WISDOM TOOTH EXTRACTION      Allergies  Allergies  Allergen Reactions  . Morpholine Salicylate   . Codeine     Nausea    . Other     Tree pollen      History of Present Illness    Taylor Mccarthy is a 80 y.o. female with PMH as above. ***  Home Medications   Current Outpatient Medications  Medication Instructions  . Calcium Carbonate (CALCIUM 600 PO) 600 mg, Oral, 2 times daily  . metoprolol succinate (TOPROL-XL) 50 MG 24 hr tablet TAKE 1 TABLET BY MOUTH ONCE DAILY.  . Multiple Vitamin (MULTIVITAMIN) tablet 1 tablet, Oral, Daily  . rivaroxaban (XARELTO) 20 MG TABS tablet TAKE (1) TABLET BY MOUTH DAILY WITH SUPPER.  . sertraline (ZOLOFT) 50 MG tablet TAKE 1 TABLET EVERY DAY IN THE MORNING  . SYNTHROID 75 MCG tablet TAKE 1 TAB (75 MCG) DAILY BEFORE BREAKFAST. NO FURTHER REFILLS NEEDS APPT IN  OFFICE/CALL FOR APPOINTMENT ASAP     Review of Systems    ***.   All other systems reviewed and are otherwise negative except as noted above.  Physical Exam    VS:  There were no vitals taken for this visit. , BMI There is no height or weight on file to calculate BMI. GEN: Well nourished, well developed, in no acute distress. HEENT: normal. Neck: Supple, no JVD, carotid bruits, or masses. Cardiac: RRR, no murmurs, rubs, or gallops. No clubbing, cyanosis, edema.  Radials/DP/PT 2+ and equal bilaterally.  Respiratory:  Respirations regular and unlabored, clear to auscultation bilaterally. GI: Soft, nontender, nondistended, BS + x 4. MS: no deformity or atrophy. Skin: warm and dry, no rash. Neuro:  Strength and sensation are intact. Psych: Normal affect.  Accessory Clinical Findings    ECG personally reviewed by me today - *** - no acute changes.  VITALS Reviewed today   Temp Readings from Last 3 Encounters:  09/29/20 98.3 F (36.8 C) (Oral)  02/27/19 (!) 97.4 F (36.3 C)  10/27/18 98.5 F (36.9 C) (Oral)   BP Readings from Last 3 Encounters:  09/29/20 116/78  05/18/20 130/90  02/27/19 (!) 136/94   Pulse Readings from Last 3 Encounters:  09/29/20 100  05/18/20 82  02/27/19 86    Wt Readings from Last 3 Encounters:  09/29/20 167 lb 3.2 oz (75.8 kg)  05/18/20 165 lb 4 oz (75 kg)  04/22/20 160 lb (72.6 kg)     LABS  reviewed today   Lab Results  Component Value Date   WBC 7.2 02/27/2019   HGB 15.7 02/27/2019   HCT 45.1 02/27/2019   MCV 103 (H) 02/27/2019   PLT 232 02/27/2019   Lab Results  Component Value Date   CREATININE 0.64 02/27/2019   BUN 15 02/27/2019   NA 142 02/27/2019   K 4.7 02/27/2019   CL 103 02/27/2019   CO2 19 (L) 02/27/2019   Lab Results  Component Value Date   ALT 26 06/03/2018   AST 24 06/03/2018   ALKPHOS 86 06/03/2018   BILITOT 0.8 06/03/2018   Lab Results  Component Value Date   CHOL 198 06/03/2018   HDL 75 06/03/2018    LDLCALC 98 06/03/2018   TRIG 123 06/03/2018    No results found for: HGBA1C Lab Results  Component Value Date   TSH 3.640 06/03/2018     STUDIES/PROCEDURES reviewed today   ***  Assessment & Plan    ***  Medication changes: *** Labs ordered: *** Studies / Imaging ordered: *** Future considerations: *** Disposition: ***  *Please be aware that the above documentation was completed voice recognition software and may contain dictation errors.    Total time spent with patient today *** minutes. This includes reviewing records, evaluating the patient, and coordinating care. Face-to-face time >50%.    Arvil Chaco, PA-C 12/14/2020

## 2020-12-14 NOTE — Progress Notes (Signed)
Office Visit    Patient Name: Taylor Mccarthy Date of Encounter: 12/15/2020  PCP:  McLean-Scocuzza, Nino Glow, [place]  Cardiologist:  Kathlyn Sacramento, MD  Advanced Practice Provider:  No care team member to display Electrophysiologist:  None :[id number]}   Chief Complaint    Chief Complaint  Patient presents with  . Follow-up    Follow up and patient c/o bilateral ankle swelling for about 2 weeks. Medications verbally reviewed with patient.     80 y.o. female with history of atrial fibrillation, moderate MR, moderate TR, PVCs, h/o presyncope, colon cancer, hypothyroidism, and here today for follow-up.  Past Medical History    Past Medical History:  Diagnosis Date  . Atrial fibrillation, persistent ([place])   . Cancer [facility])    colon cancer 1999 s/p resection   . History of colon polyps   . Insomnia   . Low back pain   . Mitral regurgitation   . Pap smear abnormality of cervix with ASCUS favoring benign   . Sciatica   . Vitamin D deficiency    Past Surgical History:  Procedure Laterality Date  . COLON RESECTION     1999 for cancer   . TONSILLECTOMY    . WISDOM TOOTH EXTRACTION      Allergies  Allergies  Allergen Reactions  . Morpholine Salicylate   . Codeine     Nausea    . Other     Tree pollen      History of Present Illness    Taylor Mccarthy is a 80 y.o. female with PMH as above.  She was previously evaluated by Dr. Rockey Situ.   She has known history of hypothyroidism and permanent atrial fibrillation on long-term anticoagulation with Xarelto.  She has reported undergoing DCCV twice in the past, which were failed attempts at restoration of NSR.  Atrial fibrillation has been treated with anticoagulation and rate control.   Seen 01/2019 with presyncope/lightheadedness.  She was noted to have frequent PVCs on EKG.  Toprol uptitrated to 50 mg daily.  Echo was repeated and found stable to 2018.  Seen via virtual visit 08/19/2019 and  doing well from a cardiac standpoint.  Dizziness had improved.  She reported 1 mechanical fall 6 weeks prior, which was attributed to sleepwalking to the restroom and falling sideways over the bathtub.  She did not hit her head or seek medical attention.  05/18/2020 echo showed EF 45 to 50%, reduced from previous echo.  LV was mildly decreased.  The LV also demonstrated global hypokinesis.  There was mild LV hypertrophy.  RV SF low normal.  The RV was severely enlarged with normal pulmonary artery systolic pressure.  She also had severe LAE/RAE, moderate to severe MR, moderate holosystolic prolapse of both leaflets of the mitral valve, severe TR, RAP 3 mmHg.  Estimated PASP 37 to 50 mmHg.   At the time of her last visit, she reported discoloration of both feet without claudication.  She was recommended to continue to wear support stockings.  It was noted that her ventricular rate was more controlled since increasing to Toprol 50 mg once daily. No further ischemic workup recommended. Follow-up recommended in 6 months.   Today, 12/15/2020, she returns to clinic and notes that she is doing poorly due to ongoing symptoms.  She is concerned regarding her lower extremities.  She has noted increase in swelling and ongoing discoloration.  She states the swelling started 2 weeks ago.  She  reports ongoing paresthesias, as reported in the past per Christell Faith, PA-C documentation.  In addition, she reports cold lower extremities.  At times, she experiences numbness/discomfort.  She reports her bilateral lower extremities are always cyanotic/erythematous in color.  She remains in atrial fibrillation with PVCs noted on today's exam.  She does not feel her atrial fibrillation or PVCs.  She reports issues with balance and a fear of falling.  On further questioning, she reports dizziness.  She uses a cane when at home but does not have a walker.  She is aware that if she falls and hits her head she should go to the emergency  department.  She has dyspnea but no shortness of breath at rest.  She denies any chest pain or pressure.  She reports trying to limit her salt and rarely adding salt to her food.  She does not eat red meat and eat small meals.  She does not eat out at restaurants.  She reports no appetite, though this seems to be a chronic finding, as she states she is typically in " not an eater."  She reports weight gain.  She notes medication compliance and denies any signs or symptoms of bleeding.  Home Medications   Current Outpatient Medications  Medication Instructions  . Calcium Carbonate (CALCIUM 600 PO) 600 mg, Oral, 2 times daily  . metoprolol succinate (TOPROL-XL) 50 MG 24 hr tablet TAKE 1 TABLET BY MOUTH ONCE DAILY.  . Multiple Vitamin (MULTIVITAMIN) tablet 1 tablet, Oral, Daily  . rivaroxaban (XARELTO) 20 MG TABS tablet TAKE (1) TABLET BY MOUTH DAILY WITH SUPPER.  . sertraline (ZOLOFT) 50 MG tablet TAKE 1 TABLET EVERY DAY IN THE MORNING  . SYNTHROID 75 MCG tablet TAKE 1 TAB (75 MCG) DAILY BEFORE BREAKFAST. NO FURTHER REFILLS NEEDS APPT IN OFFICE/CALL FOR APPOINTMENT ASAP     Review of Systems    She reports dyspnea but denies shortness of breath at rest.  She has had LEE x2 weeks.  She reports ongoing lower extremity discoloration with associated numbness/tingling and cold lower extremities.  She reports presyncope but no recent syncope or falls since her last visit.  She is unsteady on her feet.  She notes early satiety, though this may be a chronic finding. She denies chest pain, palpitations, pnd, orthopnea, n, v, or early satiety.    All other systems reviewed and are otherwise negative except as noted above.  Physical Exam    VS:  BP (!) 146/96 (BP Location: Left Arm, Patient Position: Sitting, Cuff Size: Normal)   Pulse 82   Ht 5' 4.5" (1.638 m)   Wt 168 lb (76.2 kg)   SpO2 93%   BMI 28.39 kg/m  , BMI Body mass index is 28.39 kg/m. GEN: Well nourished, well developed, in no acute  distress. HEENT: normal. Neck: Supple, no JVD, carotid bruits, or masses. Cardiac: IRIR, 1/6 systolic RUSB murmur and 1/4 daistolic LLSB and at the apex. No rubs, or gallops. No clubbing.  Bilateral lower extremity edema moderate to 1+ and with pedal edema worse than that extending up the tibia.  Radials 2+ bilaterally./DP/PT 1+ and very faintl by Doppler US.  Respiratory:  Respirations regular and unlabored, bibasilar reduced breath sounds GI: Soft, nontender, nondistended, BS + x 4. MS: no deformity or atrophy. Skin: warm and dry, no rash.  Bilateral lower extremities erythematous with some areas cyanotic /discoloration of bilateral lower extremities from the lower tibia into the bilateral feet. Neuro:  Strength and sensation are  intact. Psych: Normal affect.  Accessory Clinical Findings    ECG personally reviewed by me today -atrial fibrillation with PVCs and T wave inversion noted in II and seen on prior EKGs- no acute changes.  VITALS Reviewed today   Temp Readings from Last 3 Encounters:  09/29/20 98.3 F (36.8 C) (Oral)  02/27/19 (!) 97.4 F (36.3 C)  10/27/18 98.5 F (36.9 C) (Oral)   BP Readings from Last 3 Encounters:  12/15/20 (!) 146/96  09/29/20 116/78  05/18/20 130/90   Pulse Readings from Last 3 Encounters:  12/15/20 82  09/29/20 100  05/18/20 82    Wt Readings from Last 3 Encounters:  12/15/20 168 lb (76.2 kg)  09/29/20 167 lb 3.2 oz (75.8 kg)  05/18/20 165 lb 4 oz (75 kg)     LABS  reviewed today   Lab Results  Component Value Date   WBC 7.2 02/27/2019   HGB 15.7 02/27/2019   HCT 45.1 02/27/2019   MCV 103 (H) 02/27/2019   PLT 232 02/27/2019   Lab Results  Component Value Date   CREATININE 0.64 02/27/2019   BUN 15 02/27/2019   NA 142 02/27/2019   K 4.7 02/27/2019   CL 103 02/27/2019   CO2 19 (L) 02/27/2019   Lab Results  Component Value Date   ALT 26 06/03/2018   AST 24 06/03/2018   ALKPHOS 86 06/03/2018   BILITOT 0.8 06/03/2018   Lab  Results  Component Value Date   CHOL 198 06/03/2018   HDL 75 06/03/2018   LDLCALC 98 06/03/2018   TRIG 123 06/03/2018    No results found for: HGBA1C Lab Results  Component Value Date   TSH 3.640 06/03/2018     STUDIES/PROCEDURES reviewed today   Echo 05/18/20 1. Left ventricular ejection fraction, by estimation, is 45 to 50%. The  left ventricle has mildly decreased function. The left ventricle  demonstrates global hypokinesis. There is mild left ventricular  hypertrophy. Left ventricular diastolic parameters  are indeterminate.  2. Right ventricular systolic function is low normal. The right  ventricular size is severely enlarged. There is normal pulmonary artery  systolic pressure.  3. Left atrial size was severely dilated.  4. Right atrial size was severely dilated.  5. The mitral valve is abnormal. Moderate to severe mitral valve  regurgitation. There is moderate holosystolic prolapse of both leaflets of  the mitral valve.  6. Tricuspid valve regurgitation is severe.  7. The aortic valve is tricuspid. Aortic valve regurgitation is not  visualized. No aortic stenosis is present.  8. The inferior vena cava is normal in size with greater than 50%  respiratory variability, suggesting right atrial pressure of 3 mmHg.   Previous PAD dx? No  Previous surgical procedure? No  Pain with walking? No  Feet/toe relief with dangling? No  Painful, non-healing ulcers? No  Extremities discolored? Yes     Assessment & Plan    Permanent atrial fibrillation -- Remains in atrial fibrillation and reports no associated symptoms.  She is still tolerating Toprol XL 50 mg daily.  Ventricular rate in the 80s; however, given her dizziness, will hold off on any further increases to her BB today, especially given possible short course gentle diuresis as below. She denies any further falls.  No signs or symptoms of bleeding.  She remains on Xarelto.  Will recheck labs today to ensure  Cr and blood counts stable. If ventricular rate remains elevated at RTC, and if no further dizziness, consider escalation of  Toprol.  Also reassess if pt agreeable to monitoring, previously deferred this visit and in the past. Will defer for now.  AOC HFrEF --No CP. Reports DOE but no SOB at rest. Previous 04/2020 echo with reduced EF and LV global hypokinesis. Also noted was mild LVH, severe RVE, and severe biatrial enlargement. Discussed echo with Christell Faith, PA-C, given result note recorded at the time--> will discuss with MD after discussion with Thurmond Butts. Recommend repeat echo to be ordered at RTC if pt agreeable, deferred today per pt and given need to clarify with MD. Consider her valvular dz as contributing to her sx do presyncope and dizziness. If EF indeed reduced, further ischemic workup recommended. She does appear at least mildly volume up with bilateral LEE, elevated ventricular rate, and elevated BP. Will obtain a BMET and consider short course diuresis following labs. Discussed also with pt the recommendation for a repeat echo by 04/2021 or sooner, given her ongoing DOE and dizziness. Will reassess if agreeable to echo and +/- referred to our advanced HF team in the near future if indicated based on her echo findings and valvular dz.   Dizziness -- She reports dizziness and feeling unstable on her feet.  She is constantly afraid that she may fall.  Previously, Zio monitoring offered to patient but deferred and will continue to reassess given her PVCs on EKG today. Will get LE arterial studies, given her report of unsteadiness on feet with decreased pulses on exam. Will check a BMET to ensure electrolytes at goal with TSH / thyroid monitoring per PCP. Most recent echo 04/2020 as above with mildly reduced LVEF.  As above, will discuss with MD with tentative plan for repeat echo to reassess EF and valvular function 04/2021 or at RTC and further ischemic workup or referral to [facility] HF clinic if indicated  at that time.  Mitral and tricuspid regurgitation -- Stable moderate to severe on echo from 04/2020. Repeat echo to monitor, especially given ongoing report of dizziness as above.   PVCs -- Consider repeat monitoring given dizziness has returned and PVCs noted on EKG.  Continue Toprol-XL. Continue to reassess if agreeable to ambulatory cardiac monitor at RTC.  Medication changes: Pending BMET Labs ordered: BMET Studies / Imaging ordered: Bilateral LE arterial studies Future considerations: Zio, echo sooner than 04/2021 based on sx Disposition: RTC after LE studies, sooner if needed  *Please be aware that the above documentation was completed voice recognition software and may contain dictation errors.      Arvil Chaco, PA-C 12/15/2020

## 2020-12-15 ENCOUNTER — Encounter: Payer: Self-pay | Admitting: Physician Assistant

## 2020-12-15 ENCOUNTER — Other Ambulatory Visit: Payer: Self-pay

## 2020-12-15 ENCOUNTER — Ambulatory Visit: Payer: Medicare HMO | Admitting: Physician Assistant

## 2020-12-15 VITALS — BP 146/96 | HR 82 | Ht 64.5 in | Wt 168.0 lb

## 2020-12-15 DIAGNOSIS — I5020 Unspecified systolic (congestive) heart failure: Secondary | ICD-10-CM

## 2020-12-15 DIAGNOSIS — I071 Rheumatic tricuspid insufficiency: Secondary | ICD-10-CM | POA: Diagnosis not present

## 2020-12-15 DIAGNOSIS — Z85038 Personal history of other malignant neoplasm of large intestine: Secondary | ICD-10-CM | POA: Diagnosis not present

## 2020-12-15 DIAGNOSIS — I493 Ventricular premature depolarization: Secondary | ICD-10-CM | POA: Diagnosis not present

## 2020-12-15 DIAGNOSIS — I38 Endocarditis, valve unspecified: Secondary | ICD-10-CM

## 2020-12-15 DIAGNOSIS — I4821 Permanent atrial fibrillation: Secondary | ICD-10-CM

## 2020-12-15 DIAGNOSIS — I340 Nonrheumatic mitral (valve) insufficiency: Secondary | ICD-10-CM

## 2020-12-15 DIAGNOSIS — R42 Dizziness and giddiness: Secondary | ICD-10-CM | POA: Diagnosis not present

## 2020-12-15 DIAGNOSIS — R0989 Other specified symptoms and signs involving the circulatory and respiratory systems: Secondary | ICD-10-CM | POA: Diagnosis not present

## 2020-12-15 DIAGNOSIS — R269 Unspecified abnormalities of gait and mobility: Secondary | ICD-10-CM | POA: Diagnosis not present

## 2020-12-15 NOTE — Patient Instructions (Signed)
Medication Instructions:   Your physician recommends that you continue on your current medications as directed. Please refer to the Current Medication list given to you today.  *If you need a refill on your cardiac medications before your next appointment, please call your pharmacy*   Lab Work:  1. Your physician recommends that you have lab work today - BMP  If you have labs (blood work) drawn today and your tests are completely normal, you will receive your results only by: Marland Kitchen MyChart Message (if you have MyChart) OR . A paper copy in the mail If you have any lab test that is abnormal or we need to change your treatment, we will call you to review the results.   Testing/Procedures:  1. Your physician has requested that you have a lower extremity arterial exercise duplex. During this test, exercise and ultrasound are used to evaluate arterial blood flow in the legs. Allow one hour for this exam. There are no restrictions or special instructions.  Follow-Up: At [facility], you and your health needs are our priority.  As part of our continuing mission to provide you with exceptional heart care, we have created designated Provider Care Teams.  These Care Teams include your primary Cardiologist (physician) and Advanced Practice Providers (APPs -  Physician Assistants and Nurse Practitioners) who all work together to provide you with the care you need, when you need it.  We recommend signing up for the patient portal called "MyChart".  Sign up information is provided on this After Visit Summary.  MyChart is used to connect with patients for Virtual Visits (Telemedicine).  Patients are able to view lab/test results, encounter notes, upcoming appointments, etc.  Non-urgent messages can be sent to your provider as well.   To learn more about what you can do with MyChart, go to [email or web address].    Your next appointment:    After  lower extremity arterial  The format for your next  appointment:   In Person  Provider:    You may see Kathlyn Sacramento, MDor    Marrianne Mood, PA-C

## 2020-12-16 LAB — BASIC METABOLIC PANEL
BUN/Creatinine Ratio: 20 (ref 12–28)
BUN: 12 mg/dL (ref 8–27)
CO2: 23 mmol/L (ref 20–29)
Calcium: 9.8 mg/dL (ref 8.7–10.3)
Chloride: 103 mmol/L (ref 96–106)
Creatinine, Ser: 0.6 mg/dL (ref 0.57–1.00)
Glucose: 88 mg/dL (ref 65–99)
Potassium: 4.2 mmol/L (ref 3.5–5.2)
Sodium: 141 mmol/L (ref 134–144)
eGFR: 90 mL/min/{1.73_m2} (ref >59)

## 2020-12-23 ENCOUNTER — Telehealth: Payer: Self-pay | Admitting: *Deleted

## 2020-12-23 DIAGNOSIS — R42 Dizziness and giddiness: Secondary | ICD-10-CM

## 2020-12-23 DIAGNOSIS — Z79899 Other long term (current) drug therapy: Secondary | ICD-10-CM

## 2020-12-23 DIAGNOSIS — I5023 Acute on chronic systolic (congestive) heart failure: Secondary | ICD-10-CM

## 2020-12-23 DIAGNOSIS — I4819 Other persistent atrial fibrillation: Secondary | ICD-10-CM

## 2020-12-23 DIAGNOSIS — R0609 Other forms of dyspnea: Secondary | ICD-10-CM

## 2020-12-23 DIAGNOSIS — R0600 Dyspnea, unspecified: Secondary | ICD-10-CM

## 2020-12-23 MED ORDER — FUROSEMIDE 20 MG PO TABS: 20.0000 mg | ORAL_TABLET | ORAL | 2 refills | Status: DC

## 2020-12-23 NOTE — Telephone Encounter (Signed)
Attempted to call pt. No answer. Lmtcb.  

## 2020-12-23 NOTE — Telephone Encounter (Signed)
-----   Message from Arvil Chaco, PA-C sent at 12/17/2020  1:08 PM EDT ----- Good news - Kidney function stable - Potassium at goal  Recommendations: Start Lasix 20 mg daily 3 times per week for swelling with repeat BMET in 2 weeks.  Given her report of dizziness during clinic, she should stop taking the Lasix and notify her if this dizziness worsens with Lasix.    She had indicated wanting to consolidate trips into the office, given how far away she lives from the clinic.  If we are still not able to fit her and for an echo on the same day as her bilateral lower extremity studies, can we push up her echo to sooner than 04/2021 (which is the current plan)?

## 2020-12-23 NOTE — Telephone Encounter (Signed)
Spoke to pt. Notified of lab results and provider's recc.  Pt voiced understanding.  She will start Lasix 20mg  daily every Monday, Wednesday, Friday.  Then will have repeat BMET in 2 weeks: [place] her LE study appt 01/10/21 (just over 2 weeks). Lab orders placed. Rx sent to pharmacy.  Pt understands to contact office if dizziness worsens w/ Lasix.   LE arterial study scheduled 6/13, and per scheduling unable to add on echo same day.  I do not see current echo ordered. Will clarify for order and will have scheduling set up for pt prior to 04/2021.

## 2020-12-24 NOTE — Telephone Encounter (Signed)
If we are able to get it scheduled same day as her LE studies, otherwise will plan for echo in October! I am just hoping to reduce trips for her.

## 2020-12-31 ENCOUNTER — Other Ambulatory Visit: Payer: Self-pay | Admitting: Physician Assistant

## 2020-12-31 ENCOUNTER — Telehealth: Payer: Self-pay

## 2020-12-31 DIAGNOSIS — R0989 Other specified symptoms and signs involving the circulatory and respiratory systems: Secondary | ICD-10-CM

## 2020-12-31 NOTE — Telephone Encounter (Signed)
Beth from Access nurse called and states that the patient was rude to her at the end of the call because she could not get transportation to be seen for possible bladder infection. She states that she had given her all the options that she could but the pt only wants medication-no visit. FYI

## 2020-12-31 NOTE — Telephone Encounter (Signed)
Unable to leave message for patient to return call back.VM box is full

## 2020-12-31 NOTE — Telephone Encounter (Signed)
Pt called and states that she has had symptoms of Cystitis since this morning. She is having urinary burning. She wanted Dr [place] to prescribe her something. I let her know that we did not have any appts avail until Monday. She stated that she would be too sick by then. I suggested the urgent care and she states that she lives too far from an UC or our office to have an appt. She was frustrated and only wanted medication called in. She asked to speak with a  Nurse and I transferred to [facility] at Access Nurse. I let Beckie Busing know that we do not have any appts avail until Monday.

## 2020-12-31 NOTE — Telephone Encounter (Signed)
Noted, will await the note from the access nurse

## 2020-12-31 NOTE — Telephone Encounter (Signed)
For your information  Patient has been rude on the phone with both Access nurse and our front office.   Patient declines work up completely. Declines leaving a sample, virtual visit, in person visit, ED visit, or urgent care. Patient informed Ailene Ravel that she was not willing to travel anywhere and then informed access nurse that she had no transportation.   Patient was informed by both front office and access nurse that a urine sample will be needed to treat her. Patient declined.

## 2020-12-31 NOTE — Telephone Encounter (Signed)
Spoke to pt, notified we do not have any openings to schedule Echo same day but I will verify with scheduling.  Otherwise we will schedule in October. Pt's last Echo was 05/18/20 and pt wants to make sure it is scheduled prior to that date for insurance purposes.   Pt aware scheduling will call to schedule Echo.

## 2020-12-31 NOTE — Telephone Encounter (Signed)
Please see subsequent phone note 5/26 for further detail.

## 2020-12-31 NOTE — Telephone Encounter (Signed)
We do not prescribe any medication without evaluation/appt so she can, for UTI always we have to culture to be sure A she has one and B we are treating with medication  Schedule an appointment  Visit urgent care to be seen

## 2021-01-04 DIAGNOSIS — N3091 Cystitis, unspecified with hematuria: Secondary | ICD-10-CM | POA: Diagnosis not present

## 2021-01-06 ENCOUNTER — Telehealth: Payer: Self-pay | Admitting: Internal Medicine

## 2021-01-06 NOTE — Telephone Encounter (Signed)
pt needs a refill on rivaroxaban (XARELTO) 20 MG TABS tablet Sent to [facility]

## 2021-01-07 ENCOUNTER — Other Ambulatory Visit: Payer: Self-pay

## 2021-01-07 MED ORDER — RIVAROXABAN 20 MG PO TABS: ORAL_TABLET | ORAL | 1 refills | Status: DC

## 2021-01-07 NOTE — Telephone Encounter (Signed)
Rx refilled.

## 2021-01-10 ENCOUNTER — Other Ambulatory Visit
Admission: RE | Admit: 2021-01-10 | Discharge: 2021-01-10 | Disposition: A | Discharge status: Home / Self Care | Payer: Medicare HMO | Attending: Cardiovascular Disease | Admitting: Cardiovascular Disease

## 2021-01-10 ENCOUNTER — Ambulatory Visit (INDEPENDENT_AMBULATORY_CARE_PROVIDER_SITE_OTHER): Payer: Medicare HMO

## 2021-01-10 ENCOUNTER — Other Ambulatory Visit: Payer: Self-pay

## 2021-01-10 ENCOUNTER — Telehealth: Payer: Self-pay | Admitting: Internal Medicine

## 2021-01-10 DIAGNOSIS — Z79899 Other long term (current) drug therapy: Secondary | ICD-10-CM

## 2021-01-10 DIAGNOSIS — R0989 Other specified symptoms and signs involving the circulatory and respiratory systems: Secondary | ICD-10-CM

## 2021-01-10 DIAGNOSIS — I4819 Other persistent atrial fibrillation: Secondary | ICD-10-CM | POA: Diagnosis not present

## 2021-01-10 LAB — BASIC METABOLIC PANEL
Anion gap: 7 (ref 5–15)
BUN: 11 mg/dL (ref 8–23)
CO2: 25 mmol/L (ref 22–32)
Calcium: 9.5 mg/dL (ref 8.9–10.3)
Chloride: 105 mmol/L (ref 98–111)
Creatinine, Ser: 0.56 mg/dL (ref 0.44–1.00)
GFR, Estimated: >60 mL/min (ref >60)
Glucose, Bld: 100 mg/dL — ABNORMAL HIGH (ref 70–99)
Potassium: 3.9 mmol/L (ref 3.5–5.1)
Sodium: 137 mmol/L (ref 135–145)

## 2021-01-10 NOTE — Telephone Encounter (Signed)
Pam from [facility] called to followup on a refill request for metoprolol succinate (TOPROL-XL) 50 MG 24 hr tablet. It was sent over on June 6th and they are following up.

## 2021-01-11 ENCOUNTER — Other Ambulatory Visit: Payer: Self-pay

## 2021-01-11 NOTE — Telephone Encounter (Signed)
Called and informed that this medication is filled by cardiology. Patient see [facility]. They state they will re-send the prescription to cardiology

## 2021-01-11 NOTE — Telephone Encounter (Signed)
To Taylor Mood, PA to advise regarding her follow up appointment. Per her AVS at check out on 12/15/20- she will need to follow up after her lower arterial study.   The patient has no follow up scheduled. She had her lower arterial study done on 01/10/21. Her echo is scheduled for 04/29/21.

## 2021-01-11 NOTE — Telephone Encounter (Signed)
Scheduled echo in September.     Patient wants to know if fu visit is needed and if so can she do a virtual visit as she lives an hour away.

## 2021-01-12 NOTE — Telephone Encounter (Signed)
Bumgarner, Saunders Glance  Sent: Wed January 12, 2021  9:57 AM  To: Emily Filbert, RN; Mickle Plumb, Jacquelyn D, PA-C  Cc: P Cv Div Burl Scheduling          Message  Patient called yesterday to request cancelling her follow-up. Patient lives 1 hour away and does not want to drive back unless absolutely neccessary. Patient stated she would do a virtual visit if one is needed

## 2021-01-13 ENCOUNTER — Ambulatory Visit: Payer: Medicare HMO

## 2021-01-13 NOTE — Telephone Encounter (Signed)
Fwd to triage to call patient first to discuss medications.

## 2021-01-14 ENCOUNTER — Ambulatory Visit: Payer: Medicare HMO | Admitting: Physician Assistant

## 2021-01-14 NOTE — Telephone Encounter (Signed)
From: Arvil Chaco, PA-C  Sent: 01/12/2021  11:43 PM EDT  To: Saunders Glance Bumgarner, Cv Div Burl Scheduling    How does she feel on the lasix 3x per week regimen?  As long as we are able to get her echo, virtual visit to discuss the results should be fine as long as doing well on the lasix 3x per week regimen.

## 2021-01-14 NOTE — Telephone Encounter (Signed)
I called the patient regarding her lower arterial duplex results which were normal.  I inquired how the patient was doing taking her lasix (furosemide) 20 mg once a day on Mondays/ Wednesdays/ Friday. The patient's initial comment was "what is that?" I stated both Lasix & Furosemide to her. I advised her that one of our nurses called her on 12/23/20 and advised her she should be taking this medication.   The patient states- "oh I never picked that up, but my swelling is resolved so we are just not going to worry about that." She did however have her lab work (BMP) drawn on 01/10/21.  I inquired how her dizziness was doing and she advised this has been ongoing for some time but is not any worse "no one can seem to figure it out so I guess I will just have it until I die."  I advised the patient Malachi Bonds had received a message about her not wanting in office follow up appointments as she has to drive 1 hour. The patient is aware of her echo scheduled on 04/29/21 and is willing to keep this appointment. She is aware I will have scheduling reach out to her to set up a virtual visit for just after the echo is completed. She is agreeable with a virtual follow up appointment. - this can be with Marrianne Mood, PA or Dr. Fletcher Anon.    The patient advised she would not be coming to the office  Will forward to scheduling to reach out to the patient and to [place], [place] as an [place].

## 2021-02-08 DIAGNOSIS — R300 Dysuria: Secondary | ICD-10-CM | POA: Diagnosis not present

## 2021-02-08 DIAGNOSIS — N390 Urinary tract infection, site not specified: Secondary | ICD-10-CM | POA: Diagnosis not present

## 2021-02-21 ENCOUNTER — Telehealth: Payer: Self-pay | Admitting: Internal Medicine

## 2021-02-21 NOTE — Telephone Encounter (Signed)
Patient last seen 09/29/20, okay for referral?

## 2021-02-21 NOTE — Telephone Encounter (Signed)
Pt called she would like a referral placed to a GI in [place] so she can have a colonoscopy

## 2021-02-24 NOTE — Addendum Note (Signed)
Addended by: Orland Mustard on: 02/24/2021 09:25 AM   Modules accepted: Orders

## 2021-02-24 NOTE — Telephone Encounter (Signed)
My chart or call with info [facility] gi referral thanks

## 2021-03-02 ENCOUNTER — Ambulatory Visit: Payer: Medicare HMO | Admitting: Internal Medicine

## 2021-03-07 ENCOUNTER — Telehealth: Payer: Self-pay | Admitting: Internal Medicine

## 2021-03-07 ENCOUNTER — Telehealth: Payer: Self-pay | Admitting: Gastroenterology

## 2021-03-07 NOTE — Telephone Encounter (Signed)
Patient has been referred to our clinic for a colonoscopy and is very unhappy with our office policy on age. She is 80 yr old and has to have a regular doctors appt. Taylor Mccarthy is very angry and wants to know why and what the doctor is going to do during that appointment. She said, what's he going to do put something up my ass??? Patient then hung up on me.

## 2021-03-07 NOTE — Telephone Encounter (Signed)
The patient wants a call from Dr. Olivia Mackie . She stated someone called her to have her colonoscopy scheduled. However, they requested that she see the provider first before scheduling. The patient is a little upset regarding what they are asking her to do. She stated she lives and hour away and does not see that it is necessary.

## 2021-03-08 NOTE — Telephone Encounter (Signed)
No answer, no voicemail.   Calling to inform the Patient that the GI provider will need to evaluate her themselves before scheduling the procedure. Since Gi is specialty they will need to clear the Patient to have the procedure done. Their work up is different than Dr Olivia Mackie McLean-Scocuzza so She can not clear the Patient for the procedure.

## 2021-03-08 NOTE — Telephone Encounter (Signed)
Patient informed and verbalized understanding.  Patient given the number to call  Gi. She will call to get this scheduled and ask if they can do this virtually.

## 2021-03-26 ENCOUNTER — Other Ambulatory Visit: Payer: Self-pay | Admitting: Physician Assistant

## 2021-03-29 DIAGNOSIS — D2272 Melanocytic nevi of left lower limb, including hip: Secondary | ICD-10-CM | POA: Diagnosis not present

## 2021-03-29 DIAGNOSIS — L538 Other specified erythematous conditions: Secondary | ICD-10-CM | POA: Diagnosis not present

## 2021-03-29 DIAGNOSIS — L820 Inflamed seborrheic keratosis: Secondary | ICD-10-CM | POA: Diagnosis not present

## 2021-03-29 DIAGNOSIS — D2262 Melanocytic nevi of left upper limb, including shoulder: Secondary | ICD-10-CM | POA: Diagnosis not present

## 2021-03-29 DIAGNOSIS — D225 Melanocytic nevi of trunk: Secondary | ICD-10-CM | POA: Diagnosis not present

## 2021-03-29 DIAGNOSIS — D2261 Melanocytic nevi of right upper limb, including shoulder: Secondary | ICD-10-CM | POA: Diagnosis not present

## 2021-03-29 DIAGNOSIS — D485 Neoplasm of uncertain behavior of skin: Secondary | ICD-10-CM | POA: Diagnosis not present

## 2021-03-29 DIAGNOSIS — L570 Actinic keratosis: Secondary | ICD-10-CM | POA: Diagnosis not present

## 2021-04-06 ENCOUNTER — Other Ambulatory Visit: Payer: Self-pay | Admitting: Cardiovascular Disease

## 2021-04-06 DIAGNOSIS — N390 Urinary tract infection, site not specified: Secondary | ICD-10-CM | POA: Diagnosis not present

## 2021-04-06 DIAGNOSIS — R319 Hematuria, unspecified: Secondary | ICD-10-CM | POA: Diagnosis not present

## 2021-04-06 NOTE — Telephone Encounter (Signed)
Please review for refill, Thanks !  

## 2021-04-06 NOTE — Telephone Encounter (Signed)
Prescription refill request for Xarelto received.  Indication: afib  Last office visit: Mickle Plumb 12/15/2020 Weight: 76.2 kg  Age: 80 yo  Scr: 0.56 01/10/2021 CrCl: 93 ml/min   Refill sent.

## 2021-04-25 ENCOUNTER — Ambulatory Visit (INDEPENDENT_AMBULATORY_CARE_PROVIDER_SITE_OTHER): Payer: Medicare HMO

## 2021-04-25 VITALS — Ht 64.5 in | Wt 168.0 lb

## 2021-04-25 DIAGNOSIS — Z0000 Encounter for general adult medical examination without abnormal findings: Secondary | ICD-10-CM

## 2021-04-25 NOTE — Patient Instructions (Addendum)
  Ms. Vanvleck , Thank you for taking time to come for your Medicare Wellness Visit. I appreciate your ongoing commitment to your health goals. Please review the following plan we discussed and let me know if I can assist you in the future.   These are the goals we discussed:  Goals       Patient Stated     Maintain Healthy Lifestyle (pt-stated)      Stay active with gym exercises Healthy diet        This is a list of the screening recommended for you and due dates:  Health Maintenance  Topic Date Due   COVID-19 Vaccine (4 - Booster for Moderna series) 05/11/2021*   Zoster (Shingles) Vaccine (1 of 2) 07/25/2021*   Flu Shot  10/28/2021*   Tetanus Vaccine  04/25/2022*   DEXA scan (bone density measurement)  Completed   HPV Vaccine  Aged Out  *Topic was postponed. The date shown is not the original due date.     Advanced directive mailed per patient request.  Handicap request form mailed per patient request.

## 2021-04-25 NOTE — Progress Notes (Signed)
Subjective:   Taylor Mccarthy is a 80 y.o. female who presents for Medicare Annual (Subsequent) preventive examination.  Review of Systems    No ROS.  Medicare Wellness Virtual Visit.  Visual/audio telehealth visit, UTA vital signs.   See social history for additional risk factors.   Cardiac Risk Factors include: advanced age (>43men, >57 women)     Objective:    Today's Vitals   04/25/21 1406  Weight: 168 lb (76.2 kg)  Height: 5' 4.5" (1.638 m)   Body mass index is 28.39 kg/m.  Advanced Directives 04/25/2021 04/22/2020 04/21/2019 10/27/2018  Does Patient Have a Medical Advance Directive? No Yes Yes No  Type of Advance Directive - [place];Living will [place];Living will -  Does patient want to make changes to medical advance directive? Yes (MAU/Ambulatory/Procedural Areas - Information given) - No - Patient declined -  Copy of [place] in Chart? - No - copy requested No - copy requested -    Current Medications (verified) Outpatient Encounter Medications as of 04/25/2021  Medication Sig   Calcium Carbonate (CALCIUM 600 PO) Take 600 mg by mouth in the morning and at bedtime.   furosemide (LASIX) 20 MG tablet Take 1 tablet (20 mg total) by mouth every Monday, Wednesday, and Friday.   metoprolol succinate (TOPROL-XL) 50 MG 24 hr tablet TAKE 1 TABLET BY MOUTH ONCE DAILY. NEEDS OFFICE VISIT FOR FURTHER REFILLS.   Multiple Vitamin (MULTIVITAMIN) tablet Take 1 tablet by mouth daily.   sertraline (ZOLOFT) 50 MG tablet TAKE 1 TABLET EVERY DAY IN THE MORNING   SYNTHROID 75 MCG tablet TAKE 1 TAB (75 MCG) DAILY BEFORE BREAKFAST. NO FURTHER REFILLS NEEDS APPT IN OFFICE/CALL FOR APPOINTMENT ASAP   XARELTO 20 MG TABS tablet TAKE 1 TABLET EVERY DAY WITH SUPPER   No facility-administered encounter medications on file as of 04/25/2021.    Allergies (verified) Morpholine salicylate, Codeine, and Other   History: Past Medical History:   Diagnosis Date   Atrial fibrillation, persistent ([place])    Cancer ([place])    colon cancer 1999 s/p resection    History of colon polyps    Insomnia    Low back pain    Mitral regurgitation    Pap smear abnormality of cervix with ASCUS favoring benign    Sciatica    Vitamin D deficiency    Past Surgical History:  Procedure Laterality Date   COLON RESECTION     1999 for cancer    TONSILLECTOMY     WISDOM TOOTH EXTRACTION     Family History  Problem Relation Age of Onset   Heart disease Father    Hyperlipidemia Father    Osteopenia Sister    Social History   Socioeconomic History   Marital status: Divorced    Spouse name: Not on file   Number of children: Not on file   Years of education: Not on file   Highest education level: Not on file  Occupational History   Not on file  Tobacco Use   Smoking status: Former   Smokeless tobacco: Never   Tobacco comments:    former quit in 38s light smoker per pt   Vaping Use   Vaping Use: Never used  Substance and Sexual Activity   Alcohol use: Yes   Drug use: Not Currently   Sexual activity: Not Currently  Other Topics Concern   Not on file  Social History Narrative   Divorced    [employer]  grad    Moved from [place] to [place]   Former Office manager    No guns   Wears seat belts    Social Determinants of Radio broadcast assistant Strain: Low Risk    Difficulty of Paying Living Expenses: Not hard at all  Food Insecurity: No Food Insecurity   Worried About Charity fundraiser in the Last Year: Never true   Arboriculturist in the Last Year: Never true  Transportation Needs: No Transportation Needs   Lack of Transportation (Medical): No   Lack of Transportation (Non-Medical): No  Physical Activity: Sufficiently Active   Days of Exercise per Week: 3 days   Minutes of Exercise per Session: 60 min  Stress: No Stress Concern Present   Feeling of Stress : Not at all  Social Connections:  Unknown   Frequency of Communication with Friends and Family: More than three times a week   Frequency of Social Gatherings with Friends and Family: More than three times a week   Attends Religious Services: Not on Electrical engineer or Organizations: Not on file   Attends Archivist Meetings: Not on file   Marital Status: Not on file    Tobacco Counseling Counseling given: Not Answered Tobacco comments: former quit in 24s light smoker per pt    Clinical Intake:  Pre-visit preparation completed: Yes        Diabetes: No  How often do you need to have someone help you when you read instructions, pamphlets, or other written materials from your doctor or pharmacy?: 1 - Never    Interpreter Needed?: No      Activities of Daily Living In your present state of health, do you have any difficulty performing the following activities: 04/25/2021  Hearing? N  Vision? N  Difficulty concentrating or making decisions? N  Walking or climbing stairs? Y  Comment Unsteady gait. Cane in use.  Dressing or bathing? N  Doing errands, shopping? N  Preparing Food and eating ? N  Using the Toilet? N  In the past six months, have you accidently leaked urine? Y  Comment Managed with daily brief  Do you have problems with loss of bowel control? N  Managing your Medications? N  Managing your Finances? N  Housekeeping or managing your Housekeeping? Y  Comment Maid assist  Some recent data might be hidden    Patient Care Team: McLean-Scocuzza, Nino Glow, MD as PCP - General (Internal Medicine) Wellington Hampshire, MD as PCP - Cardiology (Cardiology)  Indicate any recent Medical Services you may have received from other than [facility] providers in the past year (date may be approximate).     Assessment:   This is a routine wellness examination for Taylor Mccarthy.  I connected with Taylor Mccarthy today by telephone and verified that I am speaking with the correct person using two  identifiers. Location patient: home Location provider: work Persons participating in the virtual visit: patient, Marine scientist.    I discussed the limitations, risks, security and privacy concerns of performing an evaluation and management service by telephone and the availability of in person appointments. The patient expressed understanding and verbally consented to this telephonic visit.    Interactive audio and video telecommunications were attempted between this provider and patient, however failed, due to patient having technical difficulties OR patient did not have access to video capability.  We continued and completed visit with audio only.  Some vital signs may  be absent or patient reported.   Hearing/Vision screen Hearing Screening - Comments:: Patient is able to hear conversational tones without difficulty.  No issues reported.  Vision Screening - Comments:: Wears corrective lenses when reading  Dietary issues and exercise activities discussed: Current Exercise Habits: Home exercise routine, Type of exercise: strength training/weights;stretching;calisthenics, Time (Minutes): 60, Frequency (Times/Week): 3, Weekly Exercise (Minutes/Week): 180, Intensity: Mild Healthy diet    Goals Addressed               This Visit's Progress     Patient Stated     Maintain Healthy Lifestyle (pt-stated)        Stay active with gym exercises Healthy diet       Depression Screen PHQ 2/9 Scores 04/25/2021 04/22/2020 09/11/2019 05/07/2019 04/21/2019 05/28/2018  PHQ - 2 Score 0 1 5 2  0 0  PHQ- 9 Score - - 6 5 - -    Fall Risk Fall Risk  04/25/2021 09/29/2020 04/22/2020 09/11/2019 05/07/2019  Falls in the past year? 1 1 0 0 0  Number falls in past yr: - 1 0 0 -  Injury with Fall? 0 1 - 0 -  Risk for fall due to : - Impaired balance/gait - History of fall(s) -  Follow up Falls evaluation completed Falls evaluation completed Falls evaluation completed - -    FALL RISK PREVENTION PERTAINING TO THE  HOME: Adequate lighting in your home to reduce risk of falls? Yes   ASSISTIVE DEVICES UTILIZED TO PREVENT FALLS: Life alert? No  Use of a cane, walker or w/c? Yes   TIMED UP AND GO: Was the test performed? No .   Cognitive Function:  Patient is alert and oriented x3.  Enjoys playing bridge and other brain stimulating activities.    6CIT Screen 04/21/2019  What Year? 0 points  What month? 0 points  What time? 0 points  Count back from 20 0 points  Months in reverse 0 points  Repeat phrase 0 points  Total Score 0    Immunizations Immunization History  Administered Date(s) Administered   DTaP 12/29/1996   Influenza Inj Mdck Quad Pf 05/17/2016   Influenza Split 10/30/2010, 05/23/2012   Influenza, High Dose Seasonal PF 04/26/2018   Influenza,inj,Quad PF,6+ Mos 05/03/2015   Influenza,inj,quad, With Preservative 04/26/2018, 04/24/2019   Influenza-Unspecified 03/29/2020   Moderna SARS-COV2 Booster Vaccination 06/17/2020   Moderna Sars-Covid-2 Vaccination 08/22/2019, 09/17/2019   Pneumococcal Conjugate-13 05/09/2015   Pneumococcal Polysaccharide-23 05/24/2017, 06/10/2019   Tdap 08/31/1985   Tetanus 08/31/1985   TDAP status: Due, Education has been provided regarding the importance of this vaccine. Advised may receive this vaccine at local pharmacy or Health Dept. Aware to provide a copy of the vaccination record if obtained from local pharmacy or Health Dept. Verbalized acceptance and understanding. Deferred.   Shingrix vaccine- Due, Education has been provided regarding the importance of this vaccine. Advised may receive this vaccine at local pharmacy or Health Dept. Aware to provide a copy of the vaccination record if obtained from local pharmacy or Health Dept. Verbalized acceptance and understanding. Deferred.   Health Maintenance Health Maintenance  Topic Date Due   COVID-19 Vaccine (4 - Booster for Moderna series) 05/11/2021 (Originally 09/09/2020)   Zoster Vaccines-  Shingrix (1 of 2) 07/25/2021 (Originally 09/18/1959)   INFLUENZA VACCINE  10/28/2021 (Originally 02/28/2021)   TETANUS/TDAP  04/25/2022 (Originally 09/01/1995)   DEXA SCAN  Completed   HPV VACCINES  Aged Out   Hepatitis C Screening: does not qualify  Vision Screening: Recommended annual ophthalmology exams for early detection of glaucoma and other disorders of the eye.  Dental Screening: Recommended annual dental exams for proper oral hygiene.  Community Resource Referral / Chronic Care Management: CRR required this visit?  No   CCM required this visit?  No      Plan:   Keep all routine maintenance appointments.   I have personally reviewed and noted the following in the patient's chart:   Medical and social history Use of alcohol, tobacco or illicit drugs  Current medications and supplements including opioid prescriptions. Not taking opioid.  Functional ability and status Nutritional status Physical activity Advanced directives List of other physicians Hospitalizations, surgeries, and ER visits in previous 12 months Vitals Screenings to include cognitive, depression, and falls Referrals and appointments  In addition, I have reviewed and discussed with patient certain preventive protocols, quality metrics, and best practice recommendations. A written personalized care plan for preventive services as well as general preventive health recommendations were provided to patient via mychart.     Varney Biles, LPN   11/11/2438

## 2021-04-29 ENCOUNTER — Other Ambulatory Visit: Payer: Medicare HMO

## 2021-05-25 ENCOUNTER — Other Ambulatory Visit: Payer: Self-pay

## 2021-05-25 ENCOUNTER — Ambulatory Visit (INDEPENDENT_AMBULATORY_CARE_PROVIDER_SITE_OTHER): Payer: Medicare HMO | Admitting: Internal Medicine

## 2021-05-25 ENCOUNTER — Encounter: Payer: Self-pay | Admitting: Internal Medicine

## 2021-05-25 VITALS — BP 130/84 | HR 77 | Temp 97.0°F | Ht 64.5 in | Wt 160.0 lb

## 2021-05-25 DIAGNOSIS — Z0000 Encounter for general adult medical examination without abnormal findings: Secondary | ICD-10-CM

## 2021-05-25 DIAGNOSIS — E559 Vitamin D deficiency, unspecified: Secondary | ICD-10-CM | POA: Diagnosis not present

## 2021-05-25 DIAGNOSIS — M8580 Other specified disorders of bone density and structure, unspecified site: Secondary | ICD-10-CM

## 2021-05-25 DIAGNOSIS — R32 Unspecified urinary incontinence: Secondary | ICD-10-CM

## 2021-05-25 DIAGNOSIS — Z23 Encounter for immunization: Secondary | ICD-10-CM

## 2021-05-25 DIAGNOSIS — I4891 Unspecified atrial fibrillation: Secondary | ICD-10-CM

## 2021-05-25 DIAGNOSIS — R319 Hematuria, unspecified: Secondary | ICD-10-CM | POA: Diagnosis not present

## 2021-05-25 DIAGNOSIS — R269 Unspecified abnormalities of gait and mobility: Secondary | ICD-10-CM

## 2021-05-25 DIAGNOSIS — N390 Urinary tract infection, site not specified: Secondary | ICD-10-CM

## 2021-05-25 DIAGNOSIS — Z1231 Encounter for screening mammogram for malignant neoplasm of breast: Secondary | ICD-10-CM

## 2021-05-25 DIAGNOSIS — Z13220 Encounter for screening for lipoid disorders: Secondary | ICD-10-CM

## 2021-05-25 DIAGNOSIS — R7989 Other specified abnormal findings of blood chemistry: Secondary | ICD-10-CM | POA: Diagnosis not present

## 2021-05-25 NOTE — Progress Notes (Signed)
F/u uti recurrent

## 2021-05-25 NOTE — Patient Instructions (Addendum)
Let me know if you want to do CT scan abdomen/pelvis Call to schedule mammogram   D. Sherlene Shams   [phone] Not available [place] [address]      Specialties     Obstetrics and Gynecology     +

## 2021-05-25 NOTE — Progress Notes (Signed)
Chief Complaint  Patient presents with   Urinary Tract Infection   Referral   F/u  1. Afib currently in afib on toprol 50 mg qd and xarelto 20 mg qd  2. Synthroid 75 mcg with h/o abnormal tfts will check today TSH  0.411 Low   Free T4   1.46 High  Thyroid Peroxidase Ab  25.64 High  3. C/o recurrent uti 2-3 x w/in the last 1 year tried macrobid 12/2020, keflex 500 mg 02/08/21 and augmentin 04/06/21 x 2 doses which caused nausea x 3 days and did not complete the dose she also has urinary incontinence  She has blood in the urine     Review of Systems  Constitutional:  Negative for weight loss.  HENT:  Negative for hearing loss.   Eyes:  Negative for blurred vision.  Respiratory:  Negative for shortness of breath.   Cardiovascular:  Negative for chest pain.  Gastrointestinal:  Negative for abdominal pain.  Genitourinary:  Positive for hematuria.  Musculoskeletal:        BL walks with cane  Skin:  Negative for rash.  Psychiatric/Behavioral:  Negative for memory loss.   Past Medical History:  Diagnosis Date   Atrial fibrillation, persistent ([place])    Cancer ([place])    colon cancer 1999 s/p resection    History of colon polyps    Insomnia    Low back pain    Mitral regurgitation    Pap smear abnormality of cervix with ASCUS favoring benign    Sciatica    Vitamin D deficiency    Past Surgical History:  Procedure Laterality Date   COLON RESECTION     1999 for cancer    TONSILLECTOMY     WISDOM TOOTH EXTRACTION     Family History  Problem Relation Age of Onset   Heart disease Father    Hyperlipidemia Father    Osteopenia Sister    Social History   Socioeconomic History   Marital status: Divorced    Spouse name: Not on file   Number of children: Not on file   Years of education: Not on file   Highest education level: Not on file  Occupational History   Not on file  Tobacco Use   Smoking status: Former   Smokeless tobacco: Never   Tobacco comments:    former  quit in 30s light smoker per pt   Vaping Use   Vaping Use: Never used  Substance and Sexual Activity   Alcohol use: Yes   Drug use: Not Currently   Sexual activity: Not Currently  Other Topics Concern   Not on file  Social History Narrative   Divorced    College grad    [place] from [place] to [place]   Former Office manager    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   Wears seat belts    Social Determinants of Radio broadcast assistant Strain: Low Risk    Difficulty of Paying Living Expenses: Not hard at all  Food Insecurity: No Food Insecurity   Worried About Charity fundraiser in the Last Year: Never true   Arboriculturist in the Last Year: Never true  Transportation Needs: No Transportation Needs   Lack of Transportation (Medical): No   Lack of Transportation (Non-Medical): No  Physical Activity: Sufficiently Active   Days of Exercise per Week: 3 days   Minutes of Exercise per Session: 60 min  Stress: No Stress Concern Present   Feeling of Stress :  Not at all  Social Connections: Unknown   Frequency of Communication with Friends and Family: More than three times a week   Frequency of Social Gatherings with Friends and Family: More than three times a week   Attends Religious Services: Not on Electrical engineer or Organizations: Not on file   Attends Archivist Meetings: Not on file   Marital Status: Not on file  Intimate Partner Violence: Not At Risk   Fear of Current or Ex-Partner: No   Emotionally Abused: No   Physically Abused: No   Sexually Abused: No   Current Meds  Medication Sig   Calcium Carbonate (CALCIUM 600 PO) Take 600 mg by mouth in the morning and at bedtime.   metoprolol succinate (TOPROL-XL) 50 MG 24 hr tablet TAKE 1 TABLET BY MOUTH ONCE DAILY. NEEDS OFFICE VISIT FOR FURTHER REFILLS.   Multiple Vitamin (MULTIVITAMIN) tablet Take 1 tablet by mouth daily.   sertraline (ZOLOFT) 50 MG tablet TAKE 1 TABLET EVERY DAY IN THE MORNING    SYNTHROID 75 MCG tablet TAKE 1 TAB (75 MCG) DAILY BEFORE BREAKFAST. NO FURTHER REFILLS NEEDS APPT IN OFFICE/CALL FOR APPOINTMENT ASAP   XARELTO 20 MG TABS tablet TAKE 1 TABLET EVERY DAY WITH SUPPER   Allergies  Allergen Reactions   Morpholine Salicylate    Augmentin [Amoxicillin-Pot Clavulanate]     Nausea x 3 days   Codeine     Nausea     Other     Tree pollen     No results found for this or any previous visit (from the past 2160 hour(s)). Objective  Body mass index is 27.04 kg/m. Wt Readings from Last 3 Encounters:  05/25/21 160 lb (72.6 kg)  04/25/21 168 lb (76.2 kg)  12/15/20 168 lb (76.2 kg)   Temp Readings from Last 3 Encounters:  05/25/21 (!) 97 F (36.1 C) (Temporal)  09/29/20 98.3 F (36.8 C) (Oral)  02/27/19 (!) 97.4 F (36.3 C)   BP Readings from Last 3 Encounters:  05/25/21 130/84  12/15/20 (!) 146/96  09/29/20 116/78   Pulse Readings from Last 3 Encounters:  05/25/21 77  12/15/20 82  09/29/20 100    Physical Exam Vitals and nursing note reviewed.  Constitutional:      Appearance: Normal appearance. She is well-developed and well-groomed.  HENT:     Head: Normocephalic and atraumatic.  Eyes:     Conjunctiva/sclera: Conjunctivae normal.     Pupils: Pupils are equal, round, and reactive to light.  Cardiovascular:     Rate and Rhythm: Normal rate. Rhythm irregular.     Heart sounds: Normal heart sounds.     Comments: Atrial fib Skin:    General: Skin is warm and dry.  Neurological:     General: No focal deficit present.     Mental Status: She is alert and oriented to person, place, and time. Mental status is at baseline.     Gait: Gait normal.  Psychiatric:        Attention and Perception: Attention and perception normal.        Mood and Affect: Mood and affect normal.        Speech: Speech normal.        Behavior: Behavior normal. Behavior is cooperative.        Thought Content: Thought content normal.        Cognition and Memory:  Cognition and memory normal.        Judgment: Judgment  normal.    Assessment  Plan   Recurrent uti/incontinence refer Dr. Simon Rhein had macrobid, keflex, augmentin since 12/2020 still with hematuria needs further w/u  Abnormal tfts-check labs consider endocrine f/u  Afib persistent f/u leb cards on xarelto 20 and toprol xl 50 mg qd   HM Flu shot had utd pna 23 vaccine had 05/22/17 and 06/10/19 had itching after vaccine and felt ike had flu prevnar 05/09/15 moderna 4/4 Consider shingrix (declines) and Tdap (mailed Rx  and sent again pharmacy 05/25/21) Declines MMR check    Pap last 2017 Ob/GYN Dr. Olean Ree [facility] [place] [place] h/o ASCUS pap with h/o HPV + per prior PCP notes  -per notes pap 05/17/16 neg pap +atrophy neg HPV  -HPV + neg pap 09/02/12 -ASCUS pap 02/12/12 +HPV  -out of age window pap currently    mammo 11/08/17 [facility] normal  -ordered GI Breast center 12/04/19 negative  dexa GI breast center 12/04/19 +osteopenia rec calcium and vitamin D     Coloscopy q3 years last in 2015 or 2016 h/o colon cancer s/p resection 1999 due to f/u per pt consider to f/u with Dr. Jearld Pies in [place] declined colonoscopy due to age and Afib disc consider CT ab/pelvis in the future h/o colon cancer    Former smoker quit in 24s light smoker    Skin-established dermatology   Provider: Dr. Olivia Mackie McLean-Scocuzza-Internal Medicine

## 2021-05-26 LAB — URINALYSIS
Bilirubin Urine: NEGATIVE
Glucose, UA: NEGATIVE
Ketones, ur: NEGATIVE
Nitrite: POSITIVE — AB
Protein, ur: NEGATIVE
Specific Gravity, Urine: 1.019 (ref 1.001–1.035)
pH: 5.5 (ref 5.0–8.0)

## 2021-05-26 LAB — CBC WITH DIFFERENTIAL/PLATELET
Basophils Absolute: 0.1 10*3/uL (ref 0.0–0.1)
Basophils Relative: 1.1 % (ref 0.0–3.0)
Eosinophils Absolute: 0.1 10*3/uL (ref 0.0–0.7)
Eosinophils Relative: 1.7 % (ref 0.0–5.0)
HCT: 42.9 % (ref 36.0–46.0)
Hemoglobin: 14.4 g/dL (ref 12.0–15.0)
Lymphocytes Relative: 23.9 % (ref 12.0–46.0)
Lymphs Abs: 1.8 10*3/uL (ref 0.7–4.0)
MCHC: 33.5 g/dL (ref 30.0–36.0)
MCV: 107.9 fl — ABNORMAL HIGH (ref 78.0–100.0)
Monocytes Absolute: 0.5 10*3/uL (ref 0.1–1.0)
Monocytes Relative: 6.3 % (ref 3.0–12.0)
Neutro Abs: 5 10*3/uL (ref 1.4–7.7)
Neutrophils Relative %: 67 % (ref 43.0–77.0)
Platelets: 272 10*3/uL (ref 150.0–400.0)
RBC: 3.98 Mil/uL (ref 3.87–5.11)
RDW: 12.8 % (ref 11.5–15.5)
WBC: 7.5 10*3/uL (ref 4.0–10.5)

## 2021-05-26 LAB — LIPID PANEL
Cholesterol: 134 mg/dL (ref 0–200)
HDL: 49.8 mg/dL (ref >39.00)
LDL Cholesterol: 64 mg/dL (ref 0–99)
NonHDL: 84.22
Total CHOL/HDL Ratio: 3
Triglycerides: 99 mg/dL (ref 0.0–149.0)
VLDL: 19.8 mg/dL (ref 0.0–40.0)

## 2021-05-26 LAB — COMPREHENSIVE METABOLIC PANEL
ALT: 16 U/L (ref 0–35)
AST: 17 U/L (ref 0–37)
Albumin: 4.1 g/dL (ref 3.5–5.2)
Alkaline Phosphatase: 83 U/L (ref 39–117)
BUN: 13 mg/dL (ref 6–23)
CO2: 27 mEq/L (ref 19–32)
Calcium: 9.5 mg/dL (ref 8.4–10.5)
Chloride: 103 mEq/L (ref 96–112)
Creatinine, Ser: 0.5 mg/dL (ref 0.40–1.20)
GFR: 87.18 mL/min (ref >60.00)
Glucose, Bld: 93 mg/dL (ref 70–99)
Potassium: 3.7 mEq/L (ref 3.5–5.1)
Sodium: 138 mEq/L (ref 135–145)
Total Bilirubin: 0.6 mg/dL (ref 0.2–1.2)
Total Protein: 7.1 g/dL (ref 6.0–8.3)

## 2021-05-26 LAB — TSH: TSH: 4.85 u[IU]/mL (ref 0.35–5.50)

## 2021-05-26 LAB — VITAMIN D 25 HYDROXY (VIT D DEFICIENCY, FRACTURES): VITD: 44.27 ng/mL (ref 30.00–100.00)

## 2021-05-26 LAB — URINE CULTURE
MICRO NUMBER:: 12554445
SPECIMEN QUALITY:: ADEQUATE

## 2021-05-26 LAB — IRON,TIBC AND FERRITIN PANEL
%SAT: 18 % (calc) (ref 16–45)
Ferritin: 233 ng/mL (ref 16–288)
Iron: 45 ug/dL (ref 45–160)
TIBC: 256 mcg/dL (calc) (ref 250–450)

## 2021-05-26 LAB — THYROID PEROXIDASE ANTIBODY: Thyroperoxidase Ab SerPl-aCnc: 2 IU/mL (ref <9)

## 2021-05-26 LAB — MAGNESIUM: Magnesium: 1.8 mg/dL (ref 1.5–2.5)

## 2021-05-26 LAB — T4, FREE: Free T4: 0.9 ng/dL (ref 0.60–1.60)

## 2021-05-30 ENCOUNTER — Ambulatory Visit: Payer: Medicare HMO | Admitting: Internal Medicine

## 2021-05-31 ENCOUNTER — Telehealth: Payer: Self-pay

## 2021-05-31 NOTE — Telephone Encounter (Signed)
-----   Message from Delorise Jackson, MD sent at 05/30/2021  5:50 AM EDT ----- No UTI noted on culture  I still want her to f/u with urogynecology referral in

## 2021-06-01 NOTE — Telephone Encounter (Signed)
Pt returning call for test results. Callback number is [phone]

## 2021-06-02 ENCOUNTER — Other Ambulatory Visit: Payer: Self-pay

## 2021-06-02 ENCOUNTER — Ambulatory Visit (INDEPENDENT_AMBULATORY_CARE_PROVIDER_SITE_OTHER): Payer: Medicare HMO

## 2021-06-02 DIAGNOSIS — R42 Dizziness and giddiness: Secondary | ICD-10-CM | POA: Diagnosis not present

## 2021-06-02 DIAGNOSIS — R0609 Other forms of dyspnea: Secondary | ICD-10-CM

## 2021-06-02 DIAGNOSIS — I5023 Acute on chronic systolic (congestive) heart failure: Secondary | ICD-10-CM | POA: Diagnosis not present

## 2021-06-02 LAB — ECHOCARDIOGRAM COMPLETE
AR max vel: 1.91 cm2
AV Area VTI: 1.91 cm2
AV Area mean vel: 1.64 cm2
AV Mean grad: 3 mmHg
AV Peak grad: 5.4 mmHg
Ao pk vel: 1.16 m/s
Area-P 1/2: 3.72 cm2
Calc EF: 65 %
MV M vel: 5.51 m/s
MV Peak grad: 121.6 mmHg
P 1/2 time: 733 msec
Radius: 0.6 cm
S' Lateral: 2.8 cm
Single Plane A2C EF: 60.1 %
Single Plane A4C EF: 64.9 %

## 2021-06-08 ENCOUNTER — Telehealth: Payer: Self-pay | Admitting: Nurse Practitioner

## 2021-06-08 NOTE — Telephone Encounter (Signed)
I spoke with the patient regarding her echo results and Ignacia Bayley, NP's recommendations to be seen in the office at this time for an in person evaluation of her volume status.  The patient laughed and advised "I appreciate your efforts, you are doing a really good job, but I am having no symptoms and feel that you all are just trying to pad your pockets with my insurance company."   I have advised the patient that echo findings do suggest possible fluid retention and in person examination is warranted to assess the findings on her test, however, I cannot force her to make an appointment at this time.  She advised she lives an hour away and "hates coming to this place because it is so big."  She is agreeable with an in person evaluation in January.   I have scheduled her to see Dr. Fletcher Anon on Friday 08/12/21 at 11:00 am, but have encouraged her to please call the office sooner for an appointment if she develops increased SOB or visible edema.  The patient voices understanding and is agreeable.  To Ignacia Bayley, NP as a Juluis Rainier.

## 2021-06-08 NOTE — Telephone Encounter (Signed)
Theora Gianotti, NP  06/07/2021  5:47 PM EST     Low normal heart squeezing function which is slightly improved compared to prior study.  The heart is moderately stiff.  The pressures on the right side of the heart are moderately elevated.  The top chambers of both very dilated which is common in someone with atrial fibrillation. The mitral valve is moderately to severely leaky while the tricuspid valve is severely leaky. With the exception of the higher pressures on the right side of the heart, the study is similar to last years.  Given elevation of right-sided pressures, she should have office follow-up sooner rather than later for evaluation of volume status.  I do not see any pending appointments on the books.

## 2021-06-17 ENCOUNTER — Telehealth: Payer: Self-pay | Admitting: Internal Medicine

## 2021-06-17 ENCOUNTER — Other Ambulatory Visit: Payer: Self-pay

## 2021-06-17 MED ORDER — METOPROLOL SUCCINATE ER 50 MG PO TB24: ORAL_TABLET | ORAL | 2 refills | Status: DC

## 2021-06-17 NOTE — Telephone Encounter (Signed)
Pt called in requesting medication (oxybutynin 10mg ) for restless bladder. Pt stated that her friends are on the medication and they are doing great. Pt stated she would like to try medication. Pt requesting callback

## 2021-06-17 NOTE — Telephone Encounter (Signed)
She can call daily for cancellations

## 2021-06-17 NOTE — Telephone Encounter (Signed)
Patient referred to Urology but can not see them until January. States that she hardly gets any sleep as she is up and down all night with urinary frequency.   Please advise

## 2021-06-21 NOTE — Telephone Encounter (Signed)
Does she want to try trospium ?for overactive bladder  But with history of recurrent UTI needs to see urology as well this can be complex to control both

## 2021-06-21 NOTE — Telephone Encounter (Signed)
Left message to return call 

## 2021-06-21 NOTE — Telephone Encounter (Signed)
Patient informed and verbalized understanding.   Patient states she will call to see if she can get on a cancellation list but this does not help her right now. States that she guesses she will continue to urinate on herself at night if there are no medications Dr Olivia Mackie McLean-Scocuzza recommends to hold her over until her January appointment.

## 2021-06-22 NOTE — Telephone Encounter (Signed)
Patient will keep her appointment,   She is agreeable to medication. Please send to [facility].  Patient says thank you as she had an accident trying to rush to the bathroom in the middle of the night. She fell and broke a few ribs. Patient says thank you for the medication

## 2021-06-27 ENCOUNTER — Encounter: Payer: Self-pay | Admitting: Internal Medicine

## 2021-06-27 ENCOUNTER — Other Ambulatory Visit: Payer: Self-pay | Admitting: Internal Medicine

## 2021-06-27 DIAGNOSIS — N3281 Overactive bladder: Secondary | ICD-10-CM

## 2021-06-27 MED ORDER — TROSPIUM CHLORIDE 20 MG PO TABS: 20.0000 mg | ORAL_TABLET | Freq: Two times a day (BID) | ORAL | 11 refills | Status: DC

## 2021-06-27 NOTE — Telephone Encounter (Signed)
Sent  Dr. Olivia Mackie McLean-Scocuzza

## 2021-07-01 ENCOUNTER — Ambulatory Visit (INDEPENDENT_AMBULATORY_CARE_PROVIDER_SITE_OTHER): Payer: Medicare HMO | Admitting: Obstetrics and Gynecology

## 2021-07-01 ENCOUNTER — Other Ambulatory Visit: Payer: Self-pay

## 2021-07-01 ENCOUNTER — Encounter: Payer: Self-pay | Admitting: Obstetrics and Gynecology

## 2021-07-01 VITALS — BP 136/87 | HR 85 | Ht 66.0 in | Wt 160.0 lb

## 2021-07-01 DIAGNOSIS — N390 Urinary tract infection, site not specified: Secondary | ICD-10-CM

## 2021-07-01 DIAGNOSIS — N952 Postmenopausal atrophic vaginitis: Secondary | ICD-10-CM | POA: Diagnosis not present

## 2021-07-01 DIAGNOSIS — N3281 Overactive bladder: Secondary | ICD-10-CM

## 2021-07-01 MED ORDER — ESTRADIOL 0.1 MG/GM VA CREA: 0.5000 g | TOPICAL_CREAM | VAGINAL | 11 refills | Status: DC

## 2021-07-01 NOTE — Patient Instructions (Addendum)
Today we talked about ways to manage bladder urgency such as altering your diet to avoid irritative beverages and foods (bladder diet) as well as attempting to decrease stress and other exacerbating factors.    The Most Bothersome Foods* The Least Bothersome Foods*  Coffee - Regular & Decaf Tea - caffeinated Carbonated beverages - cola, non-colas, diet & caffeine-free Alcohols - Beer, Red Wine, White Wine, Champagne Fruits - Grapefruit, [place], Orange, [employer] - Cranberry, Grapefruit, Orange, Pineapple Vegetables - Tomato & Tomato Products Flavor Enhancers - Hot peppers, Spicy foods, Chili, Horseradish, Vinegar, Monosodium glutamate (MSG) Artificial Sweeteners - NutraSweet, Sweet 'N Low, Equal (sweetener), Saccharin Ethnic foods - [place], [place], [place] food [employer]s - low-fat & whole Fruits - Bananas, Blueberries, Honeydew melon, Pears, Raisins, Watermelon Vegetables - Broccoli, Brussels Sprouts, [place], Carrots, Cauliflower, [place], Cucumber, Mushrooms, Peas, Radishes, Squash, Zucchini, White potatoes, Sweet potatoes & yams Poultry - Chicken, Eggs, [place], [employer] - Beef, Programmer, multimedia, Lamb Seafood - Shrimp, [place] fish, Salmon Grains - Oat, Rice Snacks - Pretzels, Popcorn  *Lissa Morales et al. Diet and its role in interstitial cystitis/bladder pain syndrome (IC/BPS) and comorbid conditions. [place] 2012 Jan 11.   Start vaginal estrogen therapy (estrace) nightly for two weeks then 2 times weekly at night for treatment of vaginal atrophy (dryness of the vaginal tissues).  Please let us know if the prescription is too expensive and we can look for alternative options.   Over the counter, you can take D-mannose or cranberry tablets.
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
## 2021-07-07 MED ORDER — GEMTESA 75 MG PO TABS: 1.0000 | ORAL_TABLET | Freq: Every day | ORAL | 3 refills | Status: DC

## 2021-07-07 NOTE — Addendum Note (Signed)
Addended by: Orland Mustard on: 07/07/2021 01:16 PM   Modules accepted: Orders

## 2021-08-12 ENCOUNTER — Other Ambulatory Visit: Payer: Self-pay

## 2021-08-12 ENCOUNTER — Ambulatory Visit (INDEPENDENT_AMBULATORY_CARE_PROVIDER_SITE_OTHER): Payer: Medicare HMO | Admitting: Physician Assistant

## 2021-08-12 ENCOUNTER — Encounter: Payer: Self-pay | Admitting: Physician Assistant

## 2021-08-12 VITALS — BP 100/60 | HR 75 | Ht 66.0 in | Wt 160.0 lb

## 2021-08-12 DIAGNOSIS — I071 Rheumatic tricuspid insufficiency: Secondary | ICD-10-CM | POA: Diagnosis not present

## 2021-08-12 DIAGNOSIS — I5032 Chronic diastolic (congestive) heart failure: Secondary | ICD-10-CM | POA: Diagnosis not present

## 2021-08-12 DIAGNOSIS — I340 Nonrheumatic mitral (valve) insufficiency: Secondary | ICD-10-CM | POA: Diagnosis not present

## 2021-08-12 DIAGNOSIS — I4821 Permanent atrial fibrillation: Secondary | ICD-10-CM | POA: Diagnosis not present

## 2021-08-12 DIAGNOSIS — I872 Venous insufficiency (chronic) (peripheral): Secondary | ICD-10-CM | POA: Diagnosis not present

## 2021-08-12 NOTE — Progress Notes (Signed)
Cardiology Office Note   Date:  08/12/2021   ID:  Taylor Mccarthy, DOB [birth year], MRN [id number]  PCP:  McLean-Scocuzza, Nino Glow, MD  Cardiologist:   Kathlyn Sacramento, MD   No chief complaint on file.     History of Present Illness: Taylor Mccarthy is a 81 y.o. female who is here today for follow-up visit regarding permanent atrial fibrillation and moderate mitral and tricuspid regurgitation.    She has known history of hypothyroidism and permanent atrial fibrillation on long-term anticoagulation with Xarelto.  She has been followed in the past by Dr. Wallace Cullens.  She had echocardiogram done in December 2018 which with an EF of 55 to 60%, mild LVH, severely dilated left atrium, severely dilated right atrium, moderate to severe mitral regurgitation and severe tricuspid regurgitation.  Estimated pulmonary artery pressure was between 37 to 50 mmHg. She has also history of colon cancer.  Atrial fibrillation is being treated with rate control anticoagulation.  She has been doing reasonably well with no recent chest pain, worsening dyspnea or palpitations.  No side effects with anticoagulation.  She had an echocardiogram done today which was personally reviewed by me and showed normal LV systolic function, severe biatrial enlargement and stable moderate mitral and tricuspid regurgitation with only mild pulmonary hypertension. She reports dark discoloration of both feet but has no claudication.  Past Medical History:  Diagnosis Date   Atrial fibrillation, persistent ([place])    Cancer ([place])    colon cancer 1999 s/p resection    History of colon polyps    Insomnia    Low back pain    Mitral regurgitation    Pap smear abnormality of cervix with ASCUS favoring benign    Sciatica    Vitamin D deficiency     Past Surgical History:  Procedure Laterality Date   COLON RESECTION     1999 for cancer    TONSILLECTOMY     WISDOM TOOTH EXTRACTION       Current Outpatient Medications  Medication  Sig Dispense Refill   Calcium Carbonate (CALCIUM 600 PO) Take 600 mg by mouth in the morning and at bedtime.     estradiol (ESTRACE) 0.1 MG/GM vaginal cream Place 0.5 g vaginally 2 (two) times a week. Place 0.5g nightly for two weeks then twice a week after 30 g 11   metoprolol succinate (TOPROL-XL) 50 MG 24 hr tablet TAKE 1 TABLET BY MOUTH ONCE DAILY. NEEDS OFFICE VISIT FOR FURTHER REFILLS. 90 tablet 2   Multiple Vitamin (MULTIVITAMIN) tablet Take 1 tablet by mouth daily.     sertraline (ZOLOFT) 50 MG tablet TAKE 1 TABLET EVERY DAY IN THE MORNING 90 tablet 3   SYNTHROID 75 MCG tablet TAKE 1 TAB (75 MCG) DAILY BEFORE BREAKFAST. NO FURTHER REFILLS NEEDS APPT IN OFFICE/CALL FOR APPOINTMENT ASAP 90 tablet 3   Vibegron (GEMTESA) 75 MG TABS Take 1 tablet by mouth daily. 90 tablet 3   XARELTO 20 MG TABS tablet TAKE 1 TABLET EVERY DAY WITH SUPPER 90 tablet 1   No current facility-administered medications for this visit.    Allergies:   Morpholine salicylate, Augmentin [amoxicillin-pot clavulanate], Codeine, and Other    Social History:  The patient  reports that she has quit smoking. She has never used smokeless tobacco. She reports current alcohol use of about 14.0 standard drinks per week. She reports that she does not currently use drugs.   Family History:  The patient's family history includes Heart disease in her father;  Hyperlipidemia in her father; Osteopenia in her sister.    ROS:  Please see the history of present illness.   Otherwise, review of systems are positive for none.   All other systems are reviewed and negative.    PHYSICAL EXAM: VS:  There were no vitals taken for this visit. , BMI There is no height or weight on file to calculate BMI. GEN: Well nourished, well developed, in no acute distress  HEENT: normal  Neck: no JVD, carotid bruits, or masses Cardiac: Irregularly irregular; no murmurs, rubs, or gallops,no edema  Respiratory:  clear to auscultation bilaterally, normal  work of breathing GI: soft, nontender, nondistended, + BS MS: no deformity or atrophy  Skin: warm and dry, no rash Neuro:  Strength and sensation are intact Psych: euthymic mood, full affect Some cyanosis in both feet but posterior tibial pulses palpable bilaterally.  EKG:  EKG is ordered today. The ekg ordered today demonstrates atrial fibrillation with ventricular rate of 82 bpm.   Recent Labs: 05/25/2021: ALT 16; BUN 13; Creatinine, Ser 0.50; Hemoglobin 14.4; Magnesium 1.8; Platelets 272.0; Potassium 3.7; Sodium 138; TSH 4.85    Lipid Panel    Component Value Date/Time   CHOL 134 05/25/2021 1633   CHOL 198 06/03/2018 1026   TRIG 99.0 05/25/2021 1633   HDL 49.80 05/25/2021 1633   HDL 75 06/03/2018 1026   CHOLHDL 3 05/25/2021 1633   VLDL 19.8 05/25/2021 1633   LDLCALC 64 05/25/2021 1633   LDLCALC 98 06/03/2018 1026      Wt Readings from Last 3 Encounters:  07/01/21 160 lb (72.6 kg)  05/25/21 160 lb (72.6 kg)  04/25/21 168 lb (76.2 kg)       PAD Screen 02/27/2019  Previous PAD dx? No  Previous surgical procedure? No  Pain with walking? No  Feet/toe relief with dangling? No  Painful, non-healing ulcers? No  Extremities discolored? No      ASSESSMENT AND PLAN:  1.  Permanent atrial fibrillation: Ventricular rate is more controlled since increasing the dose of Toprol to 50 mg once daily.  She is tolerating anticoagulation.   2.  Mitral regurgitation and tricuspid regurgitation: Repeat echocardiogram today showed stable moderate mitral and tricuspid regurgitation.  3.  Chronic venous insufficiency: Bluish discoloration of both feet is likely due to this.  She has no claudication and she has palpable distal pulses.  I asked her to start wearing knee-high support stockings low pressure during the day.    Disposition:   FU with me in 6 months  Signed,  Kathlyn Sacramento, MD  08/12/2021 10:16 AM    [place]

## 2021-08-12 NOTE — Patient Instructions (Signed)
Medication Instructions:  No changes at this time.  *If you need a refill on your cardiac medications before your next appointment, please call your pharmacy*   Lab Work: None  If you have labs (blood work) drawn today and your tests are completely normal, you will receive your results only by: [place] (if you have MyChart) OR A paper copy in the mail If you have any lab test that is abnormal or we need to change your treatment, we will call you to review the results.   Testing/Procedures: None   Follow-Up: At [facility], you and your health needs are our priority.  As part of our continuing mission to provide you with exceptional heart care, we have created designated Provider Care Teams.  These Care Teams include your primary Cardiologist (physician) and Advanced Practice Providers (APPs -  Physician Assistants and Nurse Practitioners) who all work together to provide you with the care you need, when you need it.   Your next appointment:   6 month(s)  The format for your next appointment:   In Person  Provider:   Kathlyn Sacramento, MD or Christell Faith, PA-C

## 2021-08-12 NOTE — Progress Notes (Signed)
Cardiology Office Note    Date:  08/12/2021   ID:  Taylor Mccarthy, DOB 11-28-1940, MRN [id number]  PCP:  McLean-Scocuzza, Nino Glow, MD  Cardiologist:  Kathlyn Sacramento, MD  Electrophysiologist:  None   Chief Complaint: Follow-up  History of Present Illness:   Taylor Mccarthy is a 81 y.o. female with history of permanent A. fib on Xarelto, HFpEF, moderate mitral and tricuspid regurgitation, frequent PVCs, hypothyroidism colon cancer, and chronic dizziness who presents for follow-up of her A. fib.  She was previously followed by Dr. Delman Cheadle. Echo in 06/2017 showed and EF of 55-60%, mild LVH, severe biatrial enlargement, moderate to severe mitral regurgitation, severe tricuspid regurgitation, and PASP 37-50 mmHg. She has reported undergoing DCCV twice in the past, which were not successful leading to rate control strategy. She was seen in the office in 01/2019 noting dizziness and lightheadedness. She was noted to have frequent PVCs on 12-lead EKG at that visit. Her Toprol was titrated to 50 mg daily. Echo showed an EF of 55-60%, mild LVH, normal RVSF and cavity size, PASP 39.2 mmHg, severe biatrial enlargement, moderate to severe mitral regurgitation, moderate to severe tricuspid regurgitation. Overall, this was a stable study when compared to 2018.  Echo in 04/2020 showed an EF of 45 to 50%, global hypokinesis, low normal RV systolic function with severely enlarged RV cavity size and normal PASP, severe biatrial enlargement, moderate to severe mitral regurgitation with moderate holosystolic prolapse of both leaflets of the mitral valve, severe tricuspid regurgitation, and an estimated right atrial pressure of 3 mmHg.  This study was personally reviewed by her primary cardiologist and felt to demonstrate normal LV systolic function and a stable moderate mitral and tricuspid regurgitation with only mild pulmonary hypertension.  She was last seen in the office in 11/2020 noting bilateral lower extremity  paresthesias as well as a cold lower extremities.  Most recent echo from 06/02/2021 demonstrated a low normal LV systolic function of 50 to 55%, no regional wall motion abnormalities, mild LVH, normal RV systolic function and ventricular cavity size, moderately elevated PASP estimated at 46.8 mmHg, severe biatrial enlargement, moderate to severe mitral regurgitation with moderate late systolic prolapse of both leaflets of the mitral valve, severe tricuspid regurgitation, and an estimated right atrial pressure of 15 mmHg.  Given concern for possible volume overload, appointment was scheduled for today.  She comes in doing very well from a cardiac perspective.  No chest pain, dyspnea, palpitations, dizziness, presyncope, or syncope.  No lower extremity swelling, early satiety, abdominal distention, or worsening orthopnea.  She does continue to note bilateral lower extremity paresthesias, which she attributes to peripheral neuropathy, as well as a cool lower extremities.  Not currently wearing compression stockings.  She did suffer a mechanical fall in November, while sleepwalking.  She tripped in her bathroom landing across the edge of her bathtub.  She did not hit her head or suffer LOC.  She did not seek medical evaluation following this event.  No falls since.  No hematochezia or melena.  She remains very active at baseline.  She does not have any active issues or concerns at this time.   Labs independently reviewed: 04/2021 - magnesium 1.8, TC 134, TG 99, HDL 49, LDL 64, TSH normal, HGB 14.4, PLT 272, potassium 3.7, BUN 13, SCr 0.50, albumin 4.1, AST/ALT normal  Past Medical History:  Diagnosis Date   Atrial fibrillation, persistent ([place])    Cancer ([place])    colon cancer 1999 s/p resection  History of colon polyps    Insomnia    Low back pain    Mitral regurgitation    Pap smear abnormality of cervix with ASCUS favoring benign    Sciatica    Vitamin D deficiency     Past Surgical History:   Procedure Laterality Date   COLON RESECTION     1999 for cancer    TONSILLECTOMY     WISDOM TOOTH EXTRACTION      Current Medications: Current Meds  Medication Sig   Calcium Carbonate (CALCIUM 600 PO) Take 600 mg by mouth daily.   estradiol (ESTRACE) 0.1 MG/GM vaginal cream Place 0.5 g vaginally 2 (two) times a week. Place 0.5g nightly for two weeks then twice a week after   metoprolol succinate (TOPROL-XL) 50 MG 24 hr tablet TAKE 1 TABLET BY MOUTH ONCE DAILY. NEEDS OFFICE VISIT FOR FURTHER REFILLS.   Multiple Vitamin (MULTIVITAMIN) tablet Take 1 tablet by mouth daily.   sertraline (ZOLOFT) 50 MG tablet TAKE 1 TABLET EVERY DAY IN THE MORNING   SYNTHROID 75 MCG tablet TAKE 1 TAB (75 MCG) DAILY BEFORE BREAKFAST. NO FURTHER REFILLS NEEDS APPT IN OFFICE/CALL FOR APPOINTMENT ASAP   XARELTO 20 MG TABS tablet TAKE 1 TABLET EVERY DAY WITH SUPPER    Allergies:   Morpholine salicylate, Augmentin [amoxicillin-pot clavulanate], Codeine, and Other   Social History   Socioeconomic History   Marital status: Divorced    Spouse name: Not on file   Number of children: Not on file   Years of education: Not on file   Highest education level: Not on file  Occupational History   Not on file  Tobacco Use   Smoking status: Former   Smokeless tobacco: Never   Tobacco comments:    former quit in 13s light smoker per pt   Vaping Use   Vaping Use: Never used  Substance and Sexual Activity   Alcohol use: Yes    Alcohol/week: 14.0 standard drinks    Types: 14 Glasses of wine per week    Comment: 2 glasses at night   Drug use: Not Currently   Sexual activity: Not Currently  Other Topics Concern   Not on file  Social History Narrative   Divorced    College grad    Moved from [place] to [place]   Former Office manager    No guns   Wears seat belts    Social Determinants of Radio broadcast assistant Strain: Low Risk    Difficulty of Paying Living Expenses: Not  hard at all  Food Insecurity: No Food Insecurity   Worried About Charity fundraiser in the Last Year: Never true   Arboriculturist in the Last Year: Never true  Transportation Needs: No Transportation Needs   Lack of Transportation (Medical): No   Lack of Transportation (Non-Medical): No  Physical Activity: Sufficiently Active   Days of Exercise per Week: 3 days   Minutes of Exercise per Session: 60 min  Stress: No Stress Concern Present   Feeling of Stress : Not at all  Social Connections: Unknown   Frequency of Communication with Friends and Family: More than three times a week   Frequency of Social Gatherings with Friends and Family: More than three times a week   Attends Religious Services: Not on file   Active Member of Clubs or Organizations: Not on file   Attends Archivist Meetings: Not on file   Marital Status: Not  on file     Family History:  The patient's family history includes Heart disease in her father; Hyperlipidemia in her father; Osteopenia in her sister.  ROS:   Review of Systems  Constitutional:  Negative for chills, diaphoresis, fever, malaise/fatigue and weight loss.  HENT:  Negative for congestion.   Eyes:  Negative for discharge and redness.  Respiratory:  Negative for cough, sputum production, shortness of breath and wheezing.   Cardiovascular:  Negative for chest pain, palpitations, orthopnea, claudication, leg swelling and PND.  Gastrointestinal:  Negative for abdominal pain, heartburn, nausea and vomiting.  Musculoskeletal:  Positive for falls. Negative for myalgias.  Skin:  Negative for rash.  Neurological:  Negative for dizziness, tingling, tremors, sensory change, speech change, focal weakness, loss of consciousness and weakness.  Endo/Heme/Allergies:  Does not bruise/bleed easily.  Psychiatric/Behavioral:  Negative for substance abuse. The patient is not nervous/anxious.   All other systems reviewed and are negative.   EKGs/Labs/Other  Studies Reviewed:    Studies reviewed were summarized above. The additional studies were reviewed today:  2D echo 06/02/2021: 1. Left ventricular ejection fraction, by estimation, is 50 to 55%. The  left ventricle has low normal function. The left ventricle has no regional  wall motion abnormalities. There is mild left ventricular hypertrophy.  Left ventricular diastolic  parameters are consistent with Grade II diastolic dysfunction  (pseudonormalization).   2. Right ventricular systolic function is normal. The right ventricular  size is normal. There is moderately elevated pulmonary artery systolic  pressure. The estimated right ventricular systolic pressure is 44.0 mmHg.   3. Left atrial size was severely dilated.   4. Right atrial size was severely dilated.   5. The mitral valve is normal in structure. Moderate to severe mitral  valve regurgitation. No evidence of mitral stenosis. There is moderate  late systolic prolapse of both leaflets of the mitral valve.   6. Tricuspid valve regurgitation is severe.   7. The aortic valve is normal in structure. Aortic valve regurgitation is  not visualized. No aortic stenosis is present.   8. The inferior vena cava is dilated in size with <50% respiratory  variability, suggesting right atrial pressure of 15 mmHg.   Comparison(s): LVEF 45-50%, Mod-Sev MR, MVP, Sev TR. __________  2D echo 05/18/2020: 1. Left ventricular ejection fraction, by estimation, is 45 to 50%. The  left ventricle has mildly decreased function. The left ventricle  demonstrates global hypokinesis. There is mild left ventricular  hypertrophy. Left ventricular diastolic parameters  are indeterminate.   2. Right ventricular systolic function is low normal. The right  ventricular size is severely enlarged. There is normal pulmonary artery  systolic pressure.   3. Left atrial size was severely dilated.   4. Right atrial size was severely dilated.   5. The mitral valve is  abnormal. Moderate to severe mitral valve  regurgitation. There is moderate holosystolic prolapse of both leaflets of  the mitral valve.   6. Tricuspid valve regurgitation is severe.   7. The aortic valve is tricuspid. Aortic valve regurgitation is not  visualized. No aortic stenosis is present.   8. The inferior vena cava is normal in size with greater than 50%  respiratory variability, suggesting right atrial pressure of 3 mmHg. __________  2D echo 03/27/2019: 1. The left ventricle has normal systolic function, with an ejection  fraction of 55-60%. The cavity size was normal. There is mildly increased  left ventricular wall thickness. Left ventricular diastolic Doppler  parameters are indeterminate.  2. The right ventricle has normal systolic function. The cavity was  normal. There is no increase in right ventricular wall thickness. Right  ventricular systolic pressure is mildly elevated with an estimated  pressure of 39.2 mmHg.   3. Left atrial size was severely dilated.   4. Right atrial size was severely dilated.   5. Mitral valve regurgitation is moderate to severe   6. Tricuspid valve regurgitation is moderate-severe.   7. Rhythm appears to be atrial fibrillation   8. Details above appear consistent with prior study dated 06/2017   EKG:  EKG is ordered today.  The EKG ordered today demonstrates Afib, 75 bpm, rare PVC, nonspecific st/t changes  Recent Labs: 05/25/2021: ALT 16; BUN 13; Creatinine, Ser 0.50; Hemoglobin 14.4; Magnesium 1.8; Platelets 272.0; Potassium 3.7; Sodium 138; TSH 4.85  Recent Lipid Panel    Component Value Date/Time   CHOL 134 05/25/2021 1633   CHOL 198 06/03/2018 1026   TRIG 99.0 05/25/2021 1633   HDL 49.80 05/25/2021 1633   HDL 75 06/03/2018 1026   CHOLHDL 3 05/25/2021 1633   VLDL 19.8 05/25/2021 1633   LDLCALC 64 05/25/2021 1633   LDLCALC 98 06/03/2018 1026    PHYSICAL EXAM:    VS:  BP 100/60 (BP Location: Left Arm, Patient Position:  Sitting, Cuff Size: Normal)    Pulse 75    Ht 5\' 6"  (1.676 m)    Wt 160 lb (72.6 kg)    SpO2 97%    BMI 25.82 kg/m   BMI: Body mass index is 25.82 kg/m.  Physical Exam Vitals reviewed.  Constitutional:      Appearance: She is well-developed.  HENT:     Head: Normocephalic and atraumatic.  Eyes:     General:        Right eye: No discharge.        Left eye: No discharge.  Neck:     Vascular: No JVD.  Cardiovascular:     Rate and Rhythm: Normal rate. Rhythm irregularly irregular.     Pulses:          Dorsalis pedis pulses are 2+ on the right side and 2+ on the left side.       Posterior tibial pulses are 2+ on the right side and 2+ on the left side.     Heart sounds: S1 normal and S2 normal. Heart sounds not distant. No midsystolic click and no opening snap. Murmur heard.  High-pitched blowing holosystolic murmur is present with a grade of 1/6 at the apex.    No friction rub.  Pulmonary:     Effort: Pulmonary effort is normal. No respiratory distress.     Breath sounds: Normal breath sounds. No decreased breath sounds, wheezing or rales.  Chest:     Chest wall: No tenderness.  Abdominal:     General: There is no distension.     Palpations: Abdomen is soft.     Tenderness: There is no abdominal tenderness.  Musculoskeletal:     Cervical back: Normal range of motion.     Right lower leg: No edema.     Left lower leg: No edema.  Skin:    General: Skin is warm and dry.     Nails: There is no clubbing.  Neurological:     Mental Status: She is alert and oriented to person, place, and time.  Psychiatric:        Speech: Speech normal.        Behavior: Behavior normal.  Thought Content: Thought content normal.        Judgment: Judgment normal.    Wt Readings from Last 3 Encounters:  08/12/21 160 lb (72.6 kg)  07/01/21 160 lb (72.6 kg)  05/25/21 160 lb (72.6 kg)     ASSESSMENT & PLAN:   Permanent Afib: Ventricular rates well controlled.  Continue rate control with  Toprol-XL.  Given a CHADS2VASc of 3, she remains on Xarelto and is without symptoms concerning for bleeding.  Recent CBC normal.   HFpEF: She appears euvolemic and well compensated.  Not currently requiring a standing diuretic.  Mitral and tricuspid regurgitation: Overall largely stable on recent echo.  Asymptomatic.  Continue to monitor periodically.  Chronic venous insufficiency: No symptoms consistent with claudication. Lower extremity imaging normal.  Recommend compression stockings.      Disposition: F/u with Dr. Fletcher Anon or an APP in 6 months.   Medication Adjustments/Labs and Tests Ordered: Current medicines are reviewed at length with the patient today.  Concerns regarding medicines are outlined above. Medication changes, Labs and Tests ordered today are summarized above and listed in the Patient Instructions accessible in Encounters.   Signed, Christell Faith, PA-C 08/12/2021 1:10 PM     [address] [place], [place] [address] [phone]

## 2021-09-27 ENCOUNTER — Other Ambulatory Visit: Payer: Self-pay

## 2021-09-27 DIAGNOSIS — E039 Hypothyroidism, unspecified: Secondary | ICD-10-CM

## 2021-09-27 MED ORDER — SYNTHROID 75 MCG PO TABS: ORAL_TABLET | ORAL | 1 refills | Status: DC

## 2021-09-28 ENCOUNTER — Ambulatory Visit: Payer: Medicare HMO | Admitting: Obstetrics and Gynecology

## 2021-10-31 DIAGNOSIS — L570 Actinic keratosis: Secondary | ICD-10-CM | POA: Diagnosis not present

## 2021-11-12 ENCOUNTER — Encounter (HOSPITAL_COMMUNITY): Payer: Self-pay

## 2021-11-12 ENCOUNTER — Inpatient Hospital Stay (HOSPITAL_COMMUNITY)
Admission: EM | Admit: 2021-11-12 | Discharge: 2021-11-16 | DRG: 964 | Disposition: A | Discharge status: Skilled Nursing Facility | Payer: Medicare HMO | Attending: Internal Medicine | Admitting: Internal Medicine

## 2021-11-12 ENCOUNTER — Emergency Department (HOSPITAL_COMMUNITY): Payer: Medicare HMO

## 2021-11-12 ENCOUNTER — Other Ambulatory Visit: Payer: Self-pay

## 2021-11-12 DIAGNOSIS — T796XXD Traumatic ischemia of muscle, subsequent encounter: Secondary | ICD-10-CM | POA: Diagnosis not present

## 2021-11-12 DIAGNOSIS — G4700 Insomnia, unspecified: Secondary | ICD-10-CM | POA: Diagnosis not present

## 2021-11-12 DIAGNOSIS — M47812 Spondylosis without myelopathy or radiculopathy, cervical region: Secondary | ICD-10-CM | POA: Diagnosis not present

## 2021-11-12 DIAGNOSIS — W19XXXA Unspecified fall, initial encounter: Principal | ICD-10-CM

## 2021-11-12 DIAGNOSIS — M6282 Rhabdomyolysis: Secondary | ICD-10-CM | POA: Diagnosis present

## 2021-11-12 DIAGNOSIS — Z7901 Long term (current) use of anticoagulants: Secondary | ICD-10-CM | POA: Diagnosis not present

## 2021-11-12 DIAGNOSIS — S32810A Multiple fractures of pelvis with stable disruption of pelvic ring, initial encounter for closed fracture: Principal | ICD-10-CM | POA: Diagnosis present

## 2021-11-12 DIAGNOSIS — M47817 Spondylosis without myelopathy or radiculopathy, lumbosacral region: Secondary | ICD-10-CM | POA: Diagnosis not present

## 2021-11-12 DIAGNOSIS — W010XXA Fall on same level from slipping, tripping and stumbling without subsequent striking against object, initial encounter: Secondary | ICD-10-CM | POA: Diagnosis present

## 2021-11-12 DIAGNOSIS — Z79890 Hormone replacement therapy (postmenopausal): Secondary | ICD-10-CM

## 2021-11-12 DIAGNOSIS — M11261 Other chondrocalcinosis, right knee: Secondary | ICD-10-CM | POA: Diagnosis not present

## 2021-11-12 DIAGNOSIS — M533 Sacrococcygeal disorders, not elsewhere classified: Secondary | ICD-10-CM | POA: Diagnosis not present

## 2021-11-12 DIAGNOSIS — N9489 Other specified conditions associated with female genital organs and menstrual cycle: Secondary | ICD-10-CM | POA: Diagnosis not present

## 2021-11-12 DIAGNOSIS — T796XXA Traumatic ischemia of muscle, initial encounter: Secondary | ICD-10-CM | POA: Diagnosis present

## 2021-11-12 DIAGNOSIS — I10 Essential (primary) hypertension: Secondary | ICD-10-CM | POA: Diagnosis not present

## 2021-11-12 DIAGNOSIS — F39 Unspecified mood [affective] disorder: Secondary | ICD-10-CM | POA: Diagnosis present

## 2021-11-12 DIAGNOSIS — I517 Cardiomegaly: Secondary | ICD-10-CM | POA: Diagnosis not present

## 2021-11-12 DIAGNOSIS — S329XXA Fracture of unspecified parts of lumbosacral spine and pelvis, initial encounter for closed fracture: Secondary | ICD-10-CM | POA: Diagnosis present

## 2021-11-12 DIAGNOSIS — E039 Hypothyroidism, unspecified: Secondary | ICD-10-CM | POA: Diagnosis present

## 2021-11-12 DIAGNOSIS — S72001A Fracture of unspecified part of neck of right femur, initial encounter for closed fracture: Secondary | ICD-10-CM | POA: Diagnosis present

## 2021-11-12 DIAGNOSIS — M6281 Muscle weakness (generalized): Secondary | ICD-10-CM | POA: Diagnosis not present

## 2021-11-12 DIAGNOSIS — K661 Hemoperitoneum: Secondary | ICD-10-CM | POA: Diagnosis not present

## 2021-11-12 DIAGNOSIS — Z83438 Family history of other disorder of lipoprotein metabolism and other lipidemia: Secondary | ICD-10-CM

## 2021-11-12 DIAGNOSIS — S3993XA Unspecified injury of pelvis, initial encounter: Secondary | ICD-10-CM | POA: Diagnosis not present

## 2021-11-12 DIAGNOSIS — Z87891 Personal history of nicotine dependence: Secondary | ICD-10-CM

## 2021-11-12 DIAGNOSIS — Z8249 Family history of ischemic heart disease and other diseases of the circulatory system: Secondary | ICD-10-CM

## 2021-11-12 DIAGNOSIS — S32119A Unspecified Zone I fracture of sacrum, initial encounter for closed fracture: Secondary | ICD-10-CM | POA: Diagnosis not present

## 2021-11-12 DIAGNOSIS — I4819 Other persistent atrial fibrillation: Secondary | ICD-10-CM | POA: Diagnosis present

## 2021-11-12 DIAGNOSIS — F32A Depression, unspecified: Secondary | ICD-10-CM | POA: Diagnosis not present

## 2021-11-12 DIAGNOSIS — S3282XA Multiple fractures of pelvis without disruption of pelvic ring, initial encounter for closed fracture: Secondary | ICD-10-CM | POA: Diagnosis not present

## 2021-11-12 DIAGNOSIS — S3210XA Unspecified fracture of sacrum, initial encounter for closed fracture: Secondary | ICD-10-CM

## 2021-11-12 DIAGNOSIS — M1611 Unilateral primary osteoarthritis, right hip: Secondary | ICD-10-CM | POA: Diagnosis not present

## 2021-11-12 DIAGNOSIS — Z86010 Personal history of colonic polyps: Secondary | ICD-10-CM | POA: Diagnosis not present

## 2021-11-12 DIAGNOSIS — M4316 Spondylolisthesis, lumbar region: Secondary | ICD-10-CM | POA: Diagnosis not present

## 2021-11-12 DIAGNOSIS — M47816 Spondylosis without myelopathy or radiculopathy, lumbar region: Secondary | ICD-10-CM | POA: Diagnosis present

## 2021-11-12 DIAGNOSIS — Z85038 Personal history of other malignant neoplasm of large intestine: Secondary | ICD-10-CM | POA: Diagnosis not present

## 2021-11-12 DIAGNOSIS — Z885 Allergy status to narcotic agent status: Secondary | ICD-10-CM

## 2021-11-12 DIAGNOSIS — S32810S Multiple fractures of pelvis with stable disruption of pelvic ring, sequela: Secondary | ICD-10-CM | POA: Diagnosis not present

## 2021-11-12 DIAGNOSIS — M545 Low back pain, unspecified: Secondary | ICD-10-CM | POA: Diagnosis not present

## 2021-11-12 DIAGNOSIS — F339 Major depressive disorder, recurrent, unspecified: Secondary | ICD-10-CM | POA: Diagnosis not present

## 2021-11-12 DIAGNOSIS — S0990XA Unspecified injury of head, initial encounter: Secondary | ICD-10-CM | POA: Diagnosis not present

## 2021-11-12 DIAGNOSIS — Z79899 Other long term (current) drug therapy: Secondary | ICD-10-CM | POA: Diagnosis not present

## 2021-11-12 DIAGNOSIS — M549 Dorsalgia, unspecified: Secondary | ICD-10-CM | POA: Diagnosis not present

## 2021-11-12 DIAGNOSIS — M5430 Sciatica, unspecified side: Secondary | ICD-10-CM | POA: Diagnosis not present

## 2021-11-12 DIAGNOSIS — I4811 Longstanding persistent atrial fibrillation: Secondary | ICD-10-CM

## 2021-11-12 DIAGNOSIS — S32511A Fracture of superior rim of right pubis, initial encounter for closed fracture: Secondary | ICD-10-CM

## 2021-11-12 DIAGNOSIS — E559 Vitamin D deficiency, unspecified: Secondary | ICD-10-CM | POA: Diagnosis not present

## 2021-11-12 DIAGNOSIS — I4891 Unspecified atrial fibrillation: Secondary | ICD-10-CM | POA: Diagnosis not present

## 2021-11-12 DIAGNOSIS — Z880 Allergy status to penicillin: Secondary | ICD-10-CM

## 2021-11-12 DIAGNOSIS — S3992XA Unspecified injury of lower back, initial encounter: Secondary | ICD-10-CM | POA: Diagnosis not present

## 2021-11-12 DIAGNOSIS — Z9049 Acquired absence of other specified parts of digestive tract: Secondary | ICD-10-CM

## 2021-11-12 DIAGNOSIS — S3210XS Unspecified fracture of sacrum, sequela: Secondary | ICD-10-CM | POA: Diagnosis not present

## 2021-11-12 DIAGNOSIS — Z888 Allergy status to other drugs, medicaments and biological substances status: Secondary | ICD-10-CM | POA: Diagnosis not present

## 2021-11-12 DIAGNOSIS — R52 Pain, unspecified: Secondary | ICD-10-CM | POA: Diagnosis not present

## 2021-11-12 DIAGNOSIS — Z043 Encounter for examination and observation following other accident: Secondary | ICD-10-CM | POA: Diagnosis not present

## 2021-11-12 LAB — CBC WITH DIFFERENTIAL/PLATELET
Abs Immature Granulocytes: 0.03 10*3/uL (ref 0.00–0.07)
Basophils Absolute: 0 10*3/uL (ref 0.0–0.1)
Basophils Relative: 0 %
Eosinophils Absolute: 0 10*3/uL (ref 0.0–0.5)
Eosinophils Relative: 0 %
HCT: 38.1 % (ref 36.0–46.0)
Hemoglobin: 13 g/dL (ref 12.0–15.0)
Immature Granulocytes: 0 %
Lymphocytes Relative: 4 %
Lymphs Abs: 0.4 10*3/uL — ABNORMAL LOW (ref 0.7–4.0)
MCH: 36 pg — ABNORMAL HIGH (ref 26.0–34.0)
MCHC: 34.1 g/dL (ref 30.0–36.0)
MCV: 105.5 fL — ABNORMAL HIGH (ref 80.0–100.0)
Monocytes Absolute: 0.5 10*3/uL (ref 0.1–1.0)
Monocytes Relative: 6 %
Neutro Abs: 7.4 10*3/uL (ref 1.7–7.7)
Neutrophils Relative %: 90 %
Platelets: 167 10*3/uL (ref 150–400)
RBC: 3.61 MIL/uL — ABNORMAL LOW (ref 3.87–5.11)
RDW: 13.2 % (ref 11.5–15.5)
WBC: 8.4 10*3/uL (ref 4.0–10.5)
nRBC: 0 % (ref 0.0–0.2)

## 2021-11-12 LAB — BASIC METABOLIC PANEL
Anion gap: 10 (ref 5–15)
BUN: 12 mg/dL (ref 8–23)
CO2: 22 mmol/L (ref 22–32)
Calcium: 8.9 mg/dL (ref 8.9–10.3)
Chloride: 105 mmol/L (ref 98–111)
Creatinine, Ser: 0.53 mg/dL (ref 0.44–1.00)
GFR, Estimated: >60 mL/min (ref >60)
Glucose, Bld: 182 mg/dL — ABNORMAL HIGH (ref 70–99)
Potassium: 3.7 mmol/L (ref 3.5–5.1)
Sodium: 137 mmol/L (ref 135–145)

## 2021-11-12 LAB — TYPE AND SCREEN
ABO/RH(D): O POS
Antibody Screen: NEGATIVE

## 2021-11-12 LAB — CK: Total CK: 843 U/L — ABNORMAL HIGH (ref 38–234)

## 2021-11-12 LAB — PROTIME-INR
INR: 1.5 — ABNORMAL HIGH (ref 0.8–1.2)
Prothrombin Time: 17.5 seconds — ABNORMAL HIGH (ref 11.4–15.2)

## 2021-11-12 LAB — TSH: TSH: 1.7 u[IU]/mL (ref 0.350–4.500)

## 2021-11-12 LAB — MAGNESIUM: Magnesium: 1.9 mg/dL (ref 1.7–2.4)

## 2021-11-12 LAB — PHOSPHORUS: Phosphorus: 2.6 mg/dL (ref 2.5–4.6)

## 2021-11-12 MED ORDER — METHOCARBAMOL 1000 MG/10ML IJ SOLN
500.0000 mg | Freq: Three times a day (TID) | INTRAVENOUS | Status: DC | PRN
  Filled 2021-11-12: qty 5

## 2021-11-12 MED ORDER — SERTRALINE HCL 50 MG PO TABS
50.0000 mg | ORAL_TABLET | Freq: Every day | ORAL | Status: DC
  Administered 2021-11-12 – 2021-11-16 (×5): 50 mg via ORAL
  Filled 2021-11-12 (×5): qty 1

## 2021-11-12 MED ORDER — ONDANSETRON HCL 4 MG PO TABS: 4.0000 mg | ORAL_TABLET | Freq: Four times a day (QID) | ORAL | Status: DC | PRN

## 2021-11-12 MED ORDER — RIVAROXABAN 20 MG PO TABS
20.0000 mg | ORAL_TABLET | Freq: Every morning | ORAL | Status: DC
  Administered 2021-11-13 – 2021-11-16 (×4): 20 mg via ORAL
  Filled 2021-11-12 (×4): qty 1

## 2021-11-12 MED ORDER — ACETAMINOPHEN 325 MG PO TABS: 650.0000 mg | ORAL_TABLET | Freq: Four times a day (QID) | ORAL | Status: DC | PRN

## 2021-11-12 MED ORDER — FENTANYL CITRATE PF 50 MCG/ML IJ SOSY
50.0000 ug | PREFILLED_SYRINGE | Freq: Once | INTRAMUSCULAR | Status: AC
  Administered 2021-11-12: 50 ug via INTRAVENOUS
  Filled 2021-11-12: qty 1

## 2021-11-12 MED ORDER — SODIUM CHLORIDE 0.9 % IV BOLUS
1000.0000 mL | Freq: Once | INTRAVENOUS | Status: AC
  Administered 2021-11-12: 1000 mL via INTRAVENOUS

## 2021-11-12 MED ORDER — OXYCODONE HCL 5 MG PO TABS
5.0000 mg | ORAL_TABLET | Freq: Four times a day (QID) | ORAL | Status: DC | PRN
  Administered 2021-11-12 – 2021-11-16 (×6): 5 mg via ORAL
  Filled 2021-11-12 (×6): qty 1

## 2021-11-12 MED ORDER — ADULT MULTIVITAMIN W/MINERALS CH
1.0000 | ORAL_TABLET | Freq: Every day | ORAL | Status: DC
  Administered 2021-11-12 – 2021-11-16 (×5): 1 via ORAL
  Filled 2021-11-12 (×5): qty 1

## 2021-11-12 MED ORDER — SODIUM CHLORIDE 0.9 % IV SOLN: INTRAVENOUS | Status: DC

## 2021-11-12 MED ORDER — LEVOTHYROXINE SODIUM 75 MCG PO TABS
75.0000 ug | ORAL_TABLET | Freq: Every day | ORAL | Status: DC
  Administered 2021-11-13 – 2021-11-16 (×4): 75 ug via ORAL
  Filled 2021-11-12 (×4): qty 1

## 2021-11-12 MED ORDER — ACETAMINOPHEN 650 MG RE SUPP: 650.0000 mg | Freq: Four times a day (QID) | RECTAL | Status: DC | PRN

## 2021-11-12 MED ORDER — OYSTER SHELL CALCIUM/D3 500-5 MG-MCG PO TABS
1.0000 | ORAL_TABLET | Freq: Every day | ORAL | Status: DC
  Administered 2021-11-12 – 2021-11-16 (×5): 1 via ORAL
  Filled 2021-11-12 (×5): qty 1

## 2021-11-12 MED ORDER — HYDROMORPHONE HCL 1 MG/ML IJ SOLN
0.5000 mg | INTRAMUSCULAR | Status: DC | PRN
  Administered 2021-11-12 (×2): 0.5 mg via INTRAVENOUS
  Administered 2021-11-13 (×2): 1 mg via INTRAVENOUS
  Filled 2021-11-12: qty 1
  Filled 2021-11-12: qty 0.5
  Filled 2021-11-12 (×2): qty 1

## 2021-11-12 MED ORDER — MORPHINE SULFATE (PF) 4 MG/ML IV SOLN
4.0000 mg | Freq: Once | INTRAVENOUS | Status: DC
  Filled 2021-11-12: qty 1

## 2021-11-12 MED ORDER — ONDANSETRON HCL 4 MG/2ML IJ SOLN: 4.0000 mg | Freq: Four times a day (QID) | INTRAMUSCULAR | Status: DC | PRN

## 2021-11-12 MED ORDER — SODIUM CHLORIDE 0.9 % IV SOLN: INTRAVENOUS | Status: AC

## 2021-11-12 MED ORDER — ONDANSETRON HCL 4 MG/2ML IJ SOLN
4.0000 mg | Freq: Once | INTRAMUSCULAR | Status: AC
  Administered 2021-11-12: 4 mg via INTRAVENOUS
  Filled 2021-11-12: qty 2

## 2021-11-12 MED ORDER — METOPROLOL SUCCINATE ER 50 MG PO TB24
50.0000 mg | ORAL_TABLET | Freq: Every day | ORAL | Status: DC
  Administered 2021-11-12 – 2021-11-16 (×5): 50 mg via ORAL
  Filled 2021-11-12 (×5): qty 1

## 2021-11-12 NOTE — ED Provider Notes (Signed)
?[facility] ?Provider Note ? ? ?CSN: [id number] ?Arrival date & time: 11/12/21  1141 ? ?  ? ?History ? ?Chief Complaint  ?Patient presents with  ? Fall  ? ? ?Taylor Mccarthy is a 81 y.o. female. ? ?Pt is a 81 yo female with a pmhx significant for colon cancer s/p rsxn, afib on Xarelto, MR, and low back pain.  Pt lives alone.  She fell last night while going to the bathroom.  She hit her head and her right hip.  She was unable to get up.  She denies loc.  She said she tried getting to her phone all night and then this am and was finally able to drag herself to the phone.  She called her neighbor who called EMS.  Pt was given 50 mcg of fentanyl en route by EMS and that has helped the pain.  Pt denies n/v.  No dizziness.  ? ? ?  ? ?Home Medications ?Prior to Admission medications   ?Medication Sig Start Date End Date Taking? Authorizing Provider  ?Calcium Carbonate (CALCIUM 600 PO) Take 600 mg by mouth daily.   Yes [provider]  ?metoprolol succinate (TOPROL-XL) 50 MG 24 hr tablet TAKE 1 TABLET BY MOUTH ONCE DAILY. NEEDS OFFICE VISIT FOR FURTHER REFILLS. ?Patient taking differently: Take 50 mg by mouth daily. TAKE 1 TABLET BY MOUTH ONCE DAILY. NEEDS OFFICE VISIT FOR FURTHER REFILLS. 06/17/21  Yes McLean-Scocuzza, Nino Glow, MD  ?Multiple Vitamin (MULTIVITAMIN) tablet Take 1 tablet by mouth daily.   Yes [provider]  ?sertraline (ZOLOFT) 50 MG tablet TAKE 1 TABLET EVERY DAY IN THE MORNING ?Patient taking differently: Take 50 mg by mouth daily. 10/12/20  Yes McLean-Scocuzza, Nino Glow, MD  ?SYNTHROID 75 MCG tablet TAKE 1 TAB (75 MCG) DAILY BEFORE BREAKFAST. NO FURTHER REFILLS NEEDS APPT IN OFFICE/CALL FOR APPOINTMENT ASAP ?Patient taking differently: Take 75 mcg by mouth daily before breakfast. TAKE 1 TAB (75 MCG) DAILY BEFORE BREAKFAST. NO FURTHER REFILLS NEEDS APPT IN OFFICE/CALL FOR APPOINTMENT ASAP 09/27/21  Yes McLean-Scocuzza, Nino Glow, MD  ?XARELTO 20 MG TABS tablet TAKE 1 TABLET  EVERY DAY WITH SUPPER ?Patient taking differently: Take 20 mg by mouth in the morning. 04/06/21  Yes Wellington Hampshire, MD  ?   ? ?Allergies    ?Morpholine salicylate, Augmentin [amoxicillin-pot clavulanate], Codeine, and Other   ? ?Review of Systems   ?Review of Systems  ?Musculoskeletal:  Positive for back pain.  ?     Right hip pain  ?All other systems reviewed and are negative. ? ?Physical Exam ?Updated Vital Signs ?BP 116/72   Pulse 81   Temp 98.3 ?F (36.8 ?C)   Resp 17   Ht '5\' 6"'$  (1.676 m)   Wt 76.6 kg   SpO2 95%   BMI 27.25 kg/m?  ?Physical Exam ?Vitals and nursing note reviewed.  ?Constitutional:   ?   Appearance: Normal appearance.  ?HENT:  ?   Head: Normocephalic and atraumatic.  ?   Right Ear: External ear normal.  ?   Left Ear: External ear normal.  ?   Nose: Nose normal.  ?   Mouth/Throat:  ?   Mouth: Mucous membranes are dry.  ?   Pharynx: Oropharynx is clear.  ?Eyes:  ?   Extraocular Movements: Extraocular movements intact.  ?   Conjunctiva/sclera: Conjunctivae normal.  ?   Pupils: Pupils are equal, round, and reactive to light.  ?Cardiovascular:  ?   Rate and Rhythm: Normal  rate. Rhythm irregular.  ?   Pulses: Normal pulses.  ?   Heart sounds: Normal heart sounds.  ?Pulmonary:  ?   Effort: Pulmonary effort is normal.  ?   Breath sounds: Normal breath sounds.  ?Abdominal:  ?   General: Abdomen is flat. Bowel sounds are normal.  ?   Palpations: Abdomen is soft.  ?Musculoskeletal:  ?   Cervical back: Normal range of motion and neck supple.  ?     Back: ? ?     Legs: ? ?Skin: ?   General: Skin is warm.  ?   Capillary Refill: Capillary refill takes less than 2 seconds.  ?Neurological:  ?   General: No focal deficit present.  ?   Mental Status: She is alert and oriented to person, place, and time.  ?Psychiatric:     ?   Mood and Affect: Mood normal.     ?   Behavior: Behavior normal.  ? ? ?ED Results / Procedures / Treatments   ?Labs ?(all labs ordered are listed, but only abnormal results are  displayed) ?Labs Reviewed  ?BASIC METABOLIC PANEL - Abnormal; Notable for the following components:  ?    Result Value  ? Glucose, Bld 182 (*)   ? All other components within normal limits  ?CBC WITH DIFFERENTIAL/PLATELET - Abnormal; Notable for the following components:  ? RBC 3.61 (*)   ? MCV 105.5 (*)   ? MCH 36.0 (*)   ? Lymphs Abs 0.4 (*)   ? All other components within normal limits  ?PROTIME-INR - Abnormal; Notable for the following components:  ? Prothrombin Time 17.5 (*)   ? INR 1.5 (*)   ? All other components within normal limits  ?CK - Abnormal; Notable for the following components:  ? Total CK 843 (*)   ? All other components within normal limits  ?URINALYSIS, ROUTINE W REFLEX MICROSCOPIC  ?TYPE AND SCREEN  ? ? ?EKG ?EKG Interpretation ? ?Date/Time:  Saturday November 12 2021 11:52:43 EDT ?Ventricular Rate:  83 ?PR Interval:    ?QRS Duration: 98 ?QT Interval:  483 ?QTC Calculation: 568 ?R Axis:   61 ?Text Interpretation: Atrial fibrillation Borderline low voltage, extremity leads Nonspecific repol abnormality, diffuse leads Prolonged QT interval No significant change since last tracing Confirmed by Isla Pence [phone]) on 11/12/2021 12:21:07 PM ? ?Radiology ?DG Chest 1 View ? ?Result Date: 11/12/2021 ?CLINICAL DATA:  Fall. Low lumbar pain which radiates to posterior right hip and knee. EXAM: CHEST  1 VIEW COMPARISON:  None FINDINGS: Cardiac enlargement. No pleural effusion or edema. No airspace opacities identified. Visualized osseous structures are unremarkable. IMPRESSION: 1. No acute cardiopulmonary abnormalities. 2. Cardiac enlargement. Electronically Signed   By: Kerby Moors M.D.   On: 11/12/2021 13:15  ? ?DG Lumbar Spine Complete ? ?Result Date: 11/12/2021 ?CLINICAL DATA:  Trauma, fall EXAM: LUMBAR SPINE - COMPLETE 4+ VIEW COMPARISON:  None. FINDINGS: No recent fracture is seen. Osteopenia is seen in bony structures. There is 11 mm anterolisthesis at L4-L5 level. There is 5 mm anterolisthesis at  L3-L4 level. Degenerative changes are noted with bony spurs, disc space narrowing and facet hypertrophy at multiple levels in the lumbar spine. Bony spurs seen in the lower thoracic spine. There is mild levoscoliosis. IMPRESSION: No recent fracture is seen in the lumbar spine. Lumbar spondylosis, particularly severe in the facet joints at L4-L5 and L5-S1 levels. Anterolisthesis is seen at L3-L4 and L4-L5 levels, more so at L4-L5 level. Electronically Signed  By: Elmer Picker M.D.   On: 11/12/2021 13:18  ? ?DG Sacrum/Coccyx ? ?Result Date: 11/12/2021 ?CLINICAL DATA:  Trauma, fall EXAM: SACRUM AND COCCYX - 2+ VIEW COMPARISON:  None. FINDINGS: There is fracture in the right iliac bone adjacent to the right acetabulum. There is a proximally 11 mm offset in the alignment of fracture fragments. Comminuted fracture is seen in the left superior pubic ramus. Fracture is seen in the junction of body and inferior ramus in the right pubic bone. Evaluation of sacrum and coccyx is limited without lateral projection. As far as seen, no displaced fractures are seen in the sacrum and coccyx in the AP views. Surgical staples are seen in the pelvis. Degenerative changes are noted in the SI joints and visualized lower lumbar spine. IMPRESSION: Displaced fracture is seen in the right iliac bone in the medial margin of right acetabulum. Comminuted fractures are seen in the left superior pubic ramus and right inferior pubic ramus. These fractures are better evaluated in the radiographs of hip done today. Evaluation of sacrum and coccyx is limited without lateral projection. As far as seen, there are no grossly displaced fractures in the sacrum and coccyx. Electronically Signed   By: Elmer Picker M.D.   On: 11/12/2021 13:24  ? ?CT HEAD WO CONTRAST ? ?Result Date: 11/12/2021 ?CLINICAL DATA:  Head trauma.  Status post fall. EXAM: CT HEAD WITHOUT CONTRAST CT CERVICAL SPINE WITHOUT CONTRAST TECHNIQUE: Multidetector CT imaging of  the head and cervical spine was performed following the standard protocol without intravenous contrast. Multiplanar CT image reconstructions of the cervical spine were also generated. RADIATION DOSE REDUCTION: This e

## 2021-11-12 NOTE — Assessment & Plan Note (Addendum)
-  Rate controlled ?-Continue the use of metoprolol and Xarelto for secondary prevention. ?-Hemoglobin levels has remained stable; patient is hemodynamically stable. ?

## 2021-11-12 NOTE — H&P (Signed)
?History and Physical  ? ? ?Patient: Taylor Mccarthy GEX:[id number] DOB: 27-Jun-1941 ?DOA: 11/12/2021 ?DOS: the patient was seen and examined on 11/12/2021 ?PCP: McLean-Scocuzza, Nino Glow, MD  ?Patient coming from: Home ? ?Chief Complaint:  ?Chief Complaint  ?Patient presents with  ? Fall  ? ?HPI: Taylor Mccarthy is a 81 y.o. female with medical history significant of persistent atrial fibrillation on chronic Xarelto, vitamin D deficiency, history of colon cancer s/p resection 1999, depression and hypothyroidism; who presented to the emergency department after mechanical fall at home and inability to get up.  Patient reports tripping and landing on her right side with inability to stand or independently walk after the fall.  Patient expressed having to stay multiple hours on the floor after she finally was able to scoop and reached the phone to request assistance.  Patient denies dysuria, hematuria, melena, hematochezia, chest pain, shortness of breath, loss of consciousness, focal weakness, headaches or any other complaints. ?Of note, patient reports hitting her head in the way down after falling but never lost consciousness as mentioned above. ? ?Vaccinated and boosted against COVID. ? ?Work-up in the ED demonstrating pelvic fracture affecting superior and inferior pubic rami; there is also sacral fracture and surrounding hematoma.  Case was discussed with orthopedic service who did not feel the need for surgical intervention and recommended admission for pain management and physical therapy evaluation. ? ?Patient lives alone and was able to perform all her activities of daily living independently prior to this admission.  Hemodynamically stable. ? ?Review of Systems: As mentioned in the history of present illness. All other systems reviewed and are negative. ?Past Medical History:  ?Diagnosis Date  ? Atrial fibrillation, persistent ([place])   ? Cancer [facility])   ? colon cancer 1999 s/p resection   ? History of colon polyps   ?  Insomnia   ? Low back pain   ? Mitral regurgitation   ? Pap smear abnormality of cervix with ASCUS favoring benign   ? Sciatica   ? Vitamin D deficiency   ? ?Past Surgical History:  ?Procedure Laterality Date  ? COLON RESECTION    ? 1999 for cancer   ? TONSILLECTOMY    ? WISDOM TOOTH EXTRACTION    ? ?Social History:  reports that she has quit smoking. She has never used smokeless tobacco. She reports current alcohol use of about 14.0 standard drinks per week. She reports that she does not currently use drugs. ? ?Allergies  ?Allergen Reactions  ? Morpholine Salicylate   ? Augmentin [Amoxicillin-Pot Clavulanate]   ?  Nausea x 3 days  ? Codeine   ?  Nausea  ?  ? Other   ?  Tree pollen  ?  ? ? ?Family History  ?Problem Relation Age of Onset  ? Heart disease Father   ? Hyperlipidemia Father   ? Osteopenia Sister   ? ? ?Prior to Admission medications   ?Medication Sig Start Date End Date Taking? Authorizing Provider  ?Calcium Carbonate (CALCIUM 600 PO) Take 600 mg by mouth daily.   Yes [provider]  ?metoprolol succinate (TOPROL-XL) 50 MG 24 hr tablet TAKE 1 TABLET BY MOUTH ONCE DAILY. NEEDS OFFICE VISIT FOR FURTHER REFILLS. ?Patient taking differently: Take 50 mg by mouth daily. TAKE 1 TABLET BY MOUTH ONCE DAILY. NEEDS OFFICE VISIT FOR FURTHER REFILLS. 06/17/21  Yes McLean-Scocuzza, Nino Glow, MD  ?Multiple Vitamin (MULTIVITAMIN) tablet Take 1 tablet by mouth daily.   Yes [provider]  ?sertraline (ZOLOFT) 50  MG tablet TAKE 1 TABLET EVERY DAY IN THE MORNING ?Patient taking differently: Take 50 mg by mouth daily. 10/12/20  Yes McLean-Scocuzza, Nino Glow, MD  ?SYNTHROID 75 MCG tablet TAKE 1 TAB (75 MCG) DAILY BEFORE BREAKFAST. NO FURTHER REFILLS NEEDS APPT IN OFFICE/CALL FOR APPOINTMENT ASAP ?Patient taking differently: Take 75 mcg by mouth daily before breakfast. TAKE 1 TAB (75 MCG) DAILY BEFORE BREAKFAST. NO FURTHER REFILLS NEEDS APPT IN OFFICE/CALL FOR APPOINTMENT ASAP 09/27/21  Yes McLean-Scocuzza,  Nino Glow, MD  ?XARELTO 20 MG TABS tablet TAKE 1 TABLET EVERY DAY WITH SUPPER ?Patient taking differently: Take 20 mg by mouth in the morning. 04/06/21  Yes Wellington Hampshire, MD  ? ? ?Physical Exam: ?Vitals:  ? 11/12/21 1530 11/12/21 1545 11/12/21 1600 11/12/21 1615  ?BP: 97/63  112/83   ?Pulse: 94 89 92 78  ?Resp: '17 18 19 17  '$ ?Temp:      ?SpO2: 93% (!) 89% 92% 93%  ?Weight:      ?Height:      ? ?General exam: Alert, awake, oriented x 3; no chest pain, no shortness of breath, no nausea or vomiting.  Reporting pain on her right hip area and lower back. ?Respiratory system: Clear to auscultation. Respiratory effort normal.  Good saturation on room air. ?Cardiovascular system: Rate controlled, no rubs, no gallops, no JVD appreciated on exam.  Positive systolic murmur. ?Gastrointestinal system: Abdomen is nondistended, soft and nontender. No organomegaly or masses felt. Normal bowel sounds heard. ?Central nervous system: Alert and oriented. No focal neurological deficits. ?Extremities: No cyanosis or clubbing; decreased range of motion in her right lower leg secondary to pain. ?Skin: No petechiae. ?Psychiatry: Judgement and insight appear normal. Mood & affect appropriate.  ? ?Data Reviewed: ?Basic metabolic panel demonstrating a sodium of 137, potassium 3.7, chloride 105, bicarb 22, creatinine 0.53, BUN 12 ?CBC with a WBC of 8.4, hemoglobin 13.0, platelet counts 167 K ?CK level 843. ? ?Assessment and Plan: ?* Pelvic fracture ([place]) ?- Images demonstrating superior and inferior Pubic rami fracture of the right hip; but is also right sacral fracture appreciated on CT scan. ?-There is a moderate extraperitoneal hemorrhage and hematoma in the pelvis with also a small presacral hematoma. ?-Case discussed with orthopedic surgery who felt no need for surgical intervention currently ?-Patient has been admitted for pain management and physical therapy evaluation ?-She lives alone and currently is unable to perform her activities  of daily living. ?-Follow clinical response. ? ?Depression ?- Stable mood overall ?-Continue the use of sertraline. ? ?Vitamin D deficiency ?- Check vitamin D level ?-Continue the use of Oscal ? ?Hypothyroidism ?- Will check TSH ?-Continue Synthroid ? ?Persistent atrial fibrillation ([place]) ?- Rate controlled ?-Continue the use of metoprolol ?-Resume home Xarelto for secondary prevention ?-Given presence of hematoma after mechanical fall will follow hemoglobin trend. ?-Patient is hemodynamically stable. ? ?Mild traumatic rhabdomyolysis ?-Provide adequate hydration and gentle fluid resuscitation overnight ?-Continue to track CK levels. ? ? ? Advance Care Planning:   Code Status: Full Code  ? ?Consults: Therapeutic service consulted by EDP. ? ?Family Communication: No family at bedside. ? ?Severity of Illness: ?The appropriate patient status for this patient is OBSERVATION. Observation status is judged to be reasonable and necessary in order to provide the required intensity of service to ensure the patient's safety. The patient's presenting symptoms, physical exam findings, and initial radiographic and laboratory data in the context of their medical condition is felt to place them at decreased risk for further clinical  deterioration. Furthermore, it is anticipated that the patient will be medically stable for discharge from the hospital within 2 midnights of admission.  ? ?Author: ?Barton Dubois, MD ?11/12/2021 5:25 PM ? ?For on call review [email or web address].  ?

## 2021-11-12 NOTE — Assessment & Plan Note (Addendum)
-  Stable mood overall ?-Continue the use of sertraline. ?

## 2021-11-12 NOTE — ED Notes (Signed)
Pt. Given graham crackers per nurse directions ?

## 2021-11-12 NOTE — Assessment & Plan Note (Addendum)
-   Images demonstrating superior and inferior Pubic rami fracture of the right hip; and there is also right sacral fracture appreciated on CT scan. ?-There is a moderate extraperitoneal hemorrhage and hematoma in the pelvis with also a small presacral hematoma.  No evacuation needed. ?-Case discussed with orthopedic surgery who felt no need for surgical intervention required. ?-Continue analgesic therapy and PT/rehabiliatation at SNF. ?-Prior to admission patient was completely independent on her activities of daily living and living alone. ?

## 2021-11-12 NOTE — Assessment & Plan Note (Addendum)
Continue Synthroid °

## 2021-11-12 NOTE — ED Triage Notes (Signed)
PT arrives via EMS from home. Pt states sometime during the middle of the night she fell while going to the bathroom. Pt c/o pain to lower back, pelvis and right hip. Pt states she did hit her head, no loc, pt is on eliquis. Pt is alert and oriented x 4. Some shortening noted to right leg. Cms intact. Ems gave 53mg of fentanyl pta.  ?

## 2021-11-12 NOTE — Assessment & Plan Note (Addendum)
-  Vitamin D 45.39 ?-Continue the use of Oscal ?

## 2021-11-13 DIAGNOSIS — E039 Hypothyroidism, unspecified: Secondary | ICD-10-CM | POA: Diagnosis not present

## 2021-11-13 DIAGNOSIS — S32119A Unspecified Zone I fracture of sacrum, initial encounter for closed fracture: Secondary | ICD-10-CM | POA: Diagnosis present

## 2021-11-13 DIAGNOSIS — W010XXA Fall on same level from slipping, tripping and stumbling without subsequent striking against object, initial encounter: Secondary | ICD-10-CM | POA: Diagnosis present

## 2021-11-13 DIAGNOSIS — Z9049 Acquired absence of other specified parts of digestive tract: Secondary | ICD-10-CM | POA: Diagnosis not present

## 2021-11-13 DIAGNOSIS — Z880 Allergy status to penicillin: Secondary | ICD-10-CM | POA: Diagnosis not present

## 2021-11-13 DIAGNOSIS — E559 Vitamin D deficiency, unspecified: Secondary | ICD-10-CM | POA: Diagnosis present

## 2021-11-13 DIAGNOSIS — Z85038 Personal history of other malignant neoplasm of large intestine: Secondary | ICD-10-CM | POA: Diagnosis not present

## 2021-11-13 DIAGNOSIS — Z79899 Other long term (current) drug therapy: Secondary | ICD-10-CM | POA: Diagnosis not present

## 2021-11-13 DIAGNOSIS — Z888 Allergy status to other drugs, medicaments and biological substances status: Secondary | ICD-10-CM | POA: Diagnosis not present

## 2021-11-13 DIAGNOSIS — S32810A Multiple fractures of pelvis with stable disruption of pelvic ring, initial encounter for closed fracture: Secondary | ICD-10-CM | POA: Diagnosis present

## 2021-11-13 DIAGNOSIS — I4811 Longstanding persistent atrial fibrillation: Secondary | ICD-10-CM | POA: Diagnosis not present

## 2021-11-13 DIAGNOSIS — Z79890 Hormone replacement therapy (postmenopausal): Secondary | ICD-10-CM | POA: Diagnosis not present

## 2021-11-13 DIAGNOSIS — S329XXA Fracture of unspecified parts of lumbosacral spine and pelvis, initial encounter for closed fracture: Secondary | ICD-10-CM | POA: Diagnosis present

## 2021-11-13 DIAGNOSIS — Z87891 Personal history of nicotine dependence: Secondary | ICD-10-CM | POA: Diagnosis not present

## 2021-11-13 DIAGNOSIS — Z8249 Family history of ischemic heart disease and other diseases of the circulatory system: Secondary | ICD-10-CM | POA: Diagnosis not present

## 2021-11-13 DIAGNOSIS — Z7901 Long term (current) use of anticoagulants: Secondary | ICD-10-CM | POA: Diagnosis not present

## 2021-11-13 DIAGNOSIS — Z83438 Family history of other disorder of lipoprotein metabolism and other lipidemia: Secondary | ICD-10-CM | POA: Diagnosis not present

## 2021-11-13 DIAGNOSIS — Z86010 Personal history of colonic polyps: Secondary | ICD-10-CM | POA: Diagnosis not present

## 2021-11-13 DIAGNOSIS — T796XXD Traumatic ischemia of muscle, subsequent encounter: Secondary | ICD-10-CM

## 2021-11-13 DIAGNOSIS — S72001A Fracture of unspecified part of neck of right femur, initial encounter for closed fracture: Secondary | ICD-10-CM | POA: Diagnosis present

## 2021-11-13 DIAGNOSIS — M6282 Rhabdomyolysis: Secondary | ICD-10-CM | POA: Diagnosis present

## 2021-11-13 DIAGNOSIS — M47816 Spondylosis without myelopathy or radiculopathy, lumbar region: Secondary | ICD-10-CM | POA: Diagnosis present

## 2021-11-13 DIAGNOSIS — T796XXA Traumatic ischemia of muscle, initial encounter: Secondary | ICD-10-CM | POA: Diagnosis present

## 2021-11-13 DIAGNOSIS — S3210XA Unspecified fracture of sacrum, initial encounter for closed fracture: Secondary | ICD-10-CM | POA: Diagnosis not present

## 2021-11-13 DIAGNOSIS — F32A Depression, unspecified: Secondary | ICD-10-CM | POA: Diagnosis present

## 2021-11-13 DIAGNOSIS — G4700 Insomnia, unspecified: Secondary | ICD-10-CM | POA: Diagnosis present

## 2021-11-13 DIAGNOSIS — Z885 Allergy status to narcotic agent status: Secondary | ICD-10-CM | POA: Diagnosis not present

## 2021-11-13 LAB — CBC
HCT: 35 % — ABNORMAL LOW (ref 36.0–46.0)
Hemoglobin: 11.4 g/dL — ABNORMAL LOW (ref 12.0–15.0)
MCH: 35.2 pg — ABNORMAL HIGH (ref 26.0–34.0)
MCHC: 32.6 g/dL (ref 30.0–36.0)
MCV: 108 fL — ABNORMAL HIGH (ref 80.0–100.0)
Platelets: 145 10*3/uL — ABNORMAL LOW (ref 150–400)
RBC: 3.24 MIL/uL — ABNORMAL LOW (ref 3.87–5.11)
RDW: 13.8 % (ref 11.5–15.5)
WBC: 8.4 10*3/uL (ref 4.0–10.5)
nRBC: 0 % (ref 0.0–0.2)

## 2021-11-13 LAB — BASIC METABOLIC PANEL
Anion gap: 5 (ref 5–15)
BUN: 17 mg/dL (ref 8–23)
CO2: 26 mmol/L (ref 22–32)
Calcium: 9.1 mg/dL (ref 8.9–10.3)
Chloride: 108 mmol/L (ref 98–111)
Creatinine, Ser: 0.6 mg/dL (ref 0.44–1.00)
GFR, Estimated: >60 mL/min (ref >60)
Glucose, Bld: 124 mg/dL — ABNORMAL HIGH (ref 70–99)
Potassium: 4.6 mmol/L (ref 3.5–5.1)
Sodium: 139 mmol/L (ref 135–145)

## 2021-11-13 LAB — VITAMIN D 25 HYDROXY (VIT D DEFICIENCY, FRACTURES): Vit D, 25-Hydroxy: 45.39 ng/mL (ref 30–100)

## 2021-11-13 MED ORDER — POLYETHYLENE GLYCOL 3350 17 G PO PACK
17.0000 g | PACK | Freq: Every day | ORAL | Status: DC
  Administered 2021-11-14 – 2021-11-16 (×3): 17 g via ORAL
  Filled 2021-11-13 (×3): qty 1

## 2021-11-13 MED ORDER — ACETAMINOPHEN 500 MG PO TABS
1000.0000 mg | ORAL_TABLET | Freq: Three times a day (TID) | ORAL | Status: DC
  Administered 2021-11-13 – 2021-11-16 (×9): 1000 mg via ORAL
  Filled 2021-11-13 (×9): qty 2

## 2021-11-13 NOTE — NC FL2 (Signed)
?[place] MEDICAID FL2 LEVEL OF CARE SCREENING TOOL  ?  ? ?IDENTIFICATION  ?Patient Name: ?Taylor Mccarthy Birthdate: 02/09/1941 Sex: female Admission Date (Current Location): ?11/12/2021  ?[place] and [place] Number: ? [place] and Address:  ?[facility] [address] [place] ?     Provider Number: ?[id number]  ?Attending Physician Name and Address:  ?Barton Dubois, MD ? Relative Name and Phone Number:  ?Angus Palms (Daughter)   [phone] ?   ?Current Level of Care: ?Hospital Recommended Level of Care: ?[place] Prior Approval Number: ?  ? ?Date Approved/Denied: ?  PASRR Number: ?[id number] A ? ?Discharge Plan: ?SNF ?  ? ?Current Diagnoses: ?Patient Active Problem List  ? Diagnosis Date Noted  ? Pelvic fracture ([place]) 11/12/2021  ? Overactive bladder 06/27/2021  ? Osteopenia 05/25/2021  ? Abnormal thyroid blood test 09/11/2019  ? Macrocytosis 09/11/2019  ? Depression 05/07/2019  ? Abnormal gait 05/07/2019  ? Urinary incontinence 05/07/2019  ? Persistent atrial fibrillation ([place]) 05/28/2018  ? History of colon cancer 05/28/2018  ? Dizziness 05/28/2018  ? DDD (degenerative disc disease), lumbar 05/28/2018  ? Hypothyroidism 05/28/2018  ? Cough 05/28/2018  ? Vitamin D deficiency 05/28/2018  ? Insomnia 05/28/2018  ? Lesion of oral mucosa 05/28/2018  ? Valvular heart disease 05/21/2018  ? Allergies 05/21/2018  ? ? ?Orientation RESPIRATION BLADDER Height & Weight   ?  ?Self, Situation, Time, Place ? Normal Continent Weight: 164 lb 10.9 oz (74.7 kg) ?Height:  '5\' 6"'$  (167.6 cm)  ?BEHAVIORAL SYMPTOMS/MOOD NEUROLOGICAL BOWEL NUTRITION STATUS  ?    Continent Diet (see d/c summary)  ?AMBULATORY STATUS COMMUNICATION OF NEEDS Skin   ?Extensive Assist Verbally Normal ?  ?  ?  ?    ?     ?     ? ? ?Personal Care Assistance Level of Assistance  ?Bathing, Feeding, Dressing Bathing Assistance: Maximum assistance ?Feeding assistance: Independent ?Dressing Assistance: Maximum  assistance ?   ? ?Functional Limitations Info  ?Sight, Hearing, Speech Sight Info: Adequate ?Hearing Info: Adequate ?Speech Info: Adequate  ? ? ?SPECIAL CARE FACTORS FREQUENCY  ?PT (By licensed PT), OT (By licensed OT)   ?  ?PT Frequency: 5 times weekly ?OT Frequency: 5 times weekly ?  ?  ?  ?   ? ? ?Contractures Contractures Info: Not present  ? ? ?Additional Factors Info  ?Code Status, Allergies Code Status Info: FULL ?Allergies Info: Morpholine Salicylate, Augmentin (Amoxicillin-pot Clavulanate), Codeine, Other- tree pollen ?  ?  ?  ?   ? ?Current Medications (11/13/2021):  This is the current hospital active medication list ?Current Facility-Administered Medications  ?Medication Dose Route Frequency Provider Last Rate Last Admin  ? acetaminophen (TYLENOL) tablet 650 mg  650 mg Oral Q6H PRN Barton Dubois, MD      ? Or  ? acetaminophen (TYLENOL) suppository 650 mg  650 mg Rectal Q6H PRN Barton Dubois, MD      ? calcium-vitamin D Darron Doom WITH D) 500-5 MG-MCG per tablet 1 tablet  1 tablet Oral Daily Barton Dubois, MD   1 tablet at 11/13/21 0831  ? HYDROmorphone (DILAUDID) injection 0.5-1 mg  0.5-1 mg Intravenous Q3H PRN Barton Dubois, MD   1 mg at 11/13/21 1129  ? levothyroxine (SYNTHROID) tablet 75 mcg  75 mcg Oral QAC breakfast Barton Dubois, MD   75 mcg at 11/13/21 6712  ? methocarbamol (ROBAXIN) 500 mg in dextrose 5 % 50 mL IVPB  500 mg Intravenous Q8H PRN Barton Dubois, MD      ?  metoprolol succinate (TOPROL-XL) 24 hr tablet 50 mg  50 mg Oral Daily Barton Dubois, MD   50 mg at 11/13/21 0830  ? multivitamin with minerals tablet 1 tablet  1 tablet Oral Daily Barton Dubois, MD   1 tablet at 11/13/21 0831  ? ondansetron (ZOFRAN) tablet 4 mg  4 mg Oral Q6H PRN Barton Dubois, MD      ? Or  ? ondansetron (ZOFRAN) injection 4 mg  4 mg Intravenous Q6H PRN Barton Dubois, MD      ? oxyCODONE (Oxy IR/ROXICODONE) immediate release tablet 5 mg  5 mg Oral Q6H PRN Barton Dubois, MD   5 mg at 11/13/21 0454  ?  rivaroxaban (XARELTO) tablet 20 mg  20 mg Oral q AM Barton Dubois, MD   20 mg at 11/13/21 6063  ? sertraline (ZOLOFT) tablet 50 mg  50 mg Oral Daily Barton Dubois, MD   50 mg at 11/13/21 0160  ? ? ? ?Discharge Medications: ?Please see discharge summary for a list of discharge medications. ? ?Relevant Imaging Results: ? ?Relevant Lab Results: ? ? ?Additional Information ?SSN: [id number] ? ?Iona Beard, LCSWA ? ? ? ? ?

## 2021-11-13 NOTE — Plan of Care (Signed)
?  Problem: Acute Rehab PT Goals(only PT should resolve) ?Goal: Pt will Roll Supine to Side ?Outcome: Progressing ?Flowsheets (Taken 11/13/2021 1030) ?Pt will Roll Supine to Side: with mod assist ?Goal: Pt Will Go Supine/Side To Sit ?Outcome: Progressing ?Flowsheets (Taken 11/13/2021 1030) ?Pt will go Supine/Side to Sit: with moderate assist ?Goal: Pt Will Go Sit To Supine/Side ?Outcome: Progressing ?Flowsheets (Taken 11/13/2021 1030) ?Pt will go Sit to Supine/Side: with moderate assist ? 10:32 AM, 11/13/21 ?Mearl Latin PT, DPT ?Physical Therapist at [facility] ?[facility] ? ?

## 2021-11-13 NOTE — Evaluation (Signed)
Physical Therapy Evaluation ?Patient Details ?Name: Taylor Mccarthy ?MRN: [id number] ?DOB: [birth year] ?Today's Date: 11/13/2021 ? ?History of Present Illness ? Taylor Mccarthy is a 81 y.o. female with medical history significant of persistent atrial fibrillation on chronic Xarelto, vitamin D deficiency, history of colon cancer s/p resection 1999, depression and hypothyroidism; who presented to the emergency department after mechanical fall at home and inability to get up.  Patient reports tripping and landing on her right side with inability to stand or independently walk after the fall.  Patient expressed having to stay multiple hours on the floor after she finally was able to scoop and reached the phone to request assistance.  Patient denies dysuria, hematuria, melena, hematochezia, chest pain, shortness of breath, loss of consciousness, focal weakness, headaches or any other complaints. ?  ?Clinical Impression ? Patient limited for functional mobility as stated below secondary to BLE weakness, fatigue and pain. Multiple attempts made for mobility but patient limited by pain. Patient with minimal LE AROM due to c/o pain. Attempted supine/side lying to sit and rolling but patient does not tolerate well. Patient educated on importance of movement to regain mobility and changing positions to avoid skin break down. Patient able to pull to adjust trunk position slightly at end of session.  Patient will benefit from continued physical therapy in hospital and recommended venue below to increase strength, balance, endurance for safe ADLs and gait. ?   ?   ? ?Recommendations for follow up therapy are one component of a multi-disciplinary discharge planning process, led by the attending physician.  Recommendations may be updated based on patient status, additional functional criteria and insurance authorization. ? ?Follow Up Recommendations Skilled nursing-short term rehab (<3 hours/day) ? ?  ?Assistance Recommended at Discharge  Intermittent Supervision/Assistance  ?Patient can return home with the following ? A lot of help with walking and/or transfers;A lot of help with bathing/dressing/bathroom;Assistance with cooking/housework;Help with stairs or ramp for entrance;Assist for transportation ? ?  ?Equipment Recommendations None recommended by PT  ?Recommendations for Other Services ?    ?  ?Functional Status Assessment Patient has had a recent decline in their functional status and demonstrates the ability to make significant improvements in function in a reasonable and predictable amount of time.  ? ?  ?Precautions / Restrictions Precautions ?Precautions: Fall ?Restrictions ?Weight Bearing Restrictions: Yes ?RLE Weight Bearing: Weight bearing as tolerated  ? ?  ? ?Mobility ? Bed Mobility ?Overal bed mobility: Needs Assistance ?Bed Mobility: Rolling, Supine to Sit ?Rolling: Max assist ?  ?Supine to sit: Max assist ?  ?  ?General bed mobility comments: attempted rolling and transition to seated but patient unable to tolerate movement due to c/o pain, able to pull to reposition UE  in bed slightly ?  ? ?Transfers ?  ?  ?  ?  ?  ?  ?  ?  ?  ?  ?  ? ?Ambulation/Gait ?  ?  ?  ?  ?  ?  ?  ?  ? ?Stairs ?  ?  ?  ?  ?  ? ?Wheelchair Mobility ?  ? ?Modified Rankin (Stroke Patients Only) ?  ? ?  ? ?Balance   ?  ?  ?  ?  ?  ?  ?  ?  ?  ?  ?  ?  ?  ?  ?  ?  ?  ?  ?   ? ? ? ?Pertinent Vitals/Pain Pain Assessment ?Pain Assessment: Faces ?Faces Pain Scale: Hurts  even more ?Pain Location: R hip/pelvis with movement ?Pain Descriptors / Indicators: Sharp ?Pain Intervention(s): Limited activity within patient's tolerance, Monitored during session, Premedicated before session, Repositioned  ? ? ?Home Living Family/patient expects to be discharged to:: Private residence ?Living Arrangements: Alone ?  ?Type of Home: House ?Home Access: Stairs to enter ?Entrance Stairs-Rails: Right ?Entrance Stairs-Number of Steps: 2-3 ?  ?Home Layout: Two level;Able to live on  main level with bedroom/bathroom ?Home Equipment: Kasandra Knudsen - single point ?   ?  ?Prior Function Prior Level of Function : Independent/Modified Independent ?  ?  ?  ?  ?  ?  ?Mobility Comments: short distance community ambulation with SPC ?ADLs Comments: independent ?  ? ? ?Hand Dominance  ?   ? ?  ?Extremity/Trunk Assessment  ? Upper Extremity Assessment ?Upper Extremity Assessment: Defer to OT evaluation ?  ? ?Lower Extremity Assessment ?Lower Extremity Assessment: RLE deficits/detail ?RLE: Unable to fully assess due to pain ?  ? ?   ?Communication  ? Communication: No difficulties  ?Cognition Arousal/Alertness: Awake/alert ?Behavior During Therapy: [facility] for tasks assessed/performed ?Overall Cognitive Status: Within Functional Limits for tasks assessed ?  ?  ?  ?  ?  ?  ?  ?  ?  ?  ?  ?  ?  ?  ?  ?  ?  ?  ?  ? ?  ?General Comments   ? ?  ?Exercises    ? ?Assessment/Plan  ?  ?PT Assessment Patient needs continued PT services  ?PT Problem List Decreased strength;Decreased mobility;Decreased range of motion;Decreased activity tolerance;Decreased balance;Pain ? ?   ?  ?PT Treatment Interventions DME instruction;Therapeutic exercise;Gait training;Balance training;Stair training;Neuromuscular re-education;Functional mobility training;Therapeutic activities;Patient/family education   ? ?PT Goals (Current goals can be found in the Care Plan section)  ?Acute Rehab PT Goals ?Patient Stated Goal: return home ?PT Goal Formulation: With patient ?Time For Goal Achievement: 11/27/21 ?Potential to Achieve Goals: Fair ? ?  ?Frequency Min 3X/week ?  ? ? ?Co-evaluation   ?  ?  ?  ?  ? ? ?  ?AM-PAC PT "6 Clicks" Mobility  ?Outcome Measure Help needed turning from your back to your side while in a flat bed without using bedrails?: A Lot ?Help needed moving from lying on your back to sitting on the side of a flat bed without using bedrails?: A Lot ?Help needed moving to and from a bed to a chair (including a wheelchair)?: Total ?Help needed  standing up from a chair using your arms (e.g., wheelchair or bedside chair)?: Total ?Help needed to walk in hospital room?: Total ?Help needed climbing 3-5 steps with a railing? : Total ?6 Click Score: 8 ? ?  ?End of Session   ?Activity Tolerance: Patient limited by pain ?Patient left: in bed;with call bell/phone within reach ?Nurse Communication: Mobility status ?PT Visit Diagnosis: Unsteadiness on feet (R26.81);Other abnormalities of gait and mobility (R26.89);Muscle weakness (generalized) (M62.81);Pain ?Pain - Right/Left: Right ?Pain - part of body: Hip ?  ? ?Time: 6440-3474 ?PT Time Calculation (min) (ACUTE ONLY): 21 min ? ? ?Charges:   PT Evaluation ?$PT Eval Low Complexity: 1 Low ?PT Treatments ?$Therapeutic Activity: 8-22 mins ?  ?   ? ? ?10:29 AM, 11/13/21 ?Mearl Latin PT, DPT ?Physical Therapist at [facility] ?[facility] ? ? ?

## 2021-11-13 NOTE — Assessment & Plan Note (Addendum)
-  Mild in the setting of prolonged immobility after mechanical fall. ?-Continue to follow CK levels ?-Advised to maintain adequate hydration. ?-At time of admission CK level 843; most recent level in the 400 range.Marland Kitchen ?

## 2021-11-13 NOTE — Care Management Obs Status (Signed)
MEDICARE OBSERVATION STATUS NOTIFICATION ? ? ?Patient Details  ?Name: Taylor Mccarthy ?MRN: [id number] ?Date of Birth: [birth year] ? ? ?Medicare Observation Status Notification Given:  Yes ? ? ? ?Iona Beard, LCSWA ?11/13/2021, 8:47 AM ?

## 2021-11-13 NOTE — TOC Initial Note (Addendum)
Transition of Care (TOC) - Initial/Assessment Note  ? ? ?Patient Details  ?Name: Taylor Mccarthy ?MRN: [id number] ?Date of Birth: 1940-09-17 ? ?Transition of Care (TOC) CM/SW Contact:    ?Iona Beard, LCSWA ?Phone Number: ?11/13/2021, 12:02 PM ? ?Clinical Narrative:                 ?TOC updated that PT is recommending SNF at D/C for pt. CSW spoke with pt in room about this recommendation. Pt is agreeable to a SNF referral at this time. CSW sent referral to facilities in the community. CSW completed PASRR and Fl2. CSW attempted to start insurance auth, however pt is not [facility] managed so Josem Kaufmann will need to be started by selected facility. TOC to follow.  ? ?Expected Discharge Plan: [place] ?Barriers to Discharge: Continued Medical Work up ? ? ?Patient Goals and CMS Choice ?Patient states their goals for this hospitalization and ongoing recovery are:: go to SNF ?CMS Medicare.gov Compare Post Acute Care list provided to:: Patient ?Choice offered to / list presented to : Patient ? ?Expected Discharge Plan and Services ?Expected Discharge Plan: [facility] ?In-house Referral: Clinical Social Work ?  ?Post Acute Care Choice: [place] ?Living arrangements for the past 2 months: [place] ?                ?  ?  ?  ?  ?  ?  ?  ?  ?  ?  ? ?Prior Living Arrangements/Services ?Living arrangements for the past 2 months: [place] ?Lives with:: Self ?Patient language and need for interpreter reviewed:: Yes ?Do you feel safe going back to the place where you live?: Yes      ?Need for Family Participation in Patient Care: Yes (Comment) ?Care giver support system in place?: Yes (comment) ?  ?Criminal Activity/Legal Involvement Pertinent to Current Situation/Hospitalization: No - Comment as needed ? ?Activities of Daily Living ?Home Assistive Devices/Equipment: Cane (specify quad or straight) Corporate investment banker) ?ADL Screening (condition at time of admission) ?Patient's cognitive  ability adequate to safely complete daily activities?: Yes ?Is the patient deaf or have difficulty hearing?: No ?Does the patient have difficulty seeing, even when wearing glasses/contacts?: No ?Does the patient have difficulty concentrating, remembering, or making decisions?: Yes ?Patient able to express need for assistance with ADLs?: Yes ?Does the patient have difficulty dressing or bathing?: Yes ?Independently performs ADLs?: No ?Does the patient have difficulty walking or climbing stairs?: Yes ?Weakness of Legs: Both ?Weakness of Arms/Hands: Both ? ?Permission Sought/Granted ?  ?  ?   ?   ?   ?   ? ?Emotional Assessment ?Appearance:: Appears stated age ?Attitude/Demeanor/Rapport: Engaged ?Affect (typically observed): Accepting ?Orientation: : Oriented to Self, Oriented to Place, Oriented to  Time, Oriented to Situation ?Alcohol / Substance Use: Not Applicable ?Psych Involvement: No (comment) ? ?Admission diagnosis:  Pelvic fracture ([place]) [S32.9XXA] ?Pelvic hematoma, female [N94.89] ?Fall, initial encounter [W19.XXXA] ?On rivaroxaban therapy [Z79.01] ?Longstanding persistent atrial fibrillation ([place]) [I48.11] ?Closed fracture of sacrum, unspecified fracture morphology, initial encounter ([place]) [S32.10XA] ?Multiple closed fractures of pelvis with stable disruption of pelvic ring, initial encounter ([place]) [S32.810A] ?Patient Active Problem List  ? Diagnosis Date Noted  ? Pelvic fracture ([place]) 11/12/2021  ? Overactive bladder 06/27/2021  ? Osteopenia 05/25/2021  ? Abnormal thyroid blood test 09/11/2019  ? Macrocytosis 09/11/2019  ? Depression 05/07/2019  ? Abnormal gait 05/07/2019  ? Urinary incontinence 05/07/2019  ? Persistent atrial fibrillation ([place]) 05/28/2018  ?  History of colon cancer 05/28/2018  ? Dizziness 05/28/2018  ? DDD (degenerative disc disease), lumbar 05/28/2018  ? Hypothyroidism 05/28/2018  ? Cough 05/28/2018  ? Vitamin D deficiency 05/28/2018  ? Insomnia 05/28/2018  ? Lesion of oral mucosa  05/28/2018  ? Valvular heart disease 05/21/2018  ? Allergies 05/21/2018  ? ?PCP:  McLean-Scocuzza, Nino Glow, MD ?Pharmacy:   ?[facility] [address] ?[address] ?[place] [address] ?Phone: [phone] Fax: [phone] ? ?[id number] [place] [address]?Phone: [phone]: [phone]? ?[place]?[id number]?Phone: [phone]: [phone]? ? ? ? ?Social Determinants of Health (SDOH) Interventions ?  ? ?Readmission Risk Interventions ?   ? View : No data to display.  ?  ?  ?  ? ? ? ?

## 2021-11-13 NOTE — Progress Notes (Signed)
?Progress Note ? ? ?Patient: Taylor Mccarthy:[id number] DOB: 06/14/1941 DOA: 11/12/2021     0 ?DOS: the patient was seen and examined on 11/13/2021 ?  ?Brief hospital course: ?Taylor Mccarthy is a 81 y.o. female with medical history significant of persistent atrial fibrillation on chronic Xarelto, vitamin D deficiency, history of colon cancer s/p resection 1999, depression and hypothyroidism; who presented to the emergency department after mechanical fall at home and inability to get up.  Patient reports tripping and landing on her right side with inability to stand or independently walk after the fall.  Patient expressed having to stay multiple hours on the floor after she finally was able to scoop and reached the phone to request assistance.  Patient denies dysuria, hematuria, melena, hematochezia, chest pain, shortness of breath, loss of consciousness, focal weakness, headaches or any other complaints. ?Of note, patient reports hitting her head in the way down after falling but never lost consciousness as mentioned above. ?  ?Vaccinated and boosted against COVID. ?  ?Work-up in the ED demonstrating pelvic fracture affecting superior and inferior pubic rami; there is also sacral fracture and surrounding hematoma.  Case was discussed with orthopedic service who did not feel the need for surgical intervention and recommended admission for pain management and physical therapy evaluation. ?  ?Patient lives alone and was able to perform all her activities of daily living independently prior to this admission.  Hemodynamically stable. ? ?Assessment and Plan: ?* Pelvic fracture ([place]) ?- Images demonstrating superior and inferior Pubic rami fracture of the right hip; and there is also right sacral fracture appreciated on CT scan. ?-There is a moderate extraperitoneal hemorrhage and hematoma in the pelvis with also a small presacral hematoma.  No evacuation needed. ?-Case discussed with orthopedic surgery who felt no need for  surgical intervention required. ?-Continue analgesic therapy and after evaluation by physical therapy will require placement to skilled nursing facility for rehabilitation and further care. ?-Prior to admission patient was completely independent on her activities of daily living and living alone. ? ?Rhabdomyolysis ?- Mild in the setting of prolonged immobility after mechanical fall. ?-Continue to follow CK levels ?-Advised to maintain adequate hydration. ?-At time of admission CK level 843. ? ?Depression ?-Stable mood overall ?-Continue the use of sertraline. ? ?Vitamin D deficiency ?-Vitamin D 45.39 ?-Continue the use of Oscal ? ?Hypothyroidism ?-Continue Synthroid ? ?Persistent atrial fibrillation ([place]) ?-Rate controlled ?-Continue the use of metoprolol and Xarelto for secondary prevention. ?-Hemoglobin levels has remained stable; patient is hemodynamically stable. ? ? ? ?Subjective:  ?Afebrile, no chest pain, no nausea, no vomiting.  Good saturation on room air.  Complaining of significant right hip pain having difficulty bearing any weights, and even repositioning in bed. ? ?Physical Exam: ?Vitals:  ? 11/12/21 2101 11/13/21 0206 11/13/21 0525 11/13/21 1450  ?BP: (!) 95/52 105/68 121/76 101/63  ?Pulse: 92 88 86 81  ?Resp: '20 19 19 18  '$ ?Temp: 98 ?F (36.7 ?C) 98 ?F (36.7 ?C) 98 ?F (36.7 ?C) 98.5 ?F (36.9 ?C)  ?TempSrc: Oral   Oral  ?SpO2: 90% 90% 90% 90%  ?Weight:      ?Height:      ? ?General exam: Alert, awake, oriented x 3; no chest pain, no nausea, no vomiting.  Complaining of significant pain in her right hip and inability to bear weight or even move on bed. ?Respiratory system: Clear to auscultation. Respiratory effort normal.  Good saturation on room air. ?Cardiovascular system:Rate controlled; no rubs or gallops. ?Gastrointestinal system:  Abdomen is nondistended, soft and nontender. No organomegaly or masses felt. Normal bowel sounds heard. ?Central nervous system: Alert and oriented. No focal neurological  deficits. ?Extremities: No C/C/E, +pedal pulses ?Skin: No rashes, lesions or ulcers ?Psychiatry: Judgement and insight appear normal. Mood & affect appropriate.  ? ?Data Reviewed: ?Basic metabolic panel with sodium of 139, potassium 4.6, BUN 17, creatinine 0.60. ?CBC with a WBC of 8.4, hemoglobin 11.4, platelet count 145k ? ?Family Communication: No family at bedside. ? ?Disposition: ?Status is: Inpatient ?Remains inpatient appropriate because: Still requiring analgesic management and will need skilled nursing facility placement. ? ? Planned Discharge Destination: Skilled nursing facility ? ?Author: ?Barton Dubois, MD ?11/13/2021 5:43 PM ? ?For on call review [email or web address].  ?

## 2021-11-14 DIAGNOSIS — F32A Depression, unspecified: Secondary | ICD-10-CM | POA: Diagnosis not present

## 2021-11-14 DIAGNOSIS — E039 Hypothyroidism, unspecified: Secondary | ICD-10-CM | POA: Diagnosis not present

## 2021-11-14 DIAGNOSIS — S3210XA Unspecified fracture of sacrum, initial encounter for closed fracture: Secondary | ICD-10-CM | POA: Diagnosis not present

## 2021-11-14 DIAGNOSIS — I4811 Longstanding persistent atrial fibrillation: Secondary | ICD-10-CM | POA: Diagnosis not present

## 2021-11-14 LAB — CK: Total CK: 414 U/L — ABNORMAL HIGH (ref 38–234)

## 2021-11-14 MED ORDER — ENSURE ENLIVE PO LIQD
237.0000 mL | Freq: Two times a day (BID) | ORAL | Status: DC
  Administered 2021-11-15 – 2021-11-16 (×3): 237 mL via ORAL

## 2021-11-14 NOTE — Progress Notes (Signed)
?Progress Note ? ? ?Patient: Taylor Mccarthy GYB:[id number] DOB: 1940-12-31 DOA: 11/12/2021     1 ?DOS: the patient was seen and examined on 11/14/2021 ?  ?Brief hospital course: ?Lashai Grosch is a 81 y.o. female with medical history significant of persistent atrial fibrillation on chronic Xarelto, vitamin D deficiency, history of colon cancer s/p resection 1999, depression and hypothyroidism; who presented to the emergency department after mechanical fall at home and inability to get up.  Patient reports tripping and landing on her right side with inability to stand or independently walk after the fall.  Patient expressed having to stay multiple hours on the floor after she finally was able to scoop and reached the phone to request assistance.  Patient denies dysuria, hematuria, melena, hematochezia, chest pain, shortness of breath, loss of consciousness, focal weakness, headaches or any other complaints. ?Of note, patient reports hitting her head in the way down after falling but never lost consciousness as mentioned above. ?  ?Vaccinated and boosted against COVID. ?  ?Work-up in the ED demonstrating pelvic fracture affecting superior and inferior pubic rami; there is also sacral fracture and surrounding hematoma.  Case was discussed with orthopedic service who did not feel the need for surgical intervention and recommended admission for pain management and physical therapy evaluation. ?  ?Patient lives alone and was able to perform all her activities of daily living independently prior to this admission.  Hemodynamically stable. ? ?Assessment and Plan: ?* Pelvic fracture ([place]) ?- Images demonstrating superior and inferior Pubic rami fracture of the right hip; and there is also right sacral fracture appreciated on CT scan. ?-There is a moderate extraperitoneal hemorrhage and hematoma in the pelvis with also a small presacral hematoma.  No evacuation needed. ?-Case discussed with orthopedic surgery who felt no need for  surgical intervention required. ?-Continue analgesic therapy and after evaluation by physical therapy will require placement to skilled nursing facility for rehabilitation and further care. ?-Prior to admission patient was completely independent on her activities of daily living and living alone. ? ?Rhabdomyolysis ?- Mild in the setting of prolonged immobility after mechanical fall. ?-Continue to follow CK levels ?-Advised to maintain adequate hydration. ?-At time of admission CK level 843. ? ?Depression ?-Stable mood overall ?-Continue the use of sertraline. ? ?Vitamin D deficiency ?-Vitamin D 45.39 ?-Continue the use of Oscal ? ?Hypothyroidism ?-Continue Synthroid ? ?Persistent atrial fibrillation ([place]) ?-Rate controlled ?-Continue the use of metoprolol and Xarelto for secondary prevention. ?-Hemoglobin levels has remained stable; patient is hemodynamically stable. ? ? ? ?Subjective:  ?No fever, no chest pain, no nausea, no vomiting, no palpitations.  Good saturation on room air.  Still complaining of significant pain and difficulty bearing weight; but overall better around analgesics therapy. ? ?Physical Exam: ?Vitals:  ? 11/13/21 1450 11/13/21 2003 11/14/21 0605 11/14/21 1233  ?BP: 101/63 116/73 123/76 114/70  ?Pulse: 81 82 88 84  ?Resp: '18 17 17 17  '$ ?Temp: 98.5 ?F (36.9 ?C) 98.3 ?F (36.8 ?C) 98.7 ?F (37.1 ?C) 98.2 ?F (36.8 ?C)  ?TempSrc: Oral Oral    ?SpO2: 90% 95% 96% 97%  ?Weight:      ?Height:      ? ?General exam: Alert, awake, oriented x 3; still complaining of pain but is slightly more control around analgesics.  No fever, no chest pain, no nausea, no vomiting, no palpitations. ?Respiratory system: Clear to auscultation. Respiratory effort normal.  Good saturation on room air.  No using accessory muscle. ?Cardiovascular system: Rate controlled, no rubs,  no gallops, positive murmur; no JVD. ?Gastrointestinal system: Abdomen is nondistended, soft and nontender. No organomegaly or masses felt. Normal bowel  sounds heard. ?Central nervous system: Alert and oriented. No focal neurological deficits. ?Extremities: No cyanosis or clubbing. ?Skin: No r petechiae. ?Psychiatry: Judgement and insight appear normal. Mood & affect appropriate.  ? ?Data Reviewed: ?CK level 414 ? ?Family Communication: No family at bedside. ? ?Disposition: ?Status is: Inpatient ?Remains inpatient appropriate because: Still requiring analgesic management and will need skilled nursing facility placement. ? ? Planned Discharge Destination: Skilled nursing facility ? ?Author: ?Barton Dubois, MD ?11/14/2021 6:49 PM ? ?For on call review [email or web address].  ?

## 2021-11-14 NOTE — TOC Progression Note (Signed)
Transition of Care (TOC) - Progression Note  ? ? ?Patient Details  ?Name: Taylor Mccarthy ?MRN: [id number] ?Date of Birth: February 15, 1941 ? ?Transition of Care (TOC) CM/SW Contact  ?Salome Arnt, LCSW ?Phone Number: ?11/14/2021, 2:44 PM ? ?Clinical Narrative:  Pt accepted bed at [facility]. Facility started authorization this morning. TOC will continue to follow.   ? ? ? ?Expected Discharge Plan: [place] ?Barriers to Discharge: Insurance Authorization ? ?Expected Discharge Plan and Services ?Expected Discharge Plan: [place] ?In-house Referral: Clinical Social Work ?  ?Post Acute Care Choice: [place] ?Living arrangements for the past 2 months: [place] ?                ?  ?  ?  ?  ?  ?  ?  ?  ?  ?  ? ? ?Social Determinants of Health (SDOH) Interventions ?  ? ?Readmission Risk Interventions ?   ? View : No data to display.  ?  ?  ?  ? ? ?

## 2021-11-14 NOTE — Plan of Care (Signed)
?  Problem: Acute Rehab OT Goals (only OT should resolve) ?Goal: Pt. Will Perform Grooming ?Flowsheets (Taken 11/14/2021 1007) ?Pt Will Perform Grooming: ? with modified independence ? sitting ?Goal: Pt. Will Perform Upper Body Bathing ?Flowsheets (Taken 11/14/2021 1007) ?Pt Will Perform Upper Body Bathing: ? with set-up ? sitting ?Goal: Pt. Will Perform Lower Body Bathing ?Flowsheets (Taken 11/14/2021 1007) ?Pt Will Perform Lower Body Bathing: ? with mod assist ? bed level ?Goal: Pt. Will Perform Lower Body Dressing ?Flowsheets (Taken 11/14/2021 1007) ?Pt Will Perform Lower Body Dressing: ? with mod assist ? bed level ?Goal: Pt. Will Transfer To Toilet ?Flowsheets (Taken 11/14/2021 1007) ?Pt Will Transfer to Toilet: ? with mod assist ? stand pivot transfer ? bedside commode ?Goal: Pt. Will Perform Toileting-Clothing Manipulation ?Flowsheets (Taken 11/14/2021 1007) ?Pt Will Perform Toileting - Clothing Manipulation and hygiene: ? with max assist ? sitting/lateral leans ? sit to/from stand ?  ?

## 2021-11-14 NOTE — Evaluation (Signed)
Occupational Therapy Evaluation ?Patient Details ?Name: Taylor Mccarthy ?MRN: [id number] ?DOB: 1941/05/02 ?Today's Date: 11/14/2021 ? ? ?History of Present Illness Taylor Mccarthy is a 81 y.o. female with medical history significant of persistent atrial fibrillation on chronic Xarelto, vitamin D deficiency, history of colon cancer s/p resection 1999, depression and hypothyroidism; who presented to the emergency department after mechanical fall at home and inability to get up.  Patient reports tripping and landing on her right side with inability to stand or independently walk after the fall.  Patient expressed having to stay multiple hours on the floor after she finally was able to scoop and reached the phone to request assistance.  Patient denies dysuria, hematuria, melena, hematochezia, chest pain, shortness of breath, loss of consciousness, focal weakness, headaches or any other complaints.  ? ?Clinical Impression ?  ?Pt in bed upon therapy arrival and agreeable to participate in OT evaluation. Initially, reports no pain while in bed although once attempting to bring the head of bed down, she verbalized pain. Required an extensive amount of time to transition from semi-reclined in bed to seated on EOB. Pt able to assist with moving her BLEs very little. Pt was provided with compensatory breathing techniques to work through the pain. Discussed techniques for weightbearing, attempting to complete a sit to stand transition, and when completing bed mobility. Pain level dictates what she is able to tolerate at this time. She declined to transfer into recliner once she was seated on EOB. Pt was assisted back to bed with increased time needed. Due to pain level, patient requires increased assistance to complete all basic ADL tasks and will benefit from SNF upon discharge to increase her functional performance and ability to complete functional transfer. OT will follow patient acutely.   ?   ? ?Recommendations for follow up  therapy are one component of a multi-disciplinary discharge planning process, led by the attending physician.  Recommendations may be updated based on patient status, additional functional criteria and insurance authorization.  ? ?Follow Up Recommendations ? Skilled nursing-short term rehab (<3 hours/day)  ?  ?Assistance Recommended at Discharge Frequent or constant Supervision/Assistance  ?Patient can return home with the following A lot of help with walking and/or transfers;A lot of help with bathing/dressing/bathroom;Assistance with cooking/housework;Assist for transportation;Help with stairs or ramp for entrance ? ?  ?Functional Status Assessment ? Patient has had a recent decline in their functional status and demonstrates the ability to make significant improvements in function in a reasonable and predictable amount of time.  ?Equipment Recommendations ? BSC/3in1;Tub/shower seat  ?  ?   ?Precautions / Restrictions Precautions ?Precautions: Fall ?Restrictions ?Weight Bearing Restrictions: Yes ?RLE Weight Bearing: Weight bearing as tolerated  ? ?  ? ?Mobility Bed Mobility ?Overal bed mobility: Needs Assistance ?Bed Mobility: Sit to Sidelying, Rolling ?Rolling: Max assist ?  ?Supine to sit: HOB elevated, Max assist ?  ?Sit to sidelying: Max assist ?General bed mobility comments: Slow and labored movement, increased time needed to complete with reassurance. Unable to move RLE with reports of pain. Total assist provided to move RLE to EOB. ?Patient Response: Anxious ? ?Transfers ?  ?  ?  ?General transfer comment: Declined transfer. Attempted. ?  ? ?  ?Balance Overall balance assessment: Needs assistance ?Sitting-balance support: Bilateral upper extremity supported, Feet unsupported (left leg with semi support provided.) ?Sitting balance-Leahy Scale: Fair ?Sitting balance - Comments: sitting on EOB ?   ? ?ADL either performed or assessed with clinical judgement  ? ?ADL Overall ADL's :  Needs  assistance/impaired ?Eating/Feeding: Modified independent;Bed level ?  ?Grooming: Wash/dry face;Brushing hair;Set up;Bed level ?  ?Upper Body Bathing: Minimal assistance;Bed level ?  ?Lower Body Bathing: Total assistance;Bed level ?  ?Upper Body Dressing : Minimal assistance;Sitting ?  ?Lower Body Dressing: Total assistance;Bed level ?  ?  ?Toilet Transfer Details (indicate cue type and reason): Not completed during evaluation. Pt declined. ?  ?  ?  ?  ?Functional mobility during ADLs: Maximal assistance ?General ADL Comments: Just in terms of bed mobility.  ? ? ? ?Vision Baseline Vision/History: 0 No visual deficits ?Patient Visual Report: No change from baseline ?   ?   ?   ?   ? ?Pertinent Vitals/Pain Pain Assessment ?Pain Assessment: PAINAD ?Breathing: occasional labored breathing, short period of hyperventilation ?Negative Vocalization: occasional moan/groan, low speech, negative/disapproving quality ?Facial Expression: facial grimacing ?Body Language: tense, distressed pacing, fidgeting ?Consolability: distracted or reassured by voice/touch ?PAINAD Score: 6 ?Pain Location: R hip/pelvis with movement ?Pain Intervention(s): Monitored during session, Utilized relaxation techniques  ? ? ? ?Hand Dominance Right ?  ?Extremity/Trunk Assessment Upper Extremity Assessment ?Upper Extremity Assessment: Overall WFL for tasks assessed ?  ?Lower Extremity Assessment ?Lower Extremity Assessment: Defer to PT evaluation ?  ?  ?  ?Communication Communication ?Communication: No difficulties ?  ?Cognition Arousal/Alertness: Awake/alert ?Behavior During Therapy: Anxious ?Overall Cognitive Status: Within Functional Limits for tasks assessed ?  ?  ?  ?   ?   ?   ? ? ?Home Living Family/patient expects to be discharged to:: Private residence ?Living Arrangements: Alone ?  ?Type of Home: House ?Home Access: Stairs to enter ?Entrance Stairs-Number of Steps: 2-3 ?Entrance Stairs-Rails: Right ?Home Layout: Two level;Able to live on main  level with bedroom/bathroom ?  ?  ?Bathroom Shower/Tub: Tub/shower unit ?  ?Bathroom Toilet: Standard ?  ?  ?Home Equipment: Kasandra Knudsen - single point ?  ?  ?  ? ?  ?Prior Functioning/Environment Prior Level of Function : Independent/Modified Independent ?  ?  ?  ?  ?  ?  ?Mobility Comments: short distance community ambulation with SPC ?ADLs Comments: independent ?  ? ?  ?  ?OT Problem List: Pain;Decreased activity tolerance;Decreased safety awareness;Impaired balance (sitting and/or standing);Decreased knowledge of use of DME or AE;Decreased knowledge of precautions ?  ?   ?OT Treatment/Interventions: Self-care/ADL training;Therapeutic exercise;Therapeutic activities;Neuromuscular education;DME and/or AE instruction;Patient/family education;Manual therapy;Balance training;Modalities  ?  ?OT Goals(Current goals can be found in the care plan section) Acute Rehab OT Goals ?Patient Stated Goal: none stated. ?OT Goal Formulation: With patient ?Time For Goal Achievement: 11/28/21 ?Potential to Achieve Goals: Fair  ?OT Frequency: Min 2X/week ?  ? ?   ?AM-PAC OT "6 Clicks" Daily Activity     ?Outcome Measure Help from another person eating meals?: None ?Help from another person taking care of personal grooming?: A Little ?Help from another person toileting, which includes using toliet, bedpan, or urinal?: Total ?Help from another person bathing (including washing, rinsing, drying)?: A Lot ?Help from another person to put on and taking off regular upper body clothing?: A Little ?Help from another person to put on and taking off regular lower body clothing?: Total ?6 Click Score: 14 ?  ?End of Session Equipment Utilized During Treatment: Gait belt;Rolling walker (2 wheels) ? ?Activity Tolerance: Patient limited by pain ?Patient left: in bed;with call bell/phone within reach ? ?OT Visit Diagnosis: Muscle weakness (generalized) (M62.81);History of falling (Z91.81)  ?              ?  Time: [phone] ?OT Time Calculation (min): 50  min ?Charges:  OT General Charges ?$OT Visit: 1 Visit ?OT Evaluation ?$OT Eval High Complexity: 1 High ?OT Treatments ?$Therapeutic Activity: 38-52 mins (43') ? ?Ailene Ravel, OTR/L,CBIS  ?[phone] ? ? ?Layney Gillson, Clarene Duke ?

## 2021-11-15 DIAGNOSIS — E039 Hypothyroidism, unspecified: Secondary | ICD-10-CM | POA: Diagnosis not present

## 2021-11-15 DIAGNOSIS — N9489 Other specified conditions associated with female genital organs and menstrual cycle: Secondary | ICD-10-CM

## 2021-11-15 DIAGNOSIS — S3210XA Unspecified fracture of sacrum, initial encounter for closed fracture: Secondary | ICD-10-CM

## 2021-11-15 DIAGNOSIS — I4811 Longstanding persistent atrial fibrillation: Secondary | ICD-10-CM | POA: Diagnosis not present

## 2021-11-15 DIAGNOSIS — F32A Depression, unspecified: Secondary | ICD-10-CM | POA: Diagnosis not present

## 2021-11-15 MED ORDER — FLEET ENEMA 7-19 GM/118ML RE ENEM
1.0000 | ENEMA | Freq: Once | RECTAL | Status: AC
  Administered 2021-11-15: 1 via RECTAL

## 2021-11-15 MED ORDER — ENSURE ENLIVE PO LIQD
237.0000 mL | Freq: Two times a day (BID) | ORAL | Status: DC
Start: 2021-11-15 — End: 2022-07-25

## 2021-11-15 MED ORDER — METHOCARBAMOL 500 MG PO TABS: 500.0000 mg | ORAL_TABLET | Freq: Three times a day (TID) | ORAL | 0 refills | Status: DC | PRN

## 2021-11-15 MED ORDER — ACETAMINOPHEN 500 MG PO TABS: 1000.0000 mg | ORAL_TABLET | Freq: Three times a day (TID) | ORAL | Status: DC

## 2021-11-15 MED ORDER — BISACODYL 10 MG RE SUPP
10.0000 mg | Freq: Once | RECTAL | Status: AC
  Administered 2021-11-15: 10 mg via RECTAL
  Filled 2021-11-15: qty 1

## 2021-11-15 MED ORDER — OXYCODONE HCL 5 MG PO TABS: 5.0000 mg | ORAL_TABLET | Freq: Four times a day (QID) | ORAL | 0 refills | Status: DC | PRN

## 2021-11-15 NOTE — Care Management Important Message (Signed)
Important Message ? ?Patient Details  ?Name: Taylor Mccarthy ?MRN: [id number] ?Date of Birth: Oct 08, 1940 ? ? ?Medicare Important Message Given:  N/A - LOS <3 / Initial given by admissions ? ? ? ? ?Tommy Medal ?11/15/2021, 10:38 AM ?

## 2021-11-15 NOTE — Assessment & Plan Note (Signed)
-  no surgical intervention needed ?-continue PRN analgesics and physical therapy. ?

## 2021-11-15 NOTE — TOC Transition Note (Signed)
Transition of Care (TOC) - CM/SW Discharge Note ? ? ?Patient Details  ?Name: Taylor Mccarthy ?MRN: [id number] ?Date of Birth: 1940/11/25 ? ?Transition of Care [facility]) CM/SW Contact:  ?Joaquin Courts, RN ?Phone Number: ?11/15/2021, 4:48 PM ? ? ?Clinical Narrative:    ?CM notified by bedside Rn that patient has changed her mind about discharge to [facility] rehab and would now like to go to [facility] instead. ?CM met with patient at bedside and patient confirms this decision and is adamant that she would like to go to [facility].  CM spoke with facility rep and authorization was again amended to reflect [facility] as destination and a new bed assignment was obtained, room 505-B.  Bedside RN provided with new report number, unfortunately county ems does not have convalescent availability to transport out of the county today.  CM reached out to [place], [place], and [place] for assist and none have availability today.  Call was also made to [facility] who also report no availability for transport today. ?Patient placed on ems list for transport tomorrow am to [facility]. Bedside RN and MD made aware.   ? ? ?Final next level of care: [facility] ?Barriers to Discharge: No Barriers Identified ? ? ?Patient Goals and CMS Choice ?Patient states their goals for this hospitalization and ongoing recovery are:: go to SNF ?CMS Medicare.gov Compare Post Acute Care list provided to:: Patient ?Choice offered to / list presented to : Patient ? ?Discharge Placement ?  ?           ?  ?  ?  ?  ? ?Discharge Plan and Services ?In-house Referral: Clinical Social Work ?  ?Post Acute Care Choice: [place]          ?  ?  ?  ?  ?  ?  ?  ?  ?  ?  ? ?Social Determinants of Health (SDOH) Interventions ?  ? ? ?Readmission Risk Interventions ?   ? View : No data to display.  ?  ?  ?  ? ? ? ? ? ?

## 2021-11-15 NOTE — TOC Transition Note (Signed)
Transition of Care (TOC) - CM/SW Discharge Note ? ? ?Patient Details  ?Name: Deane Melick ?MRN: [id number] ?Date of Birth: [birth year] ? ?Transition of Care [facility]) CM/SW Contact:  ?Joaquin Courts, RN ?Phone Number: ?11/15/2021, 1:04 PM ? ? ?Clinical Narrative:   ?Patient repost she would like to change her facility selection, she no longer wishes to go to [facility].  Patient selected [facility] instead.  Facility rep made aware and insurance authorization switched to reflect facility change.  Patient to discharge to [facility] today to room 303-2, DC summary sent to facility and bedside RN provided with report number.  Patient to transport with [facility], transport paperwork left at main nursing desk.  ? ? ? ?Final next level of care: [place] ?Barriers to Discharge: No Barriers Identified ? ? ?Patient Goals and CMS Choice ?Patient states their goals for this hospitalization and ongoing recovery are:: go to SNF ?CMS Medicare.gov Compare Post Acute Care list provided to:: Patient ?Choice offered to / list presented to : Patient ? ?Discharge Placement ?  ?           ?  ?  ?  ?  ? ?Discharge Plan and Services ?In-house Referral: Clinical Social Work ?  ?Post Acute Care Choice: [place]          ?  ?  ?  ?  ?  ?  ?  ?  ?  ?  ? ?Social Determinants of Health (SDOH) Interventions ?  ? ? ?Readmission Risk Interventions ?   ? View : No data to display.  ?  ?  ?  ? ? ? ? ? ?

## 2021-11-15 NOTE — Discharge Summary (Signed)
?Physician Discharge Summary ?  ?Patient: Taylor Mccarthy MRN: [id number] DOB: 03-11-1941  ?Admit date:     11/12/2021  ?Discharge date: 11/15/21  ?Discharge Physician: Barton Dubois  ? ?PCP: McLean-Scocuzza, Nino Glow, MD  ? ?Recommendations at discharge:  ?Repeat CBC to follow hemoglobin stability and platelets trend. ?Repeat basic metabolic panel to follow ultralights renal function.. ? ?Discharge Diagnoses: ?Principal Problem: ?  Pelvic fracture ([place]) ?Active Problems: ?  Longstanding persistent atrial fibrillation ([place]) ?  Hypothyroidism ?  Vitamin D deficiency ?  Depression ?  Rhabdomyolysis ?  Sacral fracture, closed ([place]) ?  Pelvic hematoma, female ? ?Brief hospital course: ?Taylor Mccarthy is a 81 y.o. female with medical history significant of persistent atrial fibrillation on chronic Xarelto, vitamin D deficiency, history of colon cancer s/p resection 1999, depression and hypothyroidism; who presented to the emergency department after mechanical fall at home and inability to get up.  Patient reports tripping and landing on her right side with inability to stand or independently walk after the fall.  Patient expressed having to stay multiple hours on the floor after she finally was able to scoop and reached the phone to request assistance.  Patient denies dysuria, hematuria, melena, hematochezia, chest pain, shortness of breath, loss of consciousness, focal weakness, headaches or any other complaints. ?Of note, patient reports hitting her head in the way down after falling but never lost consciousness as mentioned above. ?  ?Vaccinated and boosted against COVID. ?  ?Work-up in the ED demonstrating pelvic fracture affecting superior and inferior pubic rami; there is also sacral fracture and surrounding hematoma.  Case was discussed with orthopedic service who did not feel the need for surgical intervention and recommended admission for pain management and physical therapy evaluation. ?  ?Patient lives alone and was  able to perform all her activities of daily living independently prior to this admission.  Hemodynamically stable. ? ?Assessment and Plan: ?* Pelvic fracture ([place]) ?- Images demonstrating superior and inferior Pubic rami fracture of the right hip; and there is also right sacral fracture appreciated on CT scan. ?-There is a moderate extraperitoneal hemorrhage and hematoma in the pelvis with also a small presacral hematoma.  No evacuation needed. ?-Case discussed with orthopedic surgery who felt no need for surgical intervention required. ?-Continue analgesic therapy and PT/rehabiliatation at SNF. ?-Prior to admission patient was completely independent on her activities of daily living and living alone. ? ?Sacral fracture, closed ([place]) ?-no surgical intervention needed ?-continue PRN analgesics and physical therapy. ? ?Rhabdomyolysis ?-Mild in the setting of prolonged immobility after mechanical fall. ?-Continue to follow CK levels ?-Advised to maintain adequate hydration. ?-At time of admission CK level 843; most recent level in the 400 range.. ? ?Depression ?-Stable mood overall ?-Continue the use of sertraline. ? ?Vitamin D deficiency ?-Vitamin D 45.39 ?-Continue the use of [place] ? ?Hypothyroidism ?-Continue Synthroid ? ?Longstanding persistent atrial fibrillation ([place]) ?-Rate controlled ?-Continue the use of metoprolol and Xarelto for secondary prevention. ?-Hemoglobin levels has remained stable; patient is hemodynamically stable. ? ? ? ?Consultants: Orthopedic service ?Procedures performed: See below for x-ray reports. ?Disposition: Skilled nursing facility ?Diet recommendation:  ?Discharge Diet Orders (From admission, onward)  ? ?  Start     Ordered  ? 11/15/21 0000  Diet - low sodium heart healthy       ? 11/15/21 0920  ? ?  ?  ? ?  ? ?Cardiac diet ?DISCHARGE MEDICATION: ?Allergies as of 11/15/2021   ? ?   Reactions  ? Morpholine  Salicylate   ? Augmentin [amoxicillin-pot Clavulanate]   ? Nausea x 3 days  ?  Codeine   ? Nausea   ? Other   ? Tree pollen   ? ?  ? ?  ?Medication List  ?  ? ?TAKE these medications   ? ?acetaminophen 500 MG tablet ?Commonly known as: TYLENOL ?Take 2 tablets (1,000 mg total) by mouth every 8 (eight) hours. ?  ?CALCIUM 600 PO ?Take 600 mg by mouth daily. ?  ?feeding supplement Liqd ?Take 237 mLs by mouth 2 (two) times daily between meals. ?  ?methocarbamol 500 MG tablet ?Commonly known as: Robaxin ?Take 1 tablet (500 mg total) by mouth every 8 (eight) hours as needed for muscle spasms. ?  ?metoprolol succinate 50 MG 24 hr tablet ?Commonly known as: TOPROL-XL ?TAKE 1 TABLET BY MOUTH ONCE DAILY. NEEDS OFFICE VISIT FOR FURTHER REFILLS. ?What changed:  ?how much to take ?how to take this ?when to take this ?  ?multivitamin tablet ?Take 1 tablet by mouth daily. ?  ?oxyCODONE 5 MG immediate release tablet ?Commonly known as: Oxy IR/ROXICODONE ?Take 1 tablet (5 mg total) by mouth every 6 (six) hours as needed for severe pain. ?  ?sertraline 50 MG tablet ?Commonly known as: ZOLOFT ?TAKE 1 TABLET EVERY DAY IN THE MORNING ?What changed: See the new instructions. ?  ?Synthroid 75 MCG tablet ?Generic drug: levothyroxine ?TAKE 1 TAB (75 MCG) DAILY BEFORE BREAKFAST. NO FURTHER REFILLS NEEDS APPT IN OFFICE/CALL FOR APPOINTMENT ASAP ?What changed:  ?how much to take ?how to take this ?when to take this ?  ?Xarelto 20 MG Tabs tablet ?Generic drug: rivaroxaban ?TAKE 1 TABLET EVERY DAY WITH SUPPER ?What changed: See the new instructions. ?  ? ?  ? ? Contact information for follow-up providers   ? ? McLean-Scocuzza, Nino Glow, MD. Schedule an appointment as soon as possible for a visit in 2 week(s).   ?Specialty: Internal Medicine ?Why: After discharge from the skilled nursing facility. ?Contact information: ?[address] ?[place] [address] ?[phone] ? ? ?  ?  ? ? Wellington Hampshire, MD .   ?Specialty: Cardiology ?Contact information: ?[address] ?[address] ?[place]  [address] ?[phone] ? ? ?  ?  ? ?  ?  ? ? Contact information for after-discharge care   ? ? Destination   ? ? [facility] .   ?Service: Skilled Nursing ?Contact information: ?[address] ?[place] ?[phone] ? ?  ?  ? ?  ?  ? ?  ?  ? ?  ? ?Discharge Exam: ?Filed Weights  ? 11/12/21 1149 11/12/21 1817  ?Weight: 76.6 kg 74.7 kg  ? ?General exam: Alert, awake, oriented x 3; still complaining of pain but is slightly more control around analgesics.  No fever, no chest pain, no nausea, no vomiting, no palpitations. ?Respiratory system: Clear to auscultation. Respiratory effort normal.  Good saturation on room air.  No using accessory muscle. ?Cardiovascular system: Rate controlled, no rubs, no gallops, positive murmur; no JVD. ?Gastrointestinal system: Abdomen is nondistended, soft and nontender. No organomegaly or masses felt. Normal bowel sounds heard. ?Central nervous system: Alert and oriented. No focal neurological deficits. ?Extremities: No cyanosis or clubbing. ?Skin: No r petechiae. ?Psychiatry: Judgement and insight appear normal. Mood & affect appropriate.  ? ?Condition at discharge: Stable and improved. ? ?The results of significant diagnostics from this hospitalization (including imaging, microbiology, ancillary and laboratory) are listed below for reference.  ? ?  Imaging Studies: ?DG Chest 1 View ? ?Result Date: 11/12/2021 ?CLINICAL DATA:  Fall. Low lumbar pain which radiates to posterior right hip and knee. EXAM: CHEST  1 VIEW COMPARISON:  None FINDINGS: Cardiac enlargement. No pleural effusion or edema. No airspace opacities identified. Visualized osseous structures are unremarkable. IMPRESSION: 1. No acute cardiopulmonary abnormalities. 2. Cardiac enlargement. Electronically Signed   By: Kerby Moors M.D.   On: 11/12/2021 13:15  ? ?DG Lumbar Spine Complete ? ?Result Date: 11/12/2021 ?CLINICAL DATA:  Trauma, fall EXAM: LUMBAR SPINE - COMPLETE  4+ VIEW COMPARISON:  None. FINDINGS: No recent fracture is seen. Osteopenia is seen in bony structures. There is 11 mm anterolisthesis at L4-L5 level. There is 5 mm anterolisthesis at L3-L4 level. Degenerative changes are noted

## 2021-11-15 NOTE — Progress Notes (Signed)
Report given to [facility] ?

## 2021-11-16 DIAGNOSIS — I4811 Longstanding persistent atrial fibrillation: Secondary | ICD-10-CM | POA: Diagnosis not present

## 2021-11-16 DIAGNOSIS — E039 Hypothyroidism, unspecified: Secondary | ICD-10-CM | POA: Diagnosis not present

## 2021-11-16 DIAGNOSIS — M6281 Muscle weakness (generalized): Secondary | ICD-10-CM | POA: Diagnosis not present

## 2021-11-16 DIAGNOSIS — F331 Major depressive disorder, recurrent, moderate: Secondary | ICD-10-CM | POA: Diagnosis not present

## 2021-11-16 DIAGNOSIS — M5430 Sciatica, unspecified side: Secondary | ICD-10-CM | POA: Diagnosis not present

## 2021-11-16 DIAGNOSIS — E559 Vitamin D deficiency, unspecified: Secondary | ICD-10-CM | POA: Diagnosis not present

## 2021-11-16 DIAGNOSIS — S32810S Multiple fractures of pelvis with stable disruption of pelvic ring, sequela: Secondary | ICD-10-CM | POA: Diagnosis not present

## 2021-11-16 DIAGNOSIS — S3210XA Unspecified fracture of sacrum, initial encounter for closed fracture: Secondary | ICD-10-CM | POA: Diagnosis not present

## 2021-11-16 DIAGNOSIS — F339 Major depressive disorder, recurrent, unspecified: Secondary | ICD-10-CM | POA: Diagnosis not present

## 2021-11-16 DIAGNOSIS — G4700 Insomnia, unspecified: Secondary | ICD-10-CM | POA: Diagnosis not present

## 2021-11-16 DIAGNOSIS — S3210XS Unspecified fracture of sacrum, sequela: Secondary | ICD-10-CM | POA: Diagnosis not present

## 2021-11-16 NOTE — TOC Progression Note (Signed)
Transition of Care (TOC) - Progression Note  ? ? ?Patient Details  ?Name: Taylor Mccarthy ?MRN: [id number] ?Date of Birth: 26-Mar-1941 ? ?Transition of Care (TOC) CM/SW Contact  ?Salome Arnt, LCSW ?Phone Number: ?11/16/2021, 8:16 AM ? ?Clinical Narrative:  LCSW confirmed pt is on EMS list for today and there are convalescent trucks available. Ebony Hail at [facility] updated.   ? ? ? ?Expected Discharge Plan: [place] ?Barriers to Discharge: No Barriers Identified ? ?Expected Discharge Plan and Services ?Expected Discharge Plan: [place] ?In-house Referral: Clinical Social Work ?  ?Post Acute Care Choice: [place] ?Living arrangements for the past 2 months: [place] ?Expected Discharge Date: 11/15/21               ?  ?  ?  ?  ?  ?  ?  ?  ?  ?  ? ? ?Social Determinants of Health (SDOH) Interventions ?  ? ?Readmission Risk Interventions ?   ? View : No data to display.  ?  ?  ?  ? ? ?

## 2021-11-16 NOTE — Progress Notes (Signed)
81 y.o. female with medical history significant of persistent atrial fibrillation on chronic Xarelto, vitamin D deficiency, history of colon cancer s/p resection 1999, depression and hypothyroidism; who presented to the emergency department after mechanical fall at home and inability to get up.  Patient reports tripping and landing on her right side with inability to stand or independently walk after the fall.  Patient expressed having to stay multiple hours on the floor after she finally was able to scoop and reached the phone to request assistance.  ? ?Work-up in the ED demonstrating pelvic fracture affecting superior and inferior pubic rami; there is also sacral fracture and surrounding hematoma.  Case was discussed with orthopedic service who did not feel the need for surgical intervention and recommended admission for pain management and physical therapy evaluation. ?  ?Patient lives alone and was able to perform all her activities of daily living independently prior to this admission.  ? ? ?Subjective: ?Patient denies fevers, chills, headache, chest pain, dyspnea, nausea, vomiting, diarrhea, abdominal pain, dysuria, hematuria, hematochezia, and melena.  Pelvic pain is controlled ? ? ?Vitals:  ? 11/15/21 1208 11/15/21 2202 11/16/21 5643 11/16/21 0631  ?BP: 110/68 121/80 121/83 125/77  ?Pulse: 83 86 86 82  ?Resp: '17 16 18 19  '$ ?Temp: 99.1 ?F (37.3 ?C) 98.3 ?F (36.8 ?C) 97.7 ?F (36.5 ?C) (!) 97.5 ?F (36.4 ?C)  ?TempSrc: Oral Oral Oral Oral  ?SpO2: 98% 96% 95% 97%  ?Weight:      ?Height:      ? ?CV--IRRR ?Lung--CTA ?Abd--soft+BS/NT ?Ext:no C/C/E, no rash ? ? ?Assessment/Plan: ?Pelvic Fracture ?-Case discussed with orthopedic surgery who felt no need for surgical intervention required ?-remains clinically stable for discharge ?-recheck CBC in week ?-stable for d/c to SNF ? ?Sacral Fracture ?-continue nonoperative management per ortho ?-stable for d/c to SNF ? ?Persistent Afib ?-stable ?-rate controlled ?-continue  Xarelto and metoprolol ?-stable for d/c to SNF ? ? ? ?Orson Eva, DO ?[facility] ? ?

## 2021-11-16 NOTE — Progress Notes (Signed)
Report called to [facility], EMS provided transportation, AVS printed and placed in discharge packet, the patient's friend, Taylor Mccarthy was updated that the patient had been transferred. ?

## 2021-12-06 DIAGNOSIS — F331 Major depressive disorder, recurrent, moderate: Secondary | ICD-10-CM | POA: Diagnosis not present

## 2021-12-27 ENCOUNTER — Telehealth: Payer: Self-pay | Admitting: Internal Medicine

## 2021-12-27 NOTE — Telephone Encounter (Signed)
Taylor Mccarthy called in stating that there office received an referral last week for pt... Taylor Mccarthy stated that there was a delay in care due to lack of staffing... Taylor Mccarthy stated that pt was seen on Saturday,May 27 and pt will be seen tomorrow Wednesday, May 31.Marland KitchenMarland KitchenMarland Kitchen

## 2022-01-10 ENCOUNTER — Other Ambulatory Visit: Payer: Self-pay | Admitting: Internal Medicine

## 2022-01-10 DIAGNOSIS — F32A Depression, unspecified: Secondary | ICD-10-CM

## 2022-01-10 MED ORDER — SERTRALINE HCL 50 MG PO TABS: 50.0000 mg | ORAL_TABLET | Freq: Every day | ORAL | 3 refills | Status: DC

## 2022-02-01 ENCOUNTER — Other Ambulatory Visit: Payer: Self-pay | Admitting: Internal Medicine

## 2022-02-01 DIAGNOSIS — E039 Hypothyroidism, unspecified: Secondary | ICD-10-CM

## 2022-02-02 ENCOUNTER — Other Ambulatory Visit: Payer: Self-pay | Admitting: Internal Medicine

## 2022-02-02 DIAGNOSIS — E039 Hypothyroidism, unspecified: Secondary | ICD-10-CM

## 2022-02-02 MED ORDER — SYNTHROID 75 MCG PO TABS: ORAL_TABLET | ORAL | 2 refills | Status: DC

## 2022-02-14 ENCOUNTER — Other Ambulatory Visit: Payer: Self-pay | Admitting: Internal Medicine

## 2022-05-02 ENCOUNTER — Ambulatory Visit: Payer: Medicare HMO | Admitting: Physician Assistant

## 2022-05-04 ENCOUNTER — Telehealth: Payer: Self-pay | Admitting: Internal Medicine

## 2022-05-04 NOTE — Telephone Encounter (Signed)
Copied from [place] [phone]. Topic: Medicare AWV >> May 04, 2022 11:01 AM Devoria Glassing wrote: Reason for CRM: Called patient to schedule Annual Wellness Visit.  Please schedule with Nurse Health Advisor Denisa O'Brien-Blaney, LPN at [facility]. This appt can be telephone or office visit.  Please call [phone] ask for [facility]

## 2022-05-18 ENCOUNTER — Telehealth: Payer: Self-pay | Admitting: Internal Medicine

## 2022-05-18 NOTE — Telephone Encounter (Signed)
Copied from [place] [phone]. Topic: Medicare AWV >> May 18, 2022 10:26 AM Devoria Glassing wrote: Reason for CRM: Attempted to schedule AWV. Unable to LVM.  Will try at later time.

## 2022-05-22 ENCOUNTER — Encounter: Payer: Self-pay | Admitting: *Deleted

## 2022-05-22 ENCOUNTER — Other Ambulatory Visit: Payer: Self-pay | Admitting: Physician Assistant

## 2022-06-08 NOTE — Progress Notes (Signed)
Lm for pt to cb.

## 2022-06-09 ENCOUNTER — Other Ambulatory Visit: Payer: Self-pay | Admitting: *Deleted

## 2022-06-09 NOTE — Progress Notes (Unsigned)
Cardiology Office Note    Date:  06/13/2022   ID:  Taylor Mccarthy, DOB 16-Jul-1941, MRN [id number]  PCP:  McLean-Scocuzza, Nino Glow, MD  Cardiologist:  Kathlyn Sacramento, MD  Electrophysiologist:  None   Chief Complaint: Follow-up  History of Present Illness:   Taylor Mccarthy is a 81 y.o. female with history of permanent A. fib on Xarelto, HFpEF, moderate mitral and tricuspid regurgitation, frequent PVCs, hypothyroidism colon cancer, and chronic dizziness who presents for follow-up of her A. fib.   She was previously followed by Dr. Delman Cheadle. Echo in 06/2017 showed and EF of 55-60%, mild LVH, severe biatrial enlargement, moderate to severe mitral regurgitation, severe tricuspid regurgitation, and PASP 37-50 mmHg. She has reported undergoing DCCV twice in the past, which were not successful leading to rate control strategy. She was seen in the office in 01/2019 noting dizziness and lightheadedness. She was noted to have frequent PVCs on 12-lead EKG at that visit. Her Toprol was titrated to 50 mg daily. Echo showed an EF of 55-60%, mild LVH, normal RVSF and cavity size, PASP 39.2 mmHg, severe biatrial enlargement, moderate to severe mitral regurgitation, moderate to severe tricuspid regurgitation. Overall, this was a stable study when compared to 2018.  Echo in 04/2020 showed an EF of 45 to 50%, global hypokinesis, low normal RV systolic function with severely enlarged RV cavity size and normal PASP, severe biatrial enlargement, moderate to severe mitral regurgitation with moderate holosystolic prolapse of both leaflets of the mitral valve, severe tricuspid regurgitation, and an estimated right atrial pressure of 3 mmHg.  This study was personally reviewed by her primary cardiologist and felt to demonstrate normal LV systolic function and a stable moderate mitral and tricuspid regurgitation with only mild pulmonary hypertension.  She was seen in the office in 11/2020 noting bilateral lower extremity paresthesias  as well as a cold lower extremities.   Most recent echo from 06/02/2021 demonstrated a low normal LV systolic function of 50 to 55%, no regional wall motion abnormalities, mild LVH, normal RV systolic function and ventricular cavity size, moderately elevated PASP estimated at 46.8 mmHg, severe biatrial enlargement, moderate to severe mitral regurgitation with moderate late systolic prolapse of both leaflets of the mitral valve, severe tricuspid regurgitation, and an estimated right atrial pressure of 15 mmHg.    She was last seen in the office in 07/2021 and was without symptoms of angina or decompensation.  She reported a mechanical fall in 05/2021, while sleepwalking.  She did not hit her head or suffer LOC.   Since that she was last seen, she was admitted to the hospital in 10/2021 after sustaining a mechanical fall and was found to have a pelvic fracture with moderate extraperitoneal hemorrhage and hematoma in the pelvis without need for evacuation.  She was evaluated by orthopedic surgery with no indication for surgical intervention.  Hospital course was complicated by mild rhabdomyolysis in the setting of immobility following a fall.  She comes in doing well from a cardiac perspective and is without symptoms of angina or decompensation.  No dyspnea, palpitations, dizziness, presyncope, or syncope.  She has had 1 further falls since her above fall in the spring of this year.  She did not hit her head or suffer LOC.  No symptoms concerning for hematochezia or melena.  She does continue to note bilateral lower extremity paresthesias in the setting of peripheral neuropathy as well as cool lower extremities.  Not currently wearing compression stockings.   Labs independently reviewed: 10/2021 -  Hgb 11.4, PLT 145, potassium 4.6, BUN 17, serum creatinine 0.6, TSH normal, magnesium 1.9 04/2021 - TC 134, TG 99, HDL 49, LDL 64  Past Medical History:  Diagnosis Date   Atrial fibrillation, persistent ([place])     Cancer ([place])    colon cancer 1999 s/p resection    History of colon polyps    Insomnia    Low back pain    Mitral regurgitation    Pap smear abnormality of cervix with ASCUS favoring benign    Sciatica    Vitamin D deficiency     Past Surgical History:  Procedure Laterality Date   COLON RESECTION     1999 for cancer    TONSILLECTOMY     WISDOM TOOTH EXTRACTION      Current Medications: Current Meds  Medication Sig   acetaminophen (TYLENOL) 500 MG tablet Take 2 tablets (1,000 mg total) by mouth every 8 (eight) hours.   Calcium Carbonate (CALCIUM 600 PO) Take 600 mg by mouth daily.   feeding supplement (ENSURE ENLIVE / ENSURE PLUS) LIQD Take 237 mLs by mouth 2 (two) times daily between meals.   methocarbamol (ROBAXIN) 500 MG tablet Take 1 tablet (500 mg total) by mouth every 8 (eight) hours as needed for muscle spasms.   Multiple Vitamin (MULTIVITAMIN) tablet Take 1 tablet by mouth daily.   oxyCODONE (OXY IR/ROXICODONE) 5 MG immediate release tablet Take 1 tablet (5 mg total) by mouth every 6 (six) hours as needed for severe pain.   rivaroxaban (XARELTO) 20 MG TABS tablet Take 1 tablet (20 mg total) by mouth daily with supper.   sertraline (ZOLOFT) 50 MG tablet Take 1 tablet (50 mg total) by mouth daily.   SYNTHROID 75 MCG tablet TAKE 1 TAB (75 MCG) DAILY BEFORE BREAKFAST.   [DISCONTINUED] metoprolol succinate (TOPROL-XL) 50 MG 24 hr tablet TAKE 1 TABLET BY MOUTH ONCE DAILY. NEEDS OFFICE VISIT FOR FURTHER REFILLS.    Allergies:   Morpholine salicylate, Augmentin [amoxicillin-pot clavulanate], Codeine, and Other   Social History   Socioeconomic History   Marital status: Divorced    Spouse name: Not on file   Number of children: Not on file   Years of education: Not on file   Highest education level: Not on file  Occupational History   Not on file  Tobacco Use   Smoking status: Former   Smokeless tobacco: Never   Tobacco comments:    former quit in 70s light smoker per  pt   Vaping Use   Vaping Use: Never used  Substance and Sexual Activity   Alcohol use: Not Currently    Alcohol/week: 14.0 standard drinks of alcohol    Types: 14 Glasses of wine per week    Comment: 2 glasses at night   Drug use: Not Currently   Sexual activity: Not Currently  Other Topics Concern   Not on file  Social History Narrative   Divorced    College grad    Moved from [place] to [place]   Former Office manager    No guns   Wears seat belts    Social Determinants of Health   Financial Resource Strain: [place]  (04/25/2021)   Overall Financial Resource Strain (CARDIA)    Difficulty of Paying Living Expenses: Not hard at all  Food Insecurity: No Food Insecurity (04/25/2021)   Hunger Vital Sign    Worried About Running Out of Food in the Last Year: Never true    Ran Out of  Food in the Last Year: Never true  Transportation Needs: No Transportation Needs (04/25/2021)   PRAPARE - Hydrologist (Medical): No    Lack of Transportation (Non-Medical): No  Physical Activity: Sufficiently Active (04/25/2021)   Exercise Vital Sign    Days of Exercise per Week: 3 days    Minutes of Exercise per Session: 60 min  Stress: No Stress Concern Present (04/25/2021)   [place]    Feeling of Stress : Not at all  Social Connections: Unknown (04/25/2021)   Social Connection and Isolation Panel [NHANES]    Frequency of Communication with Friends and Family: More than three times a week    Frequency of Social Gatherings with Friends and Family: More than three times a week    Attends Religious Services: Not on Advertising copywriter or Organizations: Not on file    Attends Archivist Meetings: Not on file    Marital Status: Not on file     Family History:  The patient's family history includes Heart disease in her father; Hyperlipidemia in her father;  Osteopenia in her sister.  ROS:   12-point review of systems is negative unless noted in HPI   EKGs/Labs/Other Studies Reviewed:    Studies reviewed were summarized above. The additional studies were reviewed today:  2D echo 06/02/2021: 1. Left ventricular ejection fraction, by estimation, is 50 to 55%. The  left ventricle has low normal function. The left ventricle has no regional  wall motion abnormalities. There is mild left ventricular hypertrophy.  Left ventricular diastolic  parameters are consistent with Grade II diastolic dysfunction  (pseudonormalization).   2. Right ventricular systolic function is normal. The right ventricular  size is normal. There is moderately elevated pulmonary artery systolic  pressure. The estimated right ventricular systolic pressure is 28.3 mmHg.   3. Left atrial size was severely dilated.   4. Right atrial size was severely dilated.   5. The mitral valve is normal in structure. Moderate to severe mitral  valve regurgitation. No evidence of mitral stenosis. There is moderate  late systolic prolapse of both leaflets of the mitral valve.   6. Tricuspid valve regurgitation is severe.   7. The aortic valve is normal in structure. Aortic valve regurgitation is  not visualized. No aortic stenosis is present.   8. The inferior vena cava is dilated in size with <50% respiratory  variability, suggesting right atrial pressure of 15 mmHg.   Comparison(s): LVEF 45-50%, Mod-Sev MR, MVP, Sev TR. __________   2D echo 05/18/2020: 1. Left ventricular ejection fraction, by estimation, is 45 to 50%. The  left ventricle has mildly decreased function. The left ventricle  demonstrates global hypokinesis. There is mild left ventricular  hypertrophy. Left ventricular diastolic parameters  are indeterminate.   2. Right ventricular systolic function is low normal. The right  ventricular size is severely enlarged. There is normal pulmonary artery  systolic pressure.    3. Left atrial size was severely dilated.   4. Right atrial size was severely dilated.   5. The mitral valve is abnormal. Moderate to severe mitral valve  regurgitation. There is moderate holosystolic prolapse of both leaflets of  the mitral valve.   6. Tricuspid valve regurgitation is severe.   7. The aortic valve is tricuspid. Aortic valve regurgitation is not  visualized. No aortic stenosis is present.   8. The inferior vena cava is normal  in size with greater than 50%  respiratory variability, suggesting right atrial pressure of 3 mmHg. __________   2D echo 03/27/2019: 1. The left ventricle has normal systolic function, with an ejection  fraction of 55-60%. The cavity size was normal. There is mildly increased  left ventricular wall thickness. Left ventricular diastolic Doppler  parameters are indeterminate.   2. The right ventricle has normal systolic function. The cavity was  normal. There is no increase in right ventricular wall thickness. Right  ventricular systolic pressure is mildly elevated with an estimated  pressure of 39.2 mmHg.   3. Left atrial size was severely dilated.   4. Right atrial size was severely dilated.   5. Mitral valve regurgitation is moderate to severe   6. Tricuspid valve regurgitation is moderate-severe.   7. Rhythm appears to be atrial fibrillation   8. Details above appear consistent with prior study dated 06/2017   EKG:  EKG is ordered today.  The EKG ordered today demonstrates Afib, 68 bpm, baseline artifact with nonspecific st/t changes  Recent Labs: 11/12/2021: Magnesium 1.9; TSH 1.700 11/13/2021: BUN 17; Creatinine, Ser 0.60; Hemoglobin 11.4; Platelets 145; Potassium 4.6; Sodium 139  Recent Lipid Panel    Component Value Date/Time   CHOL 134 05/25/2021 1633   CHOL 198 06/03/2018 1026   TRIG 99.0 05/25/2021 1633   HDL 49.80 05/25/2021 1633   HDL 75 06/03/2018 1026   CHOLHDL 3 05/25/2021 1633   VLDL 19.8 05/25/2021 1633   LDLCALC 64  05/25/2021 1633   LDLCALC 98 06/03/2018 1026    PHYSICAL EXAM:    VS:  BP 120/84 (BP Location: Left Arm, Patient Position: Sitting, Cuff Size: Normal)   Pulse 68   Wt 149 lb (67.6 kg)   SpO2 98%   BMI 24.05 kg/m   BMI: Body mass index is 24.05 kg/m.  Physical Exam Vitals reviewed.  Constitutional:      Appearance: She is well-developed.  HENT:     Head: Normocephalic and atraumatic.  Eyes:     General:        Right eye: No discharge.        Left eye: No discharge.  Neck:     Vascular: No JVD.  Cardiovascular:     Rate and Rhythm: Normal rate. Rhythm irregularly irregular.     Heart sounds: S1 normal and S2 normal. Heart sounds not distant. No midsystolic click and no opening snap. Murmur heard.     High-pitched blowing holosystolic murmur is present with a grade of 1/6 at the apex.     No friction rub.  Pulmonary:     Effort: Pulmonary effort is normal. No respiratory distress.     Breath sounds: Normal breath sounds. No decreased breath sounds, wheezing or rales.  Chest:     Chest wall: No tenderness.  Abdominal:     General: There is no distension.  Musculoskeletal:     Cervical back: Normal range of motion.     Right lower leg: No edema.     Left lower leg: No edema.  Skin:    General: Skin is warm and dry.     Nails: There is no clubbing.  Neurological:     Mental Status: She is alert and oriented to person, place, and time.  Psychiatric:        Speech: Speech normal.        Behavior: Behavior normal.        Thought Content: Thought content normal.  Judgment: Judgment normal.     Wt Readings from Last 3 Encounters:  06/13/22 149 lb (67.6 kg)  11/12/21 164 lb 10.9 oz (74.7 kg)  08/12/21 160 lb (72.6 kg)     ASSESSMENT & PLAN:   Permanent A-fib: Ventricular rates remain well controlled on Toprol-XL.  CHA2DS2-VASc at least 4 (CHF, age x2, sex category).  She remains on Xarelto 20 mg daily and does not meet reduced dosing criteria with a creatinine  clearance of 74.8.  No symptoms concerning for bleeding.  Hemoglobin and renal function stable earlier this year.  HFpEF: She appears euvolemic and well compensated.  Not currently requiring standing diuretic.  Mitral and tricuspid regurgitation: Largely stable on echo in 05/2021.  Update echo at next visit at patient request due to transportation to and from office visit.   Chronic venous insufficiency: No symptoms consistent with claudication.  Lower extremity imaging previously normal.  Recommend leg elevation and compression stockings.    Disposition: F/u with Dr. Fletcher Anon or an APP in 6 months with echo to be obtained same day as appointment.   Medication Adjustments/Labs and Tests Ordered: Current medicines are reviewed at length with the patient today.  Concerns regarding medicines are outlined above. Medication changes, Labs and Tests ordered today are summarized above and listed in the Patient Instructions accessible in Encounters.   Signed, Christell Faith, PA-C 06/13/2022 11:31 AM     [facility] [address] [place] [address] [place], [place] [address] [phone]

## 2022-06-09 NOTE — Telephone Encounter (Signed)
Pt Last OV: 04/2021  Sent patient a mychart message informing her to schedule an appt to establish care with a new PCP within 30 days.  I have pended a 30 day supply if you are okay refilling this medication

## 2022-06-11 MED ORDER — RIVAROXABAN 20 MG PO TABS: 20.0000 mg | ORAL_TABLET | Freq: Every day | ORAL | 0 refills | Status: DC

## 2022-06-13 ENCOUNTER — Ambulatory Visit: Payer: Medicare HMO | Attending: Physician Assistant | Admitting: Physician Assistant

## 2022-06-13 ENCOUNTER — Encounter: Payer: Self-pay | Admitting: Physician Assistant

## 2022-06-13 VITALS — BP 120/84 | HR 68 | Wt 149.0 lb

## 2022-06-13 DIAGNOSIS — I340 Nonrheumatic mitral (valve) insufficiency: Secondary | ICD-10-CM | POA: Diagnosis not present

## 2022-06-13 DIAGNOSIS — I872 Venous insufficiency (chronic) (peripheral): Secondary | ICD-10-CM

## 2022-06-13 DIAGNOSIS — I4821 Permanent atrial fibrillation: Secondary | ICD-10-CM

## 2022-06-13 DIAGNOSIS — I5032 Chronic diastolic (congestive) heart failure: Secondary | ICD-10-CM | POA: Diagnosis not present

## 2022-06-13 MED ORDER — METOPROLOL SUCCINATE ER 50 MG PO TB24
ORAL_TABLET | ORAL | 0 refills | Status: DC
Start: 2022-06-13 — End: 2022-10-30

## 2022-06-13 NOTE — Patient Instructions (Signed)
Medication Instructions:  No changes at this time.   *If you need a refill on your cardiac medications before your next appointment, please call your pharmacy*   Lab Work: None  If you have labs (blood work) drawn today and your tests are completely normal, you will receive your results only by: [facility] (if you have MyChart) OR A paper copy in the mail If you have any lab test that is abnormal or we need to change your treatment, we will call you to review the results.   Testing/Procedures: Your physician has requested that you have an echocardiogram. Echocardiography is a painless test that uses sound waves to create images of your heart. It provides your doctor with information about the size and shape of your heart and how well your heart's chambers and valves are working. This procedure takes approximately one hour. There are no restrictions for this procedure. Please do NOT wear cologne, perfume, aftershave, or lotions (deodorant is allowed). Please arrive 15 minutes prior to your appointment time.    Follow-Up: At [facility], you and your health needs are our priority.  As part of our continuing mission to provide you with exceptional heart care, we have created designated Provider Care Teams.  These Care Teams include your primary Cardiologist (physician) and Advanced Practice Providers (APPs -  Physician Assistants and Nurse Practitioners) who all work together to provide you with the care you need, when you need it.   Your next appointment:   6 month(s)  The format for your next appointment:   In Person  Provider:   Kathlyn Sacramento, MD or Christell Faith, PA-C        Important Information About Sugar

## 2022-06-16 ENCOUNTER — Telehealth: Payer: Self-pay | Admitting: Internal Medicine

## 2022-06-16 NOTE — Telephone Encounter (Signed)
Pt need a refill on SYNTHROID sent to [facility] in [place] [phone]

## 2022-06-19 ENCOUNTER — Other Ambulatory Visit: Payer: Self-pay | Admitting: *Deleted

## 2022-06-19 DIAGNOSIS — E039 Hypothyroidism, unspecified: Secondary | ICD-10-CM

## 2022-06-19 MED ORDER — SYNTHROID 75 MCG PO TABS: ORAL_TABLET | ORAL | 0 refills | Status: DC

## 2022-06-19 NOTE — Telephone Encounter (Signed)
Pt called about update for medication refill

## 2022-06-19 NOTE — Telephone Encounter (Signed)
I spoke with patient & explained to her our refill protocol. Last seen in October 2022. Pt was given a 30 days refill of Synthroid & transferred to [facility] to get her scheduled for a virtual visit to refill her medication. She will also keep appt to transfer to Dr. Volanda Napoleon in March. Pt is currently not mobile & states that she is learning how to walk again.

## 2022-07-10 IMAGING — DX DG CERVICAL SPINE COMPLETE 4+V
6 series · 6 of 6 positions shown · non-contrast
Comparison: None.

CLINICAL DATA: 80-year-old with cervical neck pain.

EXAM:
CERVICAL SPINE - COMPLETE 4+ VIEW

[cervical spine ap]
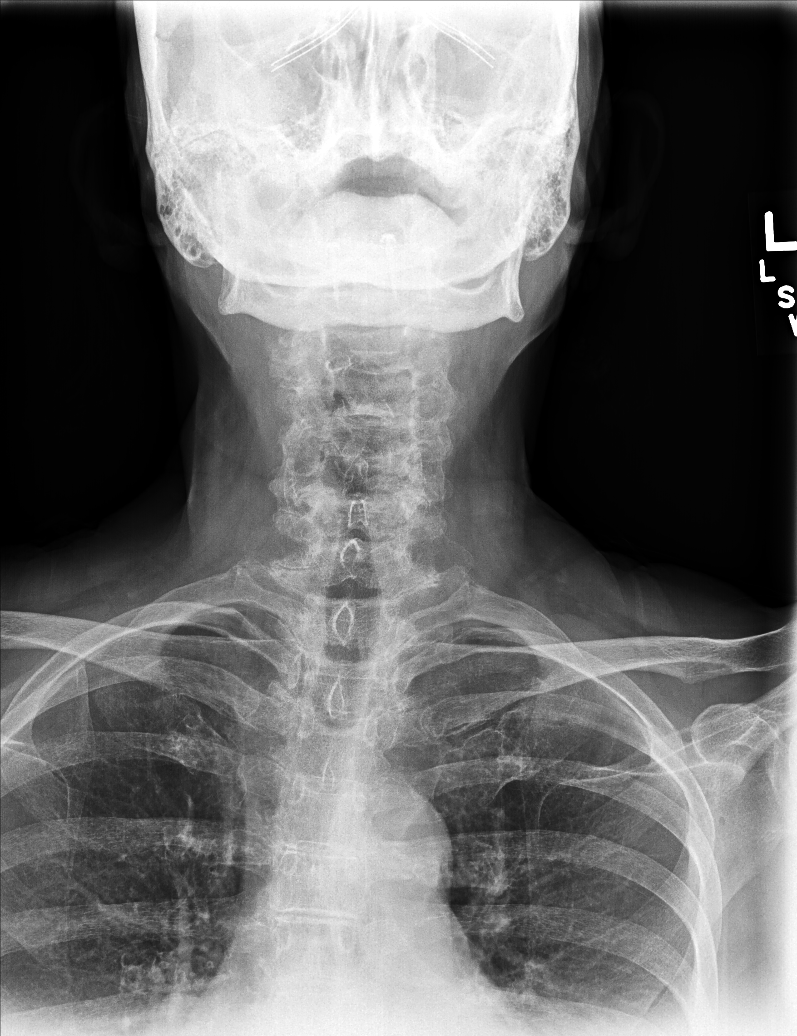

[cervical spine oblique (1 of 2)]
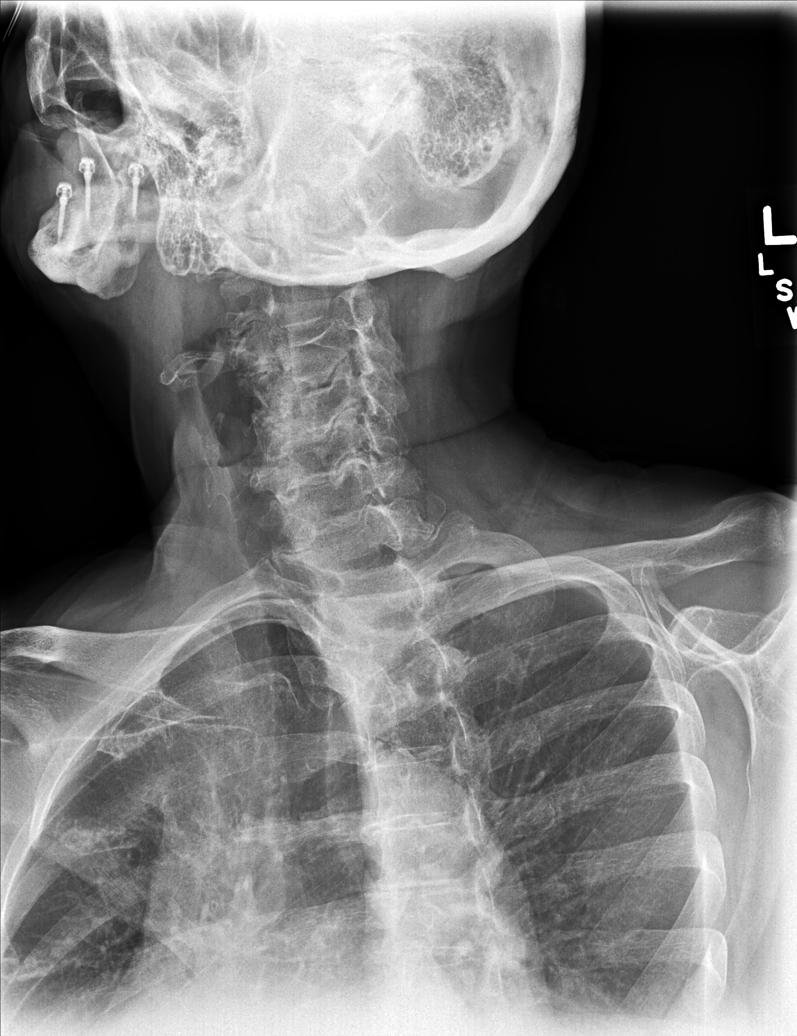

[cervical spine oblique (2 of 2)]
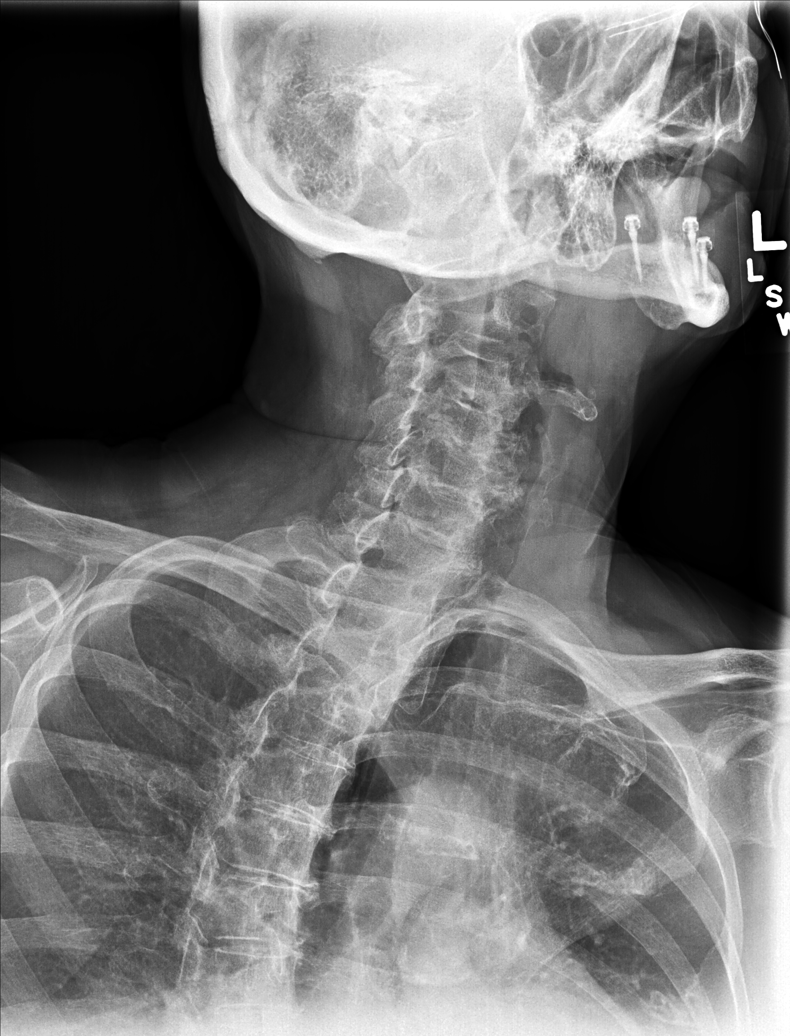

[cervical spine lat]
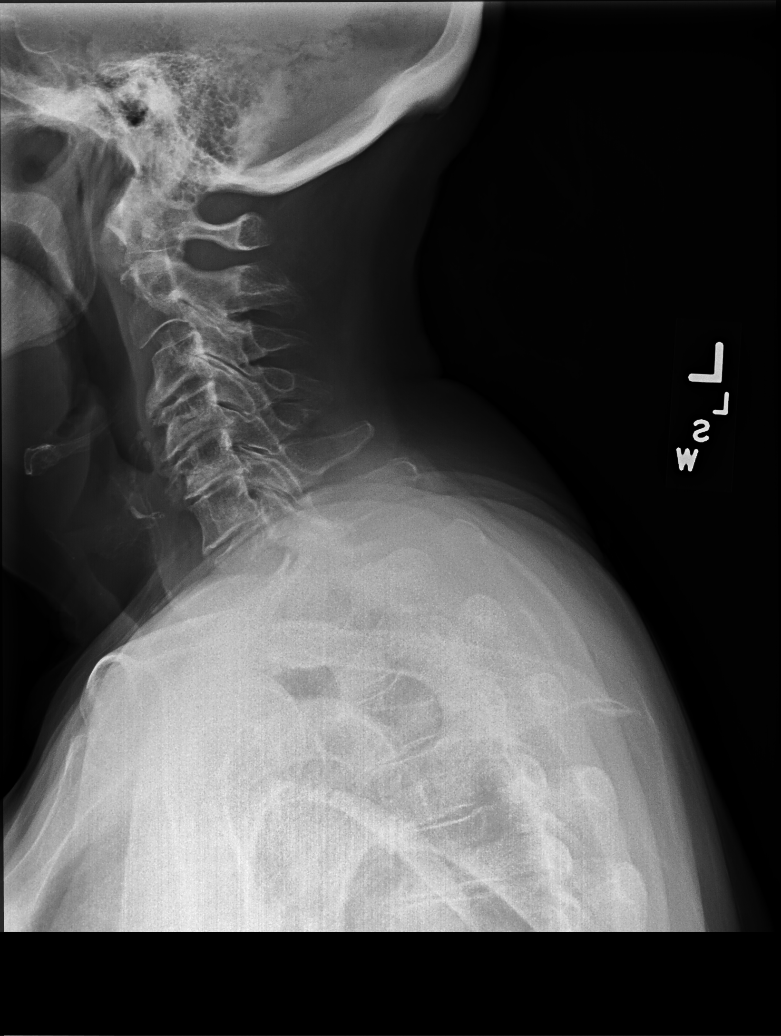

[swimmers lat]
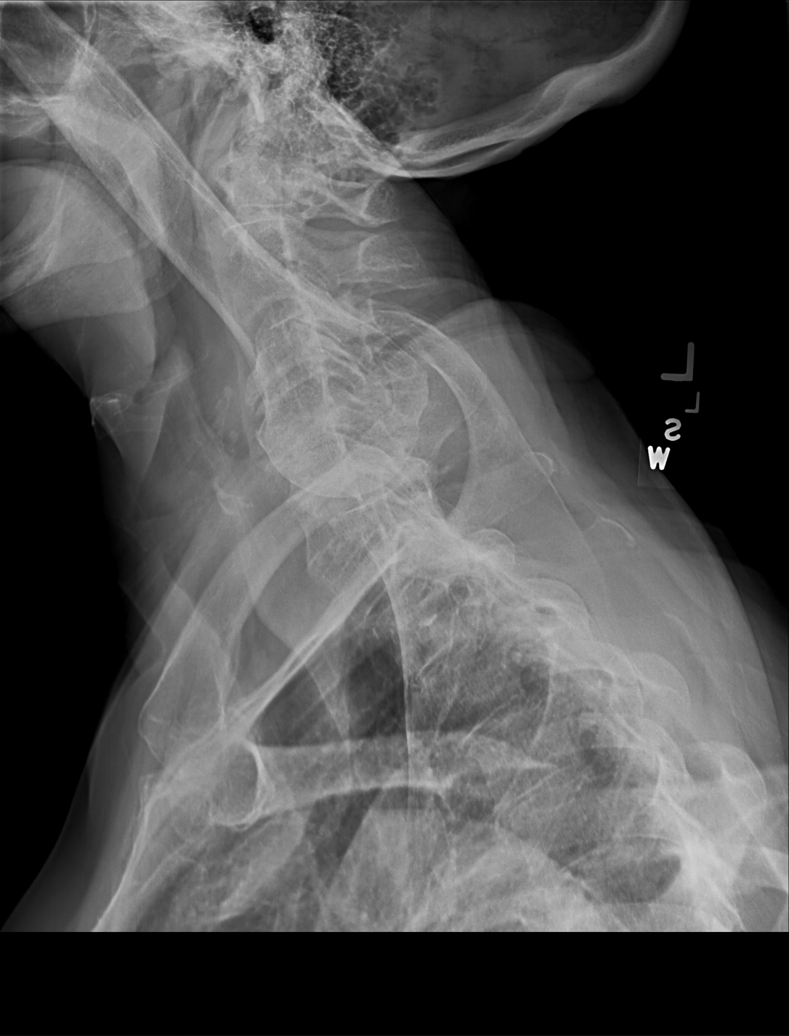

[cervical spine open mouth ap]
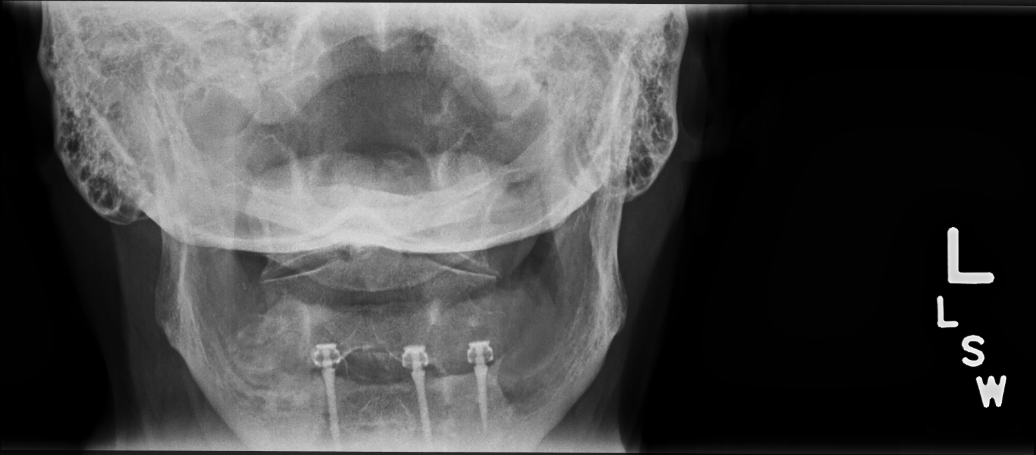

[6 of 6 positions shown; findings below may reference images not displayed]

FINDINGS: AP, lateral, lateral swimmer's, open-mouth odontoid, and bilateral
oblique views obtained. Slight straightening of normal lordosis.
Trace retrolisthesis of C4 on C5. Degenerative disc disease with
disc space narrowing and endplate spurring C3-C4 through C6-C7.
Multilevel facet hypertrophy. Some degree of bony neural foraminal
stenosis bilaterally at multiple levels. Vertebral body heights are
normal. No fracture or bony destruction. No prevertebral soft tissue
edema.
IMPRESSION: Diffuse degenerative disc disease and facet hypertrophy.

## 2022-07-11 ENCOUNTER — Other Ambulatory Visit: Payer: Self-pay | Admitting: Cardiovascular Disease

## 2022-07-11 DIAGNOSIS — I4891 Unspecified atrial fibrillation: Secondary | ICD-10-CM

## 2022-07-11 NOTE — Telephone Encounter (Signed)
Prescription refill request for Xarelto received.  Indication: Afib  Last office visit: 06/13/22 Idolina Primer)  Weight: 67.6kg (11/13/21)  Age: 81 Scr: 0.60  CrCl: 75.71m/min  Appropriate dose and refill sent to requested pharmacy.

## 2022-07-25 ENCOUNTER — Encounter: Payer: Self-pay | Admitting: Family Medicine

## 2022-07-25 ENCOUNTER — Telehealth (INDEPENDENT_AMBULATORY_CARE_PROVIDER_SITE_OTHER): Payer: Medicare HMO | Admitting: Family Medicine

## 2022-07-25 VITALS — Ht 66.0 in | Wt 158.0 lb

## 2022-07-25 DIAGNOSIS — F39 Unspecified mood [affective] disorder: Secondary | ICD-10-CM

## 2022-07-25 DIAGNOSIS — E039 Hypothyroidism, unspecified: Secondary | ICD-10-CM

## 2022-07-25 DIAGNOSIS — Z13220 Encounter for screening for lipoid disorders: Secondary | ICD-10-CM

## 2022-07-25 DIAGNOSIS — I5032 Chronic diastolic (congestive) heart failure: Secondary | ICD-10-CM

## 2022-07-25 DIAGNOSIS — I4811 Longstanding persistent atrial fibrillation: Secondary | ICD-10-CM

## 2022-07-25 DIAGNOSIS — F32A Depression, unspecified: Secondary | ICD-10-CM

## 2022-07-25 MED ORDER — SERTRALINE HCL 50 MG PO TABS: 50.0000 mg | ORAL_TABLET | Freq: Every day | ORAL | 0 refills | Status: DC

## 2022-07-25 MED ORDER — SYNTHROID 75 MCG PO TABS: ORAL_TABLET | ORAL | 0 refills | Status: DC

## 2022-07-25 NOTE — Progress Notes (Signed)
Virtual Visit via Telephone Note  I connected with Taylor Mccarthy on 08/06/22 at 1158 by telephone and verified that I am speaking with the correct person using two identifiers. Taylor Mccarthy is currently located at home and  is currently alone during this visit. The provider, Carollee Leitz, MD, is located in their home at time of visit.  I discussed the limitations, risks, security and privacy concerns of performing an evaluation and management service by telephone and the availability of in person appointments. I also discussed with the patient that there may be a patient responsible charge related to this service. The patient expressed understanding and agreed to proceed.  Subjective: PCP: No primary care provider on file.  Chief Complaint  Patient presents with   Medical Management of Chronic Issues    Medication refills on Synthroid, Sertraline    HPI  Request made for medication refills  Hypothyroidism Asymptomatic.  Currently takes Synthroid 75 mcg daily and tolerating well. Has not had TSH level since 4/23, at that time within normal limits.  Reports compliancy with medication.  Mood disorder Doing well.  Denies any SI/HI.  Has been taking them cetirizine 50 mg daily for years and tolerating well.  Requesting refill today.      ROS: Per HPI  Current Outpatient Medications:    acetaminophen (TYLENOL) 500 MG tablet, Take 2 tablets (1,000 mg total) by mouth every 8 (eight) hours., Disp: , Rfl:    Calcium Carbonate (CALCIUM 600 PO), Take 600 mg by mouth daily., Disp: , Rfl:    metoprolol succinate (TOPROL-XL) 50 MG 24 hr tablet, Take with or immediately following a meal., Disp: 90 tablet, Rfl: 0   Multiple Vitamin (MULTIVITAMIN) tablet, Take 1 tablet by mouth daily., Disp: , Rfl:    rivaroxaban (XARELTO) 20 MG TABS tablet, TAKE ONE TABLET BY MOUTH ONCE DAILY WITH SUPPER, Disp: 90 tablet, Rfl: 1   sertraline (ZOLOFT) 50 MG tablet, Take 1 tablet (50 mg total) by mouth daily.,  Disp: 90 tablet, Rfl: 0   SYNTHROID 75 MCG tablet, TAKE 1 TAB (75 MCG) DAILY BEFORE BREAKFAST., Disp: 30 tablet, Rfl: 0  Observations/Objective: A&O  No respiratory distress or wheezing audible over the phone Mood, judgement, and thought processes all WNL  Assessment and Plan: Mood disorder [facility]) Assessment & Plan: Chronic.  Stable. Refill sertraline 50 mg daily x 90 tabs Will need office visit prior to next refill  Orders: -     Sertraline HCl; Take 1 tablet (50 mg total) by mouth daily.  Dispense: 90 tablet; Refill: 0  Longstanding persistent atrial fibrillation (HCC) Assessment & Plan: Chronic.  Asymptomatic. Follows with cardiology Continue Xarelto 20 mg daily   Chronic heart failure with preserved ejection fraction (HCC) Assessment & Plan: Chronic. Follows with cardiology Continue metoprolol 25 mg daily    Hypothyroidism, unspecified type Assessment & Plan: Chronic.  Stable. Refill Synthroid 75 mcg's daily x 30 tabs Will need to have labs completed before next refill  Orders: -     Synthroid; TAKE 1 TAB (75 MCG) DAILY BEFORE BREAKFAST.  Dispense: 30 tablet; Refill: 0 -     TSH; Future -     Comprehensive metabolic panel; Future  Lipid screening -     Lipid panel; Future    Follow Up Instructions: Return for LAB 2 weeks.  I discussed the assessment and treatment plan with the patient. The patient was provided an opportunity to ask questions and all were answered. The patient agreed with the plan and  demonstrated an understanding of the instructions.   The patient was advised to call back or seek an in-person evaluation if the symptoms worsen or if the condition fails to improve as anticipated.  The above assessment and management plan was discussed with the patient. The patient verbalized understanding of and has agreed to the management plan. Patient is aware to call the clinic if symptoms persist or worsen. Patient is aware when to return to the clinic  for a follow-up visit. Patient educated on when it is appropriate to go to the emergency department.   PDMP reviewed weight Time call ended: 1216  I provided 18 minutes of non-face-to-face time during this encounter.  Carollee Leitz, MD

## 2022-07-25 NOTE — Patient Instructions (Addendum)
It was a pleasure meeting you today. Thank you for allowing me to take part in your health care.  Our goals for today as we discussed include:  Refill Synthroid 75 mcg daily. Refill Zoloft 50 mg daily  You will need to have lab work before your next refill of Synthroid. Please schedule an appointment at the lab in the next 2-3 weeks.  Fast for at least 10 hours   If you have any questions or concerns, please do not hesitate to call the office at [phone].  I look forward to our next visit and until then take care and stay safe.  Regards,   Carollee Leitz, MD   [facility]

## 2022-08-04 ENCOUNTER — Other Ambulatory Visit (INDEPENDENT_AMBULATORY_CARE_PROVIDER_SITE_OTHER): Payer: Medicare HMO

## 2022-08-04 DIAGNOSIS — Z13220 Encounter for screening for lipoid disorders: Secondary | ICD-10-CM

## 2022-08-04 DIAGNOSIS — E039 Hypothyroidism, unspecified: Secondary | ICD-10-CM

## 2022-08-04 LAB — COMPREHENSIVE METABOLIC PANEL
ALT: 18 U/L (ref 0–35)
AST: 16 U/L (ref 0–37)
Albumin: 4.4 g/dL (ref 3.5–5.2)
Alkaline Phosphatase: 75 U/L (ref 39–117)
BUN: 13 mg/dL (ref 6–23)
CO2: 29 mEq/L (ref 19–32)
Calcium: 9.7 mg/dL (ref 8.4–10.5)
Chloride: 105 mEq/L (ref 96–112)
Creatinine, Ser: 0.64 mg/dL (ref 0.40–1.20)
GFR: 81.46 mL/min (ref >60.00)
Glucose, Bld: 85 mg/dL (ref 70–99)
Potassium: 4.3 mEq/L (ref 3.5–5.1)
Sodium: 142 mEq/L (ref 135–145)
Total Bilirubin: 0.7 mg/dL (ref 0.2–1.2)
Total Protein: 6.9 g/dL (ref 6.0–8.3)

## 2022-08-04 LAB — LIPID PANEL
Cholesterol: 151 mg/dL (ref 0–200)
HDL: 59.3 mg/dL (ref >39.00)
LDL Cholesterol: 75 mg/dL (ref 0–99)
NonHDL: 92.06
Total CHOL/HDL Ratio: 3
Triglycerides: 84 mg/dL (ref 0.0–149.0)
VLDL: 16.8 mg/dL (ref 0.0–40.0)

## 2022-08-04 LAB — TSH: TSH: 5.09 u[IU]/mL (ref 0.35–5.50)

## 2022-08-06 ENCOUNTER — Encounter: Payer: Self-pay | Admitting: Family Medicine

## 2022-08-06 DIAGNOSIS — I4821 Permanent atrial fibrillation: Secondary | ICD-10-CM | POA: Insufficient documentation

## 2022-08-06 DIAGNOSIS — I5032 Chronic diastolic (congestive) heart failure: Secondary | ICD-10-CM | POA: Insufficient documentation

## 2022-08-06 DIAGNOSIS — Z13220 Encounter for screening for lipoid disorders: Secondary | ICD-10-CM | POA: Insufficient documentation

## 2022-08-06 HISTORY — DX: Permanent atrial fibrillation: I48.21

## 2022-08-06 NOTE — Assessment & Plan Note (Signed)
Chronic.  Stable. Refill Synthroid 75 mcg's daily x 30 tabs Will need to have labs completed before next refill

## 2022-08-06 NOTE — Assessment & Plan Note (Addendum)
Chronic.  Stable. Refill sertraline 50 mg daily x 90 tabs Will need office visit prior to next refill

## 2022-08-06 NOTE — Assessment & Plan Note (Signed)
Chronic.  Asymptomatic. Follows with cardiology Continue Xarelto 20 mg daily

## 2022-08-06 NOTE — Assessment & Plan Note (Signed)
Chronic. Follows with cardiology Continue metoprolol 25 mg daily

## 2022-08-15 ENCOUNTER — Telehealth: Payer: Self-pay

## 2022-08-15 NOTE — Telephone Encounter (Signed)
Left message to call the office back regarding lab results

## 2022-09-22 ENCOUNTER — Telehealth: Payer: Self-pay | Admitting: Family Medicine

## 2022-09-22 ENCOUNTER — Other Ambulatory Visit: Payer: Self-pay

## 2022-09-22 DIAGNOSIS — E039 Hypothyroidism, unspecified: Secondary | ICD-10-CM

## 2022-09-22 MED ORDER — SYNTHROID 75 MCG PO TABS: ORAL_TABLET | ORAL | 1 refills | Status: DC

## 2022-09-22 NOTE — Telephone Encounter (Signed)
Medication sent to pharmacy per patient request.  Auri Jahnke,cma

## 2022-09-22 NOTE — Telephone Encounter (Signed)
Prescription Request  09/22/2022  Is this a "Controlled Substance" medicine? No  LOV: Visit date not found  What is the name of the medication or equipment?  SYNTHROID 75 MCG tablet   Have you contacted your pharmacy to request a refill? Yes   Which pharmacy would you like this sent to?   [place], [place] - [place] [address] [place] [address] Phone: [phone] Fax: [phone]      Patient notified that their request is being sent to the clinical staff for review and that they should receive a response within 2 business days.   Please advise at Mobile [phone] (mobile)    As per pt, she do not have any more med left, and she will need until her TOC 3/18.

## 2022-10-15 NOTE — Progress Notes (Signed)
SUBJECTIVE:   Chief Complaint  Patient presents with   Transitions Of Care   HPI Presents to clinic transfer to care  Hypothyroidism Currently on Synthroid 50 mcg daily TSH 5.09  A-fib Controlled.  On Xarelto 20 mg daily.  No signs of bleeding.  Follows with cardiology.  History of fall. Patient reports she fell last April.  She was admitted to [facility] for pelvic fracture affecting superior and inferior pubic rami for observation and pain management.  Surgical intervention was indicated.  Was discharged to SNF for physical therapy.  Reports doing well since discharge from SNF.  She would like referral to orthopedics for evaluation.  Urinary incontinence Ongoing issue.  Mostly at night or from sitting to standing position. Denies any fevers, dysuria, hematuria.  And was ordered medication about she never started this due to financial cost.  She would like medication to help with urge incontinence.  EtOH use. Reports no EtOH x 1 year.  Mood disorder Doing well on Zoloft 50 mg daily.  Denies any SI/HI.  Thyroid Asymptomatic.  Currently on levothyroxine 75 mcg daily and compliant with medication.    PERTINENT PMH / PSH: A-fib Hypothyroidism History of fracture Osteopenia Mood disorder  OBJECTIVE:  BP 110/70   Pulse 75   Temp 98.5 F (36.9 C) (Oral)   Ht 5\' 5"  (1.651 m)   Wt 148 lb 6.4 oz (67.3 kg)   SpO2 97%   BMI 24.70 kg/m    Physical Exam Vitals reviewed.  Constitutional:      General: She is not in acute distress.    Appearance: She is normal weight. She is not ill-appearing.  HENT:     Head: Normocephalic.  Eyes:     Conjunctiva/sclera: Conjunctivae normal.  Neck:     Thyroid: No thyromegaly or thyroid tenderness.  Cardiovascular:     Rate and Rhythm: Normal rate and regular rhythm.     Pulses: Normal pulses.  Pulmonary:     Effort: Pulmonary effort is normal.     Breath sounds: Normal breath sounds.  Abdominal:     General: Bowel sounds  are normal.  Musculoskeletal:     Right lower leg: No edema.     Left lower leg: No edema.     Right foot: Normal pulse.     Left foot: Normal pulse.  Neurological:     Mental Status: She is alert. Mental status is at baseline.  Psychiatric:        Mood and Affect: Mood normal.        Behavior: Behavior normal.        Thought Content: Thought content normal.        Judgment: Judgment normal.     ASSESSMENT/PLAN:  Anemia, unspecified type -     CBC with Differential/Platelet  PVD (peripheral vascular disease) (HCC) Assessment & Plan: Chronic.  Bilateral lower extremity edema and Raynaud's syndrome.  Sent lower extremity Doppler did not show normal ABI bilaterally. Previously referred to vascular surgery, 08/2020.  Appointment was canceled as patient felt she did not need.   Chronic heart failure with preserved ejection fraction [facility]) Assessment & Plan: Chronic. Follows with cardiology Continue metoprolol 25 mg daily    Mood disorder [facility]) Assessment & Plan: Chronic.  Stable.  Denies any SI/HI Continue sertraline 50 mg daily     Longstanding persistent atrial fibrillation (HCC) Assessment & Plan: Chronic.  Asymptomatic. Follows with cardiology Continue Xarelto 20 mg daily   Hypothyroidism, unspecified type Assessment &  Plan: Chronic.  Stable.  TSH within normal limits. Continue Synthroid 75 mcg daily   Closed fracture of superior ramus of right pubis, initial encounter [facility]) Assessment & Plan: Asymptomatic.  Completed physical therapy and doing well. Patient requesting orthopedic referral for evaluation.     History of pelvic fracture -     Ambulatory referral to Orthopedic Surgery  Urinary incontinence, unspecified type Assessment & Plan: Chronic. Recommend Kegel exercises. Start Vesicare 5 mg daily Follow-up in 4 weeks.  Orders: -     Solifenacin Succinate; Take 1 tablet (5 mg total) by mouth daily.  Dispense: 30 tablet; Refill:  0   HCM Recommend tetanus booster Medicare annual wellness due Declined shingles vaccine Pneumonia up-to-date DEXA scan up-to-date Colonoscopy every 3 years, last in 2015, history of colon cancer s/p resection 1999.  Was referred in 2022.  Per former PCP note, 05/25/2021, GI declined secondary to age and history of A-fib on Xarelto.  Consider CT abdomen/pelvis in future.  PDMP reviewed  Return in about 4 weeks (around 11/13/2022) for PCP.  Carollee Leitz, MD

## 2022-10-15 NOTE — Patient Instructions (Incomplete)
It was a pleasure meeting you today. Thank you for allowing me to take part in your health care.  Our goals for today as we discussed include:  Continue levothyroxine 50 mcg daily for your thyroid  Follow-up with cardiology as scheduled.  Continue Xarelto 20 mg daily  Will check your hemoglobin today  Sending referral to Orthopedics for follow up fall 10/2021 at your request  Start Vesicare 5 mg daily for overactive bladder.  Use the GoodRx coupon if needed to help with cost.   Schedule follow up appointment in 4 weeks  Schedule appointment for your Medicare annual wellness Tuesday or Thursday afternoons.  Recommend Tetanus Vaccination.  This is given every 10 years.    If you have any questions or concerns, please do not hesitate to call the office at [phone].  I look forward to our next visit and until then take care and stay safe.  Regards,   Carollee Leitz, MD   [facility]

## 2022-10-16 ENCOUNTER — Ambulatory Visit (INDEPENDENT_AMBULATORY_CARE_PROVIDER_SITE_OTHER): Payer: Medicare HMO | Admitting: Family Medicine

## 2022-10-16 VITALS — BP 110/70 | HR 75 | Temp 98.5°F | Ht 65.0 in | Wt 148.4 lb

## 2022-10-16 DIAGNOSIS — I5032 Chronic diastolic (congestive) heart failure: Secondary | ICD-10-CM

## 2022-10-16 DIAGNOSIS — S32511A Fracture of superior rim of right pubis, initial encounter for closed fracture: Secondary | ICD-10-CM

## 2022-10-16 DIAGNOSIS — B3731 Acute candidiasis of vulva and vagina: Secondary | ICD-10-CM | POA: Insufficient documentation

## 2022-10-16 DIAGNOSIS — S32511D Fracture of superior rim of right pubis, subsequent encounter for fracture with routine healing: Secondary | ICD-10-CM

## 2022-10-16 DIAGNOSIS — F39 Unspecified mood [affective] disorder: Secondary | ICD-10-CM | POA: Diagnosis not present

## 2022-10-16 DIAGNOSIS — M545 Low back pain, unspecified: Secondary | ICD-10-CM | POA: Insufficient documentation

## 2022-10-16 DIAGNOSIS — T753XXA Motion sickness, initial encounter: Secondary | ICD-10-CM | POA: Insufficient documentation

## 2022-10-16 DIAGNOSIS — I739 Peripheral vascular disease, unspecified: Secondary | ICD-10-CM | POA: Diagnosis not present

## 2022-10-16 DIAGNOSIS — E039 Hypothyroidism, unspecified: Secondary | ICD-10-CM | POA: Diagnosis not present

## 2022-10-16 DIAGNOSIS — I4811 Longstanding persistent atrial fibrillation: Secondary | ICD-10-CM

## 2022-10-16 DIAGNOSIS — N898 Other specified noninflammatory disorders of vagina: Secondary | ICD-10-CM | POA: Insufficient documentation

## 2022-10-16 DIAGNOSIS — M79609 Pain in unspecified limb: Secondary | ICD-10-CM | POA: Insufficient documentation

## 2022-10-16 DIAGNOSIS — Z8781 Personal history of (healed) traumatic fracture: Secondary | ICD-10-CM

## 2022-10-16 DIAGNOSIS — M25559 Pain in unspecified hip: Secondary | ICD-10-CM | POA: Insufficient documentation

## 2022-10-16 DIAGNOSIS — N952 Postmenopausal atrophic vaginitis: Secondary | ICD-10-CM | POA: Insufficient documentation

## 2022-10-16 DIAGNOSIS — M5430 Sciatica, unspecified side: Secondary | ICD-10-CM | POA: Insufficient documentation

## 2022-10-16 DIAGNOSIS — R32 Unspecified urinary incontinence: Secondary | ICD-10-CM

## 2022-10-16 DIAGNOSIS — M169 Osteoarthritis of hip, unspecified: Secondary | ICD-10-CM | POA: Insufficient documentation

## 2022-10-16 DIAGNOSIS — D649 Anemia, unspecified: Secondary | ICD-10-CM | POA: Diagnosis not present

## 2022-10-16 DIAGNOSIS — R5383 Other fatigue: Secondary | ICD-10-CM | POA: Insufficient documentation

## 2022-10-16 DIAGNOSIS — L309 Dermatitis, unspecified: Secondary | ICD-10-CM | POA: Insufficient documentation

## 2022-10-16 DIAGNOSIS — N3281 Overactive bladder: Secondary | ICD-10-CM

## 2022-10-16 DIAGNOSIS — S139XXA Sprain of joints and ligaments of unspecified parts of neck, initial encounter: Secondary | ICD-10-CM | POA: Insufficient documentation

## 2022-10-16 MED ORDER — SOLIFENACIN SUCCINATE 5 MG PO TABS: 5.0000 mg | ORAL_TABLET | Freq: Every day | ORAL | 0 refills | Status: DC

## 2022-10-20 ENCOUNTER — Encounter: Payer: Self-pay | Admitting: Family Medicine

## 2022-10-20 DIAGNOSIS — D649 Anemia, unspecified: Secondary | ICD-10-CM | POA: Insufficient documentation

## 2022-10-20 DIAGNOSIS — I739 Peripheral vascular disease, unspecified: Secondary | ICD-10-CM | POA: Insufficient documentation

## 2022-10-20 DIAGNOSIS — Z8781 Personal history of (healed) traumatic fracture: Secondary | ICD-10-CM | POA: Insufficient documentation

## 2022-10-20 DIAGNOSIS — N3941 Urge incontinence: Secondary | ICD-10-CM | POA: Insufficient documentation

## 2022-10-20 NOTE — Assessment & Plan Note (Signed)
Chronic.  Bilateral lower extremity edema and Raynaud's syndrome.  Sent lower extremity Doppler did not show normal ABI bilaterally. Previously referred to vascular surgery, 08/2020.  Appointment was canceled as patient felt she did not need.

## 2022-10-20 NOTE — Assessment & Plan Note (Signed)
Chronic. Recommend Kegel exercises. Start Vesicare 5 mg daily Follow-up in 4 weeks.

## 2022-10-20 NOTE — Assessment & Plan Note (Signed)
Asymptomatic.  Completed physical therapy and doing well. Patient requesting orthopedic referral for evaluation.

## 2022-10-20 NOTE — Assessment & Plan Note (Signed)
Chronic.  Asymptomatic. Follows with cardiology Continue Xarelto 20 mg daily 

## 2022-10-20 NOTE — Assessment & Plan Note (Signed)
Chronic.  Stable.  Denies any SI/HI Continue sertraline 50 mg daily

## 2022-10-20 NOTE — Assessment & Plan Note (Signed)
Chronic. Follows with cardiology Continue metoprolol 25 mg daily  

## 2022-10-20 NOTE — Assessment & Plan Note (Signed)
Chronic.  Stable.  TSH within normal limits. Continue Synthroid 75 mcg daily

## 2022-10-30 ENCOUNTER — Other Ambulatory Visit: Payer: Self-pay | Admitting: Physician Assistant

## 2022-11-28 ENCOUNTER — Telehealth: Payer: Self-pay | Admitting: Family Medicine

## 2022-11-28 NOTE — Telephone Encounter (Signed)
Copied from CRM [id number]. Topic: Medicare AWV >> Nov 28, 2022 11:59 AM Rushie Goltz wrote: Reason for CRM: Called patient to schedule Medicare Annual Wellness Visit (AWV). No voicemail available to leave a message.  Last date of AWV: 04/25/2021  Please schedule an AWVS appointment at any time with [facility] VISIT.  If any questions, please contact me at [phone].    Thank you,  [facility] Support [facility] Direct dial  [phone]

## 2022-12-06 ENCOUNTER — Other Ambulatory Visit: Payer: Medicare HMO

## 2022-12-14 ENCOUNTER — Ambulatory Visit: Payer: Medicare HMO | Attending: Physician Assistant

## 2022-12-14 ENCOUNTER — Ambulatory Visit: Payer: Medicare HMO | Admitting: Cardiovascular Disease

## 2022-12-14 NOTE — Progress Notes (Deleted)
Cardiology Office Note   Date:  12/14/2022   ID:  Taylor Mccarthy, DOB 07/28/1941, MRN [id number]  PCP:  Dana Allan, MD  Cardiologist:   Lorine Bears, MD   No chief complaint on file.     History of Present Illness: Taylor Mccarthy is a 82 y.o. female who is here today for follow-up visit regarding permanent atrial fibrillation and moderate mitral and tricuspid regurgitation.    She has known history of hypothyroidism and permanent atrial fibrillation on long-term anticoagulation with Xarelto.  She has been followed in the past by Dr. Nash Shearer.  She had echocardiogram done in December 2018 which with an EF of 55 to 60%, mild LVH, severely dilated left atrium, severely dilated right atrium, moderate to severe mitral regurgitation and severe tricuspid regurgitation.  Estimated pulmonary artery pressure was between 37 to 50 mmHg. She has also history of colon cancer.  Atrial fibrillation is being treated with rate control anticoagulation.  She has been doing reasonably well with no recent chest pain, worsening dyspnea or palpitations.  No side effects with anticoagulation.  She had an echocardiogram done today which was personally reviewed by me and showed normal LV systolic function, severe biatrial enlargement and stable moderate mitral and tricuspid regurgitation with only mild pulmonary hypertension. She reports dark discoloration of both feet but has no claudication.  Past Medical History:  Diagnosis Date   Atrial fibrillation, persistent (HCC)    Cancer (HCC)    colon cancer 1999 s/p resection    History of colon polyps    Insomnia    Low back pain    Mitral regurgitation    Pap smear abnormality of cervix with ASCUS favoring benign    Permanent atrial fibrillation (HCC) 08/06/2022   Sciatica    Vitamin D deficiency     Past Surgical History:  Procedure Laterality Date   COLON RESECTION     1999 for cancer    TONSILLECTOMY     WISDOM TOOTH EXTRACTION       Current  Outpatient Medications  Medication Sig Dispense Refill   acetaminophen (TYLENOL) 500 MG tablet Take 2 tablets (1,000 mg total) by mouth every 8 (eight) hours.     Calcium Carbonate (CALCIUM 600 PO) Take 600 mg by mouth daily.     metoprolol succinate (TOPROL-XL) 50 MG 24 hr tablet TAKE ONE TABLET BY MOUTH EVERY DAY WITH FOOD 90 tablet 0   Multiple Vitamin (MULTIVITAMIN) tablet Take 1 tablet by mouth daily.     rivaroxaban (XARELTO) 20 MG TABS tablet TAKE ONE TABLET BY MOUTH ONCE DAILY WITH SUPPER 90 tablet 1   sertraline (ZOLOFT) 50 MG tablet Take 1 tablet (50 mg total) by mouth daily. 90 tablet 0   solifenacin (VESICARE) 5 MG tablet Take 1 tablet (5 mg total) by mouth daily. 30 tablet 0   SYNTHROID 75 MCG tablet TAKE 1 TAB (75 MCG) DAILY BEFORE BREAKFAST. 90 tablet 1   No current facility-administered medications for this visit.    Allergies:   Morpholine salicylate, Augmentin [amoxicillin-pot clavulanate], Codeine, and Other    Social History:  The patient  reports that she has quit smoking. She has never used smokeless tobacco. She reports that she does not currently use alcohol after a past usage of about 14.0 standard drinks of alcohol per week. She reports that she does not currently use drugs.   Family History:  The patient's family history includes Heart disease in her father; Hyperlipidemia in her father; Osteopenia in  her sister.    ROS:  Please see the history of present illness.   Otherwise, review of systems are positive for none.   All other systems are reviewed and negative.    PHYSICAL EXAM: VS:  There were no vitals taken for this visit. , BMI There is no height or weight on file to calculate BMI. GEN: Well nourished, well developed, in no acute distress  HEENT: normal  Neck: no JVD, carotid bruits, or masses Cardiac: Irregularly irregular; no murmurs, rubs, or gallops,no edema  Respiratory:  clear to auscultation bilaterally, normal work of breathing GI: soft,  nontender, nondistended, + BS MS: no deformity or atrophy  Skin: warm and dry, no rash Neuro:  Strength and sensation are intact Psych: euthymic mood, full affect Some cyanosis in both feet but posterior tibial pulses palpable bilaterally.  EKG:  EKG is ordered today. The ekg ordered today demonstrates atrial fibrillation with ventricular rate of 82 bpm.   Recent Labs: 08/04/2022: ALT 18; BUN 13; Creatinine, Ser 0.64; Potassium 4.3; Sodium 142; TSH 5.09    Lipid Panel    Component Value Date/Time   CHOL 151 08/04/2022 1010   CHOL 198 06/03/2018 1026   TRIG 84.0 08/04/2022 1010   HDL 59.30 08/04/2022 1010   HDL 75 06/03/2018 1026   CHOLHDL 3 08/04/2022 1010   VLDL 16.8 08/04/2022 1010   LDLCALC 75 08/04/2022 1010   LDLCALC 98 06/03/2018 1026      Wt Readings from Last 3 Encounters:  10/16/22 148 lb 6.4 oz (67.3 kg)  07/25/22 158 lb (71.7 kg)  06/13/22 149 lb (67.6 kg)          02/27/2019    2:19 PM  PAD Screen  Previous PAD dx? No  Previous surgical procedure? No  Pain with walking? No  Feet/toe relief with dangling? No  Painful, non-healing ulcers? No  Extremities discolored? No      ASSESSMENT AND PLAN:  1.  Permanent atrial fibrillation: Ventricular rate is more controlled since increasing the dose of Toprol to 50 mg once daily.  She is tolerating anticoagulation.   2.  Mitral regurgitation and tricuspid regurgitation: Repeat echocardiogram today showed stable moderate mitral and tricuspid regurgitation.  3.  Chronic venous insufficiency: Bluish discoloration of both feet is likely due to this.  She has no claudication and she has palpable distal pulses.  I asked her to start wearing knee-high support stockings low pressure during the day.    Disposition:   FU with me in 6 months  Signed,  Lorine Bears, MD  12/14/2022 8:57 AM    [facility]

## 2022-12-15 ENCOUNTER — Encounter: Payer: Self-pay | Admitting: Cardiovascular Disease

## 2023-02-08 ENCOUNTER — Other Ambulatory Visit: Payer: Self-pay | Admitting: Physician Assistant

## 2023-02-11 ENCOUNTER — Other Ambulatory Visit: Payer: Self-pay | Admitting: Cardiovascular Disease

## 2023-02-11 DIAGNOSIS — I4891 Unspecified atrial fibrillation: Secondary | ICD-10-CM

## 2023-02-12 NOTE — Telephone Encounter (Signed)
Refill request

## 2023-02-12 NOTE — Telephone Encounter (Signed)
Prescription refill request for Xarelto received.  Indication: Afib  Last office visit: 06/13/22 Shea Evans)  Weight: 67.3kg Age: 82 Scr: 0.64 (08/04/22)  CrCl: 69.53ml/min  Appropriate dose. Refill sent.

## 2023-02-20 ENCOUNTER — Other Ambulatory Visit: Payer: Medicare HMO

## 2023-02-27 ENCOUNTER — Encounter: Payer: Self-pay | Admitting: Family Medicine

## 2023-02-27 ENCOUNTER — Ambulatory Visit (INDEPENDENT_AMBULATORY_CARE_PROVIDER_SITE_OTHER): Payer: Medicare HMO | Admitting: Family Medicine

## 2023-02-27 VITALS — BP 100/64 | HR 89 | Temp 97.9°F | Resp 16 | Ht 65.0 in | Wt 147.4 lb

## 2023-02-27 DIAGNOSIS — F39 Unspecified mood [affective] disorder: Secondary | ICD-10-CM

## 2023-02-27 DIAGNOSIS — E039 Hypothyroidism, unspecified: Secondary | ICD-10-CM | POA: Diagnosis not present

## 2023-02-27 DIAGNOSIS — M62838 Other muscle spasm: Secondary | ICD-10-CM | POA: Diagnosis not present

## 2023-02-27 DIAGNOSIS — R32 Unspecified urinary incontinence: Secondary | ICD-10-CM

## 2023-02-27 MED ORDER — BACLOFEN 10 MG PO TABS
5.0000 mg | ORAL_TABLET | Freq: Every evening | ORAL | 0 refills | Status: DC | PRN
Start: 2023-02-27 — End: 2023-09-07

## 2023-02-27 MED ORDER — SERTRALINE HCL 50 MG PO TABS: 75.0000 mg | ORAL_TABLET | Freq: Every day | ORAL | 0 refills | Status: DC

## 2023-02-27 MED ORDER — SYNTHROID 75 MCG PO TABS: ORAL_TABLET | ORAL | 1 refills | Status: DC
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
## 2023-02-27 NOTE — Patient Instructions (Signed)
It was a pleasure meeting you today. Thank you for allowing me to take part in your health care.  Our goals for today as we discussed include:  Increase Zoloft to 75 mg daily.   Take Tylenol 500 mg two tablets 3 times a day for arthritic pain Start Baclofen 5 mg at night for muscle spasm in neck Use heat/ice as needed Gentle neck exercises  Recommend Tetanus Vaccination.  This is given every 10 years.   Recommend Shingles vaccine.  This is a 2 dose series and can be given at your local pharmacy.  Please talk to your pharmacist about this.   Schedule Medicare Annual Wellness Visit   Follow up as needed   If you have any questions or concerns, please do not hesitate to call the office at [phone].  I look forward to our next visit and until then take care and stay safe.  Regards,   Dana Allan, MD   [facility]

## 2023-03-11 ENCOUNTER — Encounter: Payer: Self-pay | Admitting: Family Medicine

## 2023-03-11 ENCOUNTER — Telehealth: Payer: Self-pay | Admitting: Family Medicine

## 2023-03-11 DIAGNOSIS — M62838 Other muscle spasm: Secondary | ICD-10-CM | POA: Insufficient documentation

## 2023-03-11 NOTE — Telephone Encounter (Signed)
err

## 2023-03-11 NOTE — Progress Notes (Unsigned)
Cardiology Office Note    Date:  03/12/2023   ID:  Taylor Mccarthy, DOB [birth year], MRN [id number]  PCP:  Dana Allan, MD  Cardiologist:  Lorine Bears, MD  Electrophysiologist:  None   Chief Complaint: Follow up  History of Present Illness:   Taylor Mccarthy is a 82 y.o. female with history of permanent A. fib on Xarelto, HFpEF, moderate mitral and tricuspid regurgitation, frequent PVCs, hypothyroidism, colon cancer, and chronic dizziness/vertigo who presents for follow-up of A-fib.  She was previously followed by Dr. Emily Filbert. Echo in 06/2017 showed and EF of 55-60%, mild LVH, severe biatrial enlargement, moderate to severe mitral regurgitation, severe tricuspid regurgitation, and PASP 37-50 mmHg. She has reported undergoing DCCV twice in the past, which were not successful leading to rate control strategy. She was seen in the office in 01/2019 noting dizziness and lightheadedness. She was noted to have frequent PVCs on 12-lead EKG at that visit. Her Toprol was titrated to 50 mg daily. Echo showed an EF of 55-60%, mild LVH, normal RVSF and cavity size, PASP 39.2 mmHg, severe biatrial enlargement, moderate to severe mitral regurgitation, moderate to severe tricuspid regurgitation. Overall, this was a stable study when compared to 2018.  Echo in 04/2020 showed an EF of 45 to 50%, global hypokinesis, low normal RV systolic function with severely enlarged RV cavity size and normal PASP, severe biatrial enlargement, moderate to severe mitral regurgitation with moderate holosystolic prolapse of both leaflets of the mitral valve, severe tricuspid regurgitation, and an estimated right atrial pressure of 3 mmHg.  This study was personally reviewed by her primary cardiologist and felt to demonstrate normal LV systolic function and a stable moderate mitral and tricuspid regurgitation with only mild pulmonary hypertension.  She was seen in the office in 11/2020 noting bilateral lower extremity paresthesias as well as  a cold lower extremities.   Most recent echo from 06/02/2021 demonstrated a low normal LV systolic function of 50 to 55%, no regional wall motion abnormalities, mild LVH, normal RV systolic function and ventricular cavity size, moderately elevated PASP estimated at 46.8 mmHg, severe biatrial enlargement, moderate to severe mitral regurgitation with moderate late systolic prolapse of both leaflets of the mitral valve, severe tricuspid regurgitation, and an estimated right atrial pressure of 15 mmHg.     She was admitted to the hospital in 10/2021 after sustaining a mechanical fall and was found to have a pelvic fracture with moderate extraperitoneal hemorrhage and hematoma in the pelvis without need for evacuation.  She was evaluated by orthopedic surgery with no indication for surgical intervention.  Hospital course was complicated by mild rhabdomyolysis in the setting of immobility following a fall.  She was last seen in the office in 05/2022 and was without symptoms of angina or cardiac decompensation.  No changes were indicated at that time.  She comes in doing well from a cardiac perspective and is without symptoms of angina or cardiac decompensation.  Chronic dizziness/vertigo persists and is stable.  No palpitations.  No falls.  No bleeding concerns.  No progressive orthopnea or lower extremity swelling.   Labs independently reviewed: 07/2022 - TSH normal, potassium 4.3, BUN 13, SCr 0.64, albumin 4.4, AST/ALT normal, TC 151, TG 84, HDL 59, LDL 75 10/2021 - Hgb 11.4, PLT 145, magnesium 1.9  Past Medical History:  Diagnosis Date   Atrial fibrillation, persistent (HCC)    Cancer (HCC)    colon cancer 1999 s/p resection    History of colon polyps    Insomnia  Low back pain    Mitral regurgitation    Pap smear abnormality of cervix with ASCUS favoring benign    Permanent atrial fibrillation (HCC) 08/06/2022   Sciatica    Vitamin D deficiency     Past Surgical History:  Procedure  Laterality Date   COLON RESECTION     1999 for cancer    TONSILLECTOMY     WISDOM TOOTH EXTRACTION      Current Medications: Current Meds  Medication Sig   acetaminophen (TYLENOL) 500 MG tablet Take 2 tablets (1,000 mg total) by mouth every 8 (eight) hours.   baclofen (LIORESAL) 10 MG tablet Take 0.5 tablets (5 mg total) by mouth at bedtime as needed for muscle spasms.   Calcium Carbonate (CALCIUM 600 PO) Take 600 mg by mouth daily.   Multiple Vitamin (MULTIVITAMIN) tablet Take 1 tablet by mouth daily.   sertraline (ZOLOFT) 50 MG tablet Take 1.5 tablets (75 mg total) by mouth daily.   solifenacin (VESICARE) 5 MG tablet Take 1 tablet (5 mg total) by mouth daily.   SYNTHROID 75 MCG tablet TAKE 1 TAB (75 MCG) DAILY BEFORE BREAKFAST.   XARELTO 20 MG TABS tablet TAKE ONE TABLET BY MOUTH ONCE DAILY WITH SUPPER   [DISCONTINUED] metoprolol succinate (TOPROL-XL) 50 MG 24 hr tablet TAKE ONE TABLET BY MOUTH EVERY DAY WITH FOOD - Must keep scheduled appt in Aug for further refills    Allergies:   Morpholine salicylate, Augmentin [amoxicillin-pot clavulanate], Codeine, and Other   Social History   Socioeconomic History   Marital status: Divorced    Spouse name: Not on file   Number of children: Not on file   Years of education: Not on file   Highest education level: Not on file  Occupational History   Not on file  Tobacco Use   Smoking status: Former   Smokeless tobacco: Never   Tobacco comments:    former quit in 30s light smoker per pt   Vaping Use   Vaping status: Never Used  Substance and Sexual Activity   Alcohol use: Not Currently    Alcohol/week: 14.0 standard drinks of alcohol    Types: 14 Glasses of wine per week    Comment: 2 glasses at night   Drug use: Not Currently   Sexual activity: Not Currently  Other Topics Concern   Not on file  Social History Narrative   Divorced    College grad    Moved from [place] to [place]   Former Manufacturing systems engineer    No guns   Wears seat belts    Social Determinants of Health   Financial Resource Strain: Low Risk  (04/25/2021)   Overall Financial Resource Strain (CARDIA)    Difficulty of Paying Living Expenses: Not hard at all  Food Insecurity: No Food Insecurity (04/25/2021)   Hunger Vital Sign    Worried About Running Out of Food in the Last Year: Never true    Ran Out of Food in the Last Year: Never true  Transportation Needs: No Transportation Needs (04/25/2021)   PRAPARE - Administrator, Civil Service (Medical): No    Lack of Transportation (Non-Medical): No  Physical Activity: Sufficiently Active (04/25/2021)   Exercise Vital Sign    Days of Exercise per Week: 3 days    Minutes of Exercise per Session: 60 min  Stress: No Stress Concern Present (04/25/2021)   [employer] of Occupational Health - Occupational Stress Questionnaire    Feeling of  Stress : Not at all  Social Connections: Unknown (04/25/2021)   Social Connection and Isolation Panel [NHANES]    Frequency of Communication with Friends and Family: More than three times a week    Frequency of Social Gatherings with Friends and Family: More than three times a week    Attends Religious Services: Not on Marketing executive or Organizations: Not on file    Attends Banker Meetings: Not on file    Marital Status: Not on file     Family History:  The patient's family history includes Heart disease in her father; Hyperlipidemia in her father; Osteopenia in her sister.  ROS:   12-point review of systems is negative unless otherwise noted in the HPI.   EKGs/Labs/Other Studies Reviewed:    Studies reviewed were summarized above. The additional studies were reviewed today:  2D echo 03/12/2023: Pending __________  2D echo 06/02/2021: 1. Left ventricular ejection fraction, by estimation, is 50 to 55%. The  left ventricle has low normal function. The left ventricle has no regional   wall motion abnormalities. There is mild left ventricular hypertrophy.  Left ventricular diastolic  parameters are consistent with Grade II diastolic dysfunction  (pseudonormalization).   2. Right ventricular systolic function is normal. The right ventricular  size is normal. There is moderately elevated pulmonary artery systolic  pressure. The estimated right ventricular systolic pressure is 46.8 mmHg.   3. Left atrial size was severely dilated.   4. Right atrial size was severely dilated.   5. The mitral valve is normal in structure. Moderate to severe mitral  valve regurgitation. No evidence of mitral stenosis. There is moderate  late systolic prolapse of both leaflets of the mitral valve.   6. Tricuspid valve regurgitation is severe.   7. The aortic valve is normal in structure. Aortic valve regurgitation is  not visualized. No aortic stenosis is present.   8. The inferior vena cava is dilated in size with <50% respiratory  variability, suggesting right atrial pressure of 15 mmHg.   Comparison(s): LVEF 45-50%, Mod-Sev MR, MVP, Sev TR. __________   2D echo 05/18/2020: 1. Left ventricular ejection fraction, by estimation, is 45 to 50%. The  left ventricle has mildly decreased function. The left ventricle  demonstrates global hypokinesis. There is mild left ventricular  hypertrophy. Left ventricular diastolic parameters  are indeterminate.   2. Right ventricular systolic function is low normal. The right  ventricular size is severely enlarged. There is normal pulmonary artery  systolic pressure.   3. Left atrial size was severely dilated.   4. Right atrial size was severely dilated.   5. The mitral valve is abnormal. Moderate to severe mitral valve  regurgitation. There is moderate holosystolic prolapse of both leaflets of  the mitral valve.   6. Tricuspid valve regurgitation is severe.   7. The aortic valve is tricuspid. Aortic valve regurgitation is not  visualized. No  aortic stenosis is present.   8. The inferior vena cava is normal in size with greater than 50%  respiratory variability, suggesting right atrial pressure of 3 mmHg. __________   2D echo 03/27/2019: 1. The left ventricle has normal systolic function, with an ejection  fraction of 55-60%. The cavity size was normal. There is mildly increased  left ventricular wall thickness. Left ventricular diastolic Doppler  parameters are indeterminate.   2. The right ventricle has normal systolic function. The cavity was  normal. There is no increase in right ventricular  wall thickness. Right  ventricular systolic pressure is mildly elevated with an estimated  pressure of 39.2 mmHg.   3. Left atrial size was severely dilated.   4. Right atrial size was severely dilated.   5. Mitral valve regurgitation is moderate to severe   6. Tricuspid valve regurgitation is moderate-severe.   7. Rhythm appears to be atrial fibrillation   8. Details above appear consistent with prior study dated 06/2017   EKG:  EKG is ordered today.  The EKG ordered today demonstrates Afib with slow ventricular response, 57 bpm, nonspecific st/t changes consistent with prior changes  Recent Labs: 08/04/2022: ALT 18; BUN 13; Creatinine, Ser 0.64; Potassium 4.3; Sodium 142; TSH 5.09  Recent Lipid Panel    Component Value Date/Time   CHOL 151 08/04/2022 1010   CHOL 198 06/03/2018 1026   TRIG 84.0 08/04/2022 1010   HDL 59.30 08/04/2022 1010   HDL 75 06/03/2018 1026   CHOLHDL 3 08/04/2022 1010   VLDL 16.8 08/04/2022 1010   LDLCALC 75 08/04/2022 1010   LDLCALC 98 06/03/2018 1026    PHYSICAL EXAM:    VS:  BP 116/78 (BP Location: Left Arm, Patient Position: Sitting, Cuff Size: Normal)   Pulse (!) 57   Ht 5\' 5"  (1.651 m)   Wt 144 lb 6.4 oz (65.5 kg)   SpO2 97%   BMI 24.03 kg/m   BMI: Body mass index is 24.03 kg/m.  Physical Exam Vitals reviewed.  Constitutional:      Appearance: She is well-developed.  HENT:     Head:  Normocephalic and atraumatic.  Eyes:     General:        Right eye: No discharge.        Left eye: No discharge.  Neck:     Vascular: No JVD.  Cardiovascular:     Rate and Rhythm: Bradycardia present. Rhythm irregularly irregular.     Pulses:          Posterior tibial pulses are 2+ on the right side and 2+ on the left side.     Heart sounds: S1 normal and S2 normal. Heart sounds not distant. No midsystolic click and no opening snap. Murmur heard.     Systolic murmur is present with a grade of 1/6 at the apex.     No friction rub.  Pulmonary:     Effort: Pulmonary effort is normal. No respiratory distress.     Breath sounds: Normal breath sounds. No decreased breath sounds, wheezing or rales.  Chest:     Chest wall: No tenderness.  Abdominal:     General: There is no distension.  Musculoskeletal:     Cervical back: Normal range of motion.     Right lower leg: No edema.     Left lower leg: No edema.     Comments: Hyperpigmentation consistent with chronic venous insufficiency.  Skin:    General: Skin is warm and dry.     Nails: There is no clubbing.  Neurological:     Mental Status: She is alert and oriented to person, place, and time.  Psychiatric:        Speech: Speech normal.        Behavior: Behavior normal.        Thought Content: Thought content normal.        Judgment: Judgment normal.     Wt Readings from Last 3 Encounters:  03/12/23 144 lb 6.4 oz (65.5 kg)  02/27/23 147 lb 6 oz (66.8 kg)  10/16/22 148 lb 6.4 oz (67.3 kg)     ASSESSMENT & PLAN:   Permanent A-fib: Ventricular rates mildly bradycardic.  With chronic dizziness, we will undergo a trial of reducing Toprol-XL to 25 mg daily.  CHA2DS2-VASc at least 4 (CHF, age x2, sex category).  She remains on rivaroxaban 20 mg daily and does not meet reduced dosing criteria with a creatinine clearance of 67.  No symptoms concerning for bleeding or falls.  Recent labs stable.  HFpEF: Euvolemic and well compensated.   Not currently requiring a standing diuretic.  Defer addition of MRA or SGLT2 inhibitor in the setting of relative hypotension with chronic dizziness.  Mitral and tricuspid regurgitation: Stable on most recent echo.  Await echo performed earlier today.  Chronic venous insufficiency: No symptoms consistent with claudication. Lower extremity imaging previously normal. Recommend leg elevation and compression stockings.    Disposition: F/u with Dr. Kirke Corin or an APP in 6 months.   Medication Adjustments/Labs and Tests Ordered: Current medicines are reviewed at length with the patient today.  Concerns regarding medicines are outlined above. Medication changes, Labs and Tests ordered today are summarized above and listed in the Patient Instructions accessible in Encounters.   Signed, Eula Listen, PA-C 03/12/2023 3:05 PM     [facility] [place] [address] [place], [place] [address] [phone]

## 2023-03-11 NOTE — Assessment & Plan Note (Signed)
Chronic.  Stable. Refill Synthroid 75 mcg daily

## 2023-03-11 NOTE — Assessment & Plan Note (Signed)
Suspect muscle spasm given tension in cervical area. Will trial baclofen 5 mg nightly Avoid NSAIDs given currently on anticoagulation Generalized muscle aches likely due to recent passing of her friend and progression of mood disorder. Can continue Tylenol 500 mg 3 times daily Follow-up if no improvement in symptoms.

## 2023-03-11 NOTE — Assessment & Plan Note (Addendum)
Chronic. PHQ9 positive for question 9, however denied SI during visit.  No active plan.   Increase sertraline 75 mg daily  Follow-up in 2 weeks

## 2023-03-12 ENCOUNTER — Ambulatory Visit: Payer: Medicare HMO | Attending: Physician Assistant

## 2023-03-12 ENCOUNTER — Encounter: Payer: Self-pay | Admitting: Physician Assistant

## 2023-03-12 ENCOUNTER — Ambulatory Visit: Payer: Medicare HMO | Admitting: Physician Assistant

## 2023-03-12 VITALS — BP 116/78 | HR 57 | Ht 65.0 in | Wt 144.4 lb

## 2023-03-12 DIAGNOSIS — I4821 Permanent atrial fibrillation: Secondary | ICD-10-CM | POA: Diagnosis not present

## 2023-03-12 DIAGNOSIS — I872 Venous insufficiency (chronic) (peripheral): Secondary | ICD-10-CM | POA: Diagnosis not present

## 2023-03-12 DIAGNOSIS — I340 Nonrheumatic mitral (valve) insufficiency: Secondary | ICD-10-CM

## 2023-03-12 DIAGNOSIS — I5032 Chronic diastolic (congestive) heart failure: Secondary | ICD-10-CM

## 2023-03-12 DIAGNOSIS — I071 Rheumatic tricuspid insufficiency: Secondary | ICD-10-CM | POA: Diagnosis not present

## 2023-03-12 LAB — ECHOCARDIOGRAM COMPLETE
MV M vel: 5.47 m/s
MV Peak grad: 119.7 mmHg
Radius: 0.6 cm
S' Lateral: 3.3 cm

## 2023-03-12 MED ORDER — METOPROLOL SUCCINATE ER 50 MG PO TB24: 25.0000 mg | ORAL_TABLET | Freq: Every day | ORAL | 3 refills | Status: DC

## 2023-03-12 NOTE — Patient Instructions (Addendum)
Medication Instructions:  Your physician recommends the following medication changes.  DECREASE: Metoprolol Succinate  25 mg daily (half of 50 mg tablet)   *If you need a refill on your cardiac medications before your next appointment, please call your pharmacy*   Lab Work: NONE   Testing/Procedures: NONE   Follow-Up: At [facility], you and your health needs are our priority.  As part of our continuing mission to provide you with exceptional heart care, we have created designated Provider Care Teams.  These Care Teams include your primary Cardiologist (physician) and Advanced Practice Providers (APPs -  Physician Assistants and Nurse Practitioners) who all work together to provide you with the care you need, when you need it.  We recommend signing up for the patient portal called "MyChart".  Sign up information is provided on this After Visit Summary.  MyChart is used to connect with patients for Virtual Visits (Telemedicine).  Patients are able to view lab/test results, encounter notes, upcoming appointments, etc.  Non-urgent messages can be sent to your provider as well.   To learn more about what you can do with MyChart, go to [email or web address].    Your next appointment:   6 month(s)  Provider:   You may see Lorine Bears, MD or one of the following Advanced Practice Providers on your designated Care Team:    Eula Listen, [place]

## 2023-03-19 ENCOUNTER — Telehealth: Payer: Self-pay

## 2023-03-19 DIAGNOSIS — R269 Unspecified abnormalities of gait and mobility: Secondary | ICD-10-CM

## 2023-03-19 NOTE — Telephone Encounter (Signed)
Patient states she has already spoken to Dr. Dana Allan regarding a referral for physical therapy.  Patient states she would like for Dr. Clent Ridges to send the referral to:  [facility] - fax:  [phone]

## 2023-03-20 ENCOUNTER — Telehealth: Payer: Self-pay | Admitting: *Deleted

## 2023-03-20 NOTE — Telephone Encounter (Signed)
Left voicemail message to call back for review of results.  

## 2023-03-20 NOTE — Telephone Encounter (Signed)
-----   Message from [employer] sent at 03/13/2023  7:58 AM EDT ----- Echo showed normal pump function, normal wall motion, normal pressure in the right side of the heart, moderately to severely leaky mitral valve with moderate prolapse, and a severely leaky tricuspid valve when compared to prior echoes dating back to 2021 leaky valves are stable and pressure in the heart is improved.  Continue to monitor.

## 2023-03-22 ENCOUNTER — Telehealth: Payer: Self-pay | Admitting: Family Medicine

## 2023-03-22 NOTE — Telephone Encounter (Signed)
Patient just called and said she has questions about her well-being, and she's not feeling well. She also states she has Vertigo. Could someone call her at [phone]. She would like for someone to call her today.

## 2023-03-22 NOTE — Telephone Encounter (Signed)
Made pt an appointment for Monday @ 11.

## 2023-03-23 NOTE — Telephone Encounter (Signed)
Referral placed.

## 2023-03-26 ENCOUNTER — Encounter: Payer: Self-pay | Admitting: Family Medicine

## 2023-03-26 ENCOUNTER — Ambulatory Visit (INDEPENDENT_AMBULATORY_CARE_PROVIDER_SITE_OTHER): Payer: Medicare HMO | Admitting: Family Medicine

## 2023-03-26 VITALS — BP 110/64 | HR 79 | Temp 98.0°F | Resp 16 | Ht 65.0 in | Wt 139.0 lb

## 2023-03-26 DIAGNOSIS — T7840XD Allergy, unspecified, subsequent encounter: Secondary | ICD-10-CM | POA: Diagnosis not present

## 2023-03-26 DIAGNOSIS — R42 Dizziness and giddiness: Secondary | ICD-10-CM

## 2023-03-26 DIAGNOSIS — R2681 Unsteadiness on feet: Secondary | ICD-10-CM

## 2023-03-26 DIAGNOSIS — F39 Unspecified mood [affective] disorder: Secondary | ICD-10-CM

## 2023-03-26 MED ORDER — FLUTICASONE PROPIONATE 50 MCG/ACT NA SUSP
2.0000 | Freq: Every day | NASAL | 6 refills | Status: DC
Start: 2023-03-26 — End: 2023-09-07

## 2023-03-26 MED ORDER — SERTRALINE HCL 100 MG PO TABS
100.0000 mg | ORAL_TABLET | Freq: Every day | ORAL | 1 refills | Status: DC
Start: 2023-03-26 — End: 2023-09-07

## 2023-03-26 MED ORDER — FEXOFENADINE HCL 180 MG PO TABS
180.0000 mg | ORAL_TABLET | Freq: Every day | ORAL | 2 refills | Status: AC
Start: 2023-03-26 — End: ?

## 2023-03-26 NOTE — Assessment & Plan Note (Signed)
Chronic. No change in PHQ9 positive for question 9, however continues to deny SI during visit.  No active plan.   Increase sertraline 100 mg daily  Follow-up in 4 weeks Offered psychiatry referral, patient declined

## 2023-03-26 NOTE — Progress Notes (Signed)
SUBJECTIVE:   Chief Complaint  Patient presents with   Dizziness   Nasal Congestion   HPI Presents to clinic for acute visit  Concerned for dizziness and nausea Has had longstanding history of dizziness for years.  Worse when standing.  Has recently had some sinus headache and reports thought was developing sinus infection. Denies any fevers, nasal congestion or cough.  Has had some rhinorrhea.  No recent URI or sick contacts.  Reports vertigo when in a vertical position. Denies any weakness, slurred speech, facial droop.  Does endorse some balance issues that have been long term.  Recently increased Zoloft from 50 to 75 mg.  Has been having some nausea but not sure if related to increase in medication.  Reports no change in mood since dose increase.  Continues to have stress at home, sister is not doing well and is currently in hospice, patient reports she does not like her current living situation and is living alone.  She also endorses that she does not have any SI/HI.  Thinks maybe the dizziness and vertigo may be psychosomatic.  Would like to increase dose of Zoloft.  PERTINENT PMH / PSH: Mood disorder Chronic vertigo Chronic sinusitis  OBJECTIVE:  BP 110/64   Pulse 79   Temp 98 F (36.7 C)   Resp 16   Ht 5\' 5"  (1.651 m)   Wt 139 lb (63 kg)   SpO2 95%   BMI 23.13 kg/m    Physical Exam Vitals reviewed.  Constitutional:      General: She is not in acute distress.    Appearance: Normal appearance. She is normal weight. She is not ill-appearing, toxic-appearing or diaphoretic.  HENT:     Head: Normocephalic.     Right Ear: There is impacted cerumen.     Left Ear: Tympanic membrane normal.     Nose: No nasal deformity, septal deviation, nasal tenderness, mucosal edema, congestion or rhinorrhea.     Right Nostril: No occlusion.     Left Nostril: No occlusion.     Right Turbinates: Not enlarged or swollen.     Left Turbinates: Not enlarged or swollen.     Right Sinus:  No maxillary sinus tenderness or frontal sinus tenderness.     Left Sinus: No maxillary sinus tenderness or frontal sinus tenderness.  Eyes:     General:        Right eye: No discharge.        Left eye: No discharge.     Extraocular Movements: Extraocular movements intact.     Conjunctiva/sclera: Conjunctivae normal.     Pupils: Pupils are equal, round, and reactive to light.  Cardiovascular:     Rate and Rhythm: Normal rate.  Pulmonary:     Effort: Pulmonary effort is normal.  Musculoskeletal:        General: Normal range of motion.  Skin:    General: Skin is warm and dry.  Neurological:     General: No focal deficit present.     Mental Status: She is alert and oriented to person, place, and time. Mental status is at baseline.     Cranial Nerves: No cranial nerve deficit.     Motor: No weakness.     Coordination: Coordination normal.     Gait: Gait normal.  Psychiatric:        Mood and Affect: Mood normal.        Behavior: Behavior normal.        Thought Content: Thought content  normal.        Judgment: Judgment normal.        03/26/2023   11:11 AM 02/27/2023   10:41 AM 10/16/2022    3:07 PM 07/25/2022   11:56 AM 04/25/2021    2:08 PM  Depression screen PHQ 2/9  Decreased Interest 3 3 0 0 0  Down, Depressed, Hopeless 3 3 0 0 0  PHQ - 2 Score 6 6 0 0 0  Altered sleeping 3 3     Tired, decreased energy 3 3     Change in appetite 3 3     Feeling bad or failure about yourself  3 3     Trouble concentrating 3      Moving slowly or fidgety/restless 3 2     Suicidal thoughts 3 1     PHQ-9 Score 27 21     Difficult doing work/chores Extremely dIfficult Somewhat difficult         03/26/2023   11:12 AM 02/27/2023   10:41 AM 10/16/2022    3:07 PM  GAD 7 : Generalized Anxiety Score  Nervous, Anxious, on Edge 3 3 0  Control/stop worrying 3 3 0  Worry too much - different things 3 3 0  Trouble relaxing 3 3 0  Restless 3 3 0  Easily annoyed or irritable 3 3 0  Afraid -  awful might happen 3 3 0  Total GAD 7 Score 21 21 0  Anxiety Difficulty Extremely difficult Extremely difficult Not difficult at all    ASSESSMENT/PLAN:  Allergy, subsequent encounter Assessment & Plan: Possibly seasonal allergies.  No signs of sinusitis. Start Flonase 2 sprays daily Start Allegra 180 mg daily Recent CT head 2023 did not indicate chronic sinusitis. Consider ENT referral if no improvement    Orders: -     Fexofenadine HCl; Take 1 tablet (180 mg total) by mouth daily.  Dispense: 30 tablet; Refill: 2 -     Fluticasone Propionate; Place 2 sprays into both nostrils daily.  Dispense: 16 g; Refill: 6  Mood disorder (HCC) Assessment & Plan: Chronic. No change in PHQ9 positive for question 9, however continues to deny SI during visit.  No active plan.   Increase sertraline 100 mg daily  Follow-up in 4 weeks Offered psychiatry referral, patient declined  Orders: -     Sertraline HCl; Take 1 tablet (100 mg total) by mouth daily.  Dispense: 90 tablet; Refill: 1  Unsteadiness Assessment & Plan: Chronic Uses cane Referral has been sent to PT   Vertigo Assessment & Plan: Chronic.  Has not improved Orthostatic vitals negative Consider ENT referral Has previously been referred to Neurology Will trial Flonase and Allegra to see if any improvement     PDMP reviewed  Return if symptoms worsen or fail to improve, for PCP.  Dana Allan, MD

## 2023-03-26 NOTE — Assessment & Plan Note (Addendum)
Chronic.  Has not improved Orthostatic vitals negative Consider ENT referral Has previously been referred to Neurology Will trial Flonase and Allegra to see if any improvement

## 2023-03-26 NOTE — Assessment & Plan Note (Signed)
Possibly seasonal allergies.  No signs of sinusitis. Start Flonase 2 sprays daily Start Allegra 180 mg daily Recent CT head 2023 did not indicate chronic sinusitis. Consider ENT referral if no improvement

## 2023-03-26 NOTE — Assessment & Plan Note (Signed)
Chronic Uses cane Referral has been sent to PT

## 2023-03-26 NOTE — Patient Instructions (Addendum)
It was a pleasure meeting you today. Thank you for allowing me to take part in your health care.  Our goals for today as we discussed include:  Blood pressure is good  Increase zoloft to 100 mg daily  Start Flonase 2 spray to each nostril daily Start Allegra 180 mg daily   Use Debrox 5 drops to right ear twice a day for 5 -7 days  Follow up in 4 weeks  If you have any questions or concerns, please do not hesitate to call the office at [phone].  I look forward to our next visit and until then take care and stay safe.  Regards,   Dana Allan, MD   [facility]

## 2023-03-27 NOTE — Telephone Encounter (Signed)
Pt called in to let Dr. Clent Ridges knows that the meds she got yesterday from her its working very good. Thank you so much Dr. Clent Ridges.

## 2023-03-29 NOTE — Telephone Encounter (Signed)
Left voicemail message to call back for review of results.  

## 2023-04-03 ENCOUNTER — Encounter: Payer: Self-pay | Admitting: *Deleted

## 2023-04-03 NOTE — Telephone Encounter (Signed)
Left voicemail message to call back for review of results and advised I would send letter with this information as well.   Attempt # 3 so will send letter.

## 2023-04-08 ENCOUNTER — Encounter: Payer: Self-pay | Admitting: Family Medicine

## 2023-04-11 ENCOUNTER — Telehealth: Payer: Self-pay

## 2023-04-11 NOTE — Telephone Encounter (Signed)
Cassie called from [facility] to state they received a referral for patient to have physical therapy.  Cassie states she has called patient to schedule three times since mid-August, leaving voicemails each time, and patient has not returned her calls. Cassie would like to let us know that patient is not receiving physical therapy at this time and she is unable to reach patient.

## 2023-08-27 ENCOUNTER — Ambulatory Visit: Payer: Medicare HMO

## 2023-08-27 ENCOUNTER — Ambulatory Visit: Payer: Medicare HMO | Admitting: Family Medicine

## 2023-08-27 ENCOUNTER — Telehealth: Payer: Self-pay | Admitting: Family Medicine

## 2023-08-27 NOTE — Telephone Encounter (Signed)
Provider out sick called patient and left VM to call office to r/s appointment

## 2023-08-28 ENCOUNTER — Ambulatory Visit: Payer: Medicare HMO | Admitting: Family Medicine

## 2023-09-07 ENCOUNTER — Encounter: Payer: Self-pay | Admitting: Family Medicine

## 2023-09-07 ENCOUNTER — Ambulatory Visit (INDEPENDENT_AMBULATORY_CARE_PROVIDER_SITE_OTHER): Payer: Medicare HMO | Admitting: Family Medicine

## 2023-09-07 ENCOUNTER — Telehealth: Payer: Self-pay

## 2023-09-07 VITALS — BP 102/68 | HR 75 | Temp 98.3°F | Resp 18 | Ht 65.0 in | Wt 148.5 lb

## 2023-09-07 DIAGNOSIS — I739 Peripheral vascular disease, unspecified: Secondary | ICD-10-CM

## 2023-09-07 DIAGNOSIS — S32511S Fracture of superior rim of right pubis, sequela: Secondary | ICD-10-CM

## 2023-09-07 DIAGNOSIS — R32 Unspecified urinary incontinence: Secondary | ICD-10-CM

## 2023-09-07 DIAGNOSIS — M1990 Unspecified osteoarthritis, unspecified site: Secondary | ICD-10-CM | POA: Diagnosis not present

## 2023-09-07 DIAGNOSIS — N3281 Overactive bladder: Secondary | ICD-10-CM

## 2023-09-07 DIAGNOSIS — R768 Other specified abnormal immunological findings in serum: Secondary | ICD-10-CM

## 2023-09-07 DIAGNOSIS — E039 Hypothyroidism, unspecified: Secondary | ICD-10-CM | POA: Diagnosis not present

## 2023-09-07 DIAGNOSIS — M2550 Pain in unspecified joint: Secondary | ICD-10-CM

## 2023-09-07 DIAGNOSIS — F39 Unspecified mood [affective] disorder: Secondary | ICD-10-CM

## 2023-09-07 MED ORDER — SERTRALINE HCL 100 MG PO TABS: 100.0000 mg | ORAL_TABLET | Freq: Every day | ORAL | 1 refills | Status: DC

## 2023-09-07 MED ORDER — SOLIFENACIN SUCCINATE 10 MG PO TABS: 10.0000 mg | ORAL_TABLET | Freq: Every day | ORAL | 0 refills | Status: DC

## 2023-09-07 NOTE — Telephone Encounter (Signed)
 Patient states at check-out that she was unable to give a urine sample today.

## 2023-09-07 NOTE — Progress Notes (Signed)
 SUBJECTIVE:   Chief Complaint  Patient presents with   Arthritis    Waist up became worse in the last 6 months   HPI Presents for acute visit  Discussed the use of AI scribe software for clinical note transcription with the patient, who gave verbal consent to proceed.  History of Present Illness Taylor Mccarthy is an 83 year old female who presents with difficulty walking and arthritis pain. She is accompanied by her daughter, who has power of attorney and medical authority.  She has been experiencing significant difficulty walking since a severe accident two and a half years ago, which resulted in four pelvic fractures. She has not fully recovered and continues to experience pain while walking.  She suffers from debilitating arthritis pain, particularly affecting her hands and upper body. 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 She describes her digital skills as limited and experiences pain with any touch. There are no flare-ups, fevers, swelling, or redness in her joints, but there is constant pain that persists 24 hours a day. She is currently using Tylenol  prophylactically, which she states is not effective.  She is dealing with incontinence, which she suspects may be related to her fall. She has not been taking VESIcare  regularly as she did not notice any improvement and was unsure of the medication in her pill collection.  Her daughter provides additional context, noting a family history of thyroid  issues and expressing concern about potential under-medication for hypothyroidism. She has been on Synthroid  and has a history of Hashimoto's thyroiditis since 2006. The daughter also notes that she has experienced memory issues and vertigo, which she believes could be related to thyroid  dysfunction.      PERTINENT PMH / PSH: As above  OBJECTIVE:  BP 102/68   Pulse 75   Temp 98.3 F (36.8 C)   Resp 18   Ht  5' 5 (1.651 m)   Wt 148 lb 8 oz (67.4 kg)   SpO2 96%   BMI 24.71 kg/m    Physical Exam Vitals reviewed.  Constitutional:      General: She is not in acute distress.    Appearance: Normal appearance. She is normal weight. She is not ill-appearing, toxic-appearing or diaphoretic.  Eyes:     General:        Right eye: No discharge.        Left eye: No discharge.     Conjunctiva/sclera: Conjunctivae normal.  Cardiovascular:     Rate and Rhythm: Normal rate.  Pulmonary:     Effort: Pulmonary effort is normal.  Abdominal:     General: Bowel sounds are normal.  Musculoskeletal:        General: Tenderness present. No swelling. Normal range of motion.  Skin:    General: Skin is warm and dry.  Neurological:     General: No focal deficit present.     Mental Status: She is alert and oriented to person, place, and time. Mental status is at baseline.  Psychiatric:        Mood and Affect: Mood normal.        Behavior: Behavior normal.        Thought Content: Thought content normal.        Judgment: Judgment normal.           09/07/2023    1:58 PM 03/26/2023   11:11 AM 02/27/2023   10:41 AM 10/16/2022  3:07 PM 07/25/2022   11:56 AM  Depression screen PHQ 2/9  Decreased Interest 0 3 3 0 0  Down, Depressed, Hopeless 1 3 3  0 0  PHQ - 2 Score 1 6 6  0 0  Altered sleeping 0 3 3    Tired, decreased energy 0 3 3    Change in appetite 0 3 3    Feeling bad or failure about yourself  0 3 3    Trouble concentrating 0 3     Moving slowly or fidgety/restless 0 3 2    Suicidal thoughts 0 3 1    PHQ-9 Score 1 27 21     Difficult doing work/chores Not difficult at all Extremely dIfficult Somewhat difficult        09/07/2023    1:59 PM 03/26/2023   11:12 AM 02/27/2023   10:41 AM 10/16/2022    3:07 PM  GAD 7 : Generalized Anxiety Score  Nervous, Anxious, on Edge 0 3 3 0  Control/stop worrying 0 3 3 0  Worry too much - different things 0 3 3 0  Trouble relaxing 0 3 3 0  Restless 0 3 3 0   Easily annoyed or irritable 0 3 3 0  Afraid - awful might happen 0 3 3 0  Total GAD 7 Score 0 21 21 0  Anxiety Difficulty Not difficult at all Extremely difficult Extremely difficult Not difficult at all    ASSESSMENT/PLAN:  Hypothyroidism, unspecified type Assessment & Plan: Currently on Synthroid . Discussed potential link between thyroid  function and joint pain, anxiety, and other symptoms. -Order TSH, T3, T4,rT3, TPOAB, TrAB, ThyAB at daughters request -Continue Synthroid  75 mcg daily -Consider referral to Endocrinology based on lab results.  Orders: -     TSH+T3+ThyAbs+TPO Ab+TRAb+T... -     T3, reverse -     Comprehensive metabolic panel  Polyarthralgia -     CBC with Differential/Platelet -     Sedimentation rate -     C-reactive protein -     ANA -     Protein electrophoresis, serum -     Protein Electrophoresis, Urine Rflx. -     Ambulatory referral to Physical Therapy -     Comprehensive metabolic panel  OAB (overactive bladder) -     Ambulatory referral to Physical Therapy -     Solifenacin  Succinate; Take 1 tablet (10 mg total) by mouth daily.  Dispense: 30 tablet; Refill: 0  Mood disorder (HCC) Assessment & Plan: Currently managed with Zoloft . Discussed potential link between thyroid  function and mood. -Continue Zoloft  100 mg daily   Orders: -     Sertraline  HCl; Take 1 tablet (100 mg total) by mouth daily.  Dispense: 90 tablet; Refill: 1  Arthritis Assessment & Plan: Constant pain in hands, affecting daily activities and quality of life. No reported flare-ups, fevers, swelling, or redness. Currently treating with Tylenol  with minimal relief. Discussed possibility of rheumatological cause. -Order blood work to rule out rheumatological cause. -Continue Tylenol  650 mg TID for pain management. -Consider referral to Rheumatology based on lab results. -Consider use of Voltaren gel (diclofenac) for topical pain relief.   Overactive bladder Assessment &  Plan: Current treatment with VESIcare  (5mg ) not providing sufficient relief. Discussed options of increasing VESIcare  dose or switching to Myrbetriq. -Increase VESIcare  to 10mg . -Referral for pelvic floor therapy.   Closed fracture of superior ramus of right pubis, sequela Assessment & Plan: Difficulty walking and persistent pain following pelvic fractures from a fall two years  ago. -Referral for physical therapy to improve mobility and pain management.   PVD (peripheral vascular disease) (HCC) Assessment & Plan: Chronic.  Bilateral lower extremity edema and Raynaud's syndrome. Lower extremity Doppler did not show abnormal arterial blood flow Previously referred to vascular surgery, 08/2020.  Appointment was canceled as patient felt she did not need. -Consider resending referral in future       PDMP reviewed  Return in about 3 months (around 12/05/2023) for PCP, Mood, Thyroid .  Taylor Ferrari, MD

## 2023-09-07 NOTE — Patient Instructions (Addendum)
 It was a pleasure meeting you today. Thank you for allowing me to take part in your health care.  Our goals for today as we discussed include:  We will get some labs today.  If they are abnormal or we need to do something about them, I will call you.  If they are normal, I will send you a message on MyChart (if it is active) or a letter in the mail.  If you don't hear from us  in 2 weeks, please call the office at the number below.   Take Tylenol  650 mg two tablets in the morning and evening. Can increase to three times a day if no improvement in 2-4 weeks.  Use Voltaren gel 4 times a day as needed Trial Capsaicin gel, Biofreeze for arthritic pain   Refill sent for Zoloft   Increase Vesicare  to 10 mg daily for overactive bladder.  If no improvement in 2-3 weeks can trial Mybetriq.  Referral sent for physical therapy to help with pain/balance/pelvic floor therapy.  Follow up in 3 months or sooner if worsens   This is a list of the screening recommended for you and due dates:  Health Maintenance  Topic Date Due   Zoster (Shingles) Vaccine (1 of 2) Never done   DTaP/Tdap/Td vaccine (4 - Td or Tdap) 12/30/2006   Medicare Annual Wellness Visit  04/25/2022   Flu Shot  03/01/2023   COVID-19 Vaccine (5 - 2024-25 season) 04/01/2023   Pneumonia Vaccine  Completed   DEXA scan (bone density measurement)  Completed   HPV Vaccine  Aged Out     If you have any questions or concerns, please do not hesitate to call the office at [phone].  I look forward to our next visit and until then take care and stay safe.  Regards,   Glenys Ferrari, MD   [facility]

## 2023-09-10 ENCOUNTER — Encounter: Payer: Self-pay | Admitting: Family Medicine

## 2023-09-10 ENCOUNTER — Telehealth: Payer: Self-pay

## 2023-09-10 DIAGNOSIS — M2550 Pain in unspecified joint: Secondary | ICD-10-CM | POA: Insufficient documentation

## 2023-09-10 DIAGNOSIS — N3281 Overactive bladder: Secondary | ICD-10-CM

## 2023-09-10 DIAGNOSIS — M1990 Unspecified osteoarthritis, unspecified site: Secondary | ICD-10-CM | POA: Insufficient documentation

## 2023-09-10 MED ORDER — SOLIFENACIN SUCCINATE 10 MG PO TABS: 10.0000 mg | ORAL_TABLET | Freq: Every day | ORAL | 0 refills | Status: DC

## 2023-09-10 NOTE — Addendum Note (Signed)
 Addended by: Chadwick Colonel on: 09/10/2023 02:57 PM   Modules accepted: Orders

## 2023-09-10 NOTE — Assessment & Plan Note (Deleted)
 Constant pain in hands, affecting daily activities and quality of life. No reported flare-ups, fevers, swelling, or redness. Currently treating with Tylenol  with minimal relief. Discussed possibility of rheumatological cause. -Order blood work to rule out rheumatological cause. -Continue Tylenol  650 mg TID for pain management. -Consider referral to Rheumatology based on lab results. -Consider use of Voltaren gel (diclofenac) for topical pain relief.

## 2023-09-10 NOTE — Assessment & Plan Note (Signed)
 Constant pain in hands, affecting daily activities and quality of life. No reported flare-ups, fevers, swelling, or redness. Currently treating with Tylenol  with minimal relief. Discussed possibility of rheumatological cause. -Order blood work to rule out rheumatological cause. -Continue Tylenol  650 mg TID for pain management. -Consider referral to Rheumatology based on lab results. -Consider use of Voltaren gel (diclofenac) for topical pain relief.

## 2023-09-10 NOTE — Telephone Encounter (Signed)
 Patient has been given results.

## 2023-09-10 NOTE — Telephone Encounter (Signed)
-----   Message from Valli Gaw sent at 09/10/2023  8:08 AM EST ----- Inflammatory markers are normal  Kidney and liver functions normal.   Hemoglobin is good and all other labs did not show significant change.  Some blood still pending and will notify when results received.

## 2023-09-10 NOTE — Telephone Encounter (Signed)
 Copied from CRM [id number]. Topic: Clinical - Medication Question >> Sep 10, 2023  2:27 PM Allyne Areola wrote: Reason for CRM: Patient's prescription for VESIcare  to 10mg  was sent to the incorrect pharmacy. She would like for it to be sent to [facility] - [place], [place] - [address]  Phone: [phone] Fax: [phone]

## 2023-09-10 NOTE — Assessment & Plan Note (Signed)
 Currently on Synthroid . Discussed potential link between thyroid  function and joint pain, anxiety, and other symptoms. -Order TSH, T3, T4,rT3, TPOAB, TrAB, ThyAB at daughters request -Continue Synthroid  75 mcg daily -Consider referral to Endocrinology based on lab results.

## 2023-09-10 NOTE — Telephone Encounter (Signed)
Rx has been sent to correct pharmacy. 

## 2023-09-10 NOTE — Assessment & Plan Note (Signed)
 Chronic.  Bilateral lower extremity edema and Raynaud's syndrome. Lower extremity Doppler did not show abnormal arterial blood flow Previously referred to vascular surgery, 08/2020.  Appointment was canceled as patient felt she did not need. -Consider resending referral in future

## 2023-09-10 NOTE — Assessment & Plan Note (Signed)
 Current treatment with VESIcare  (5mg ) not providing sufficient relief. Discussed options of increasing VESIcare  dose or switching to Myrbetriq. -Increase VESIcare  to 10mg . -Referral for pelvic floor therapy.

## 2023-09-10 NOTE — Telephone Encounter (Signed)
 Left message to return call to our office.  Okay to read results to pt. Please document when pt is given results.

## 2023-09-10 NOTE — Assessment & Plan Note (Signed)
 Currently managed with Zoloft . Discussed potential link between thyroid  function and mood. -Continue Zoloft  100 mg daily

## 2023-09-10 NOTE — Assessment & Plan Note (Signed)
 Difficulty walking and persistent pain following pelvic fractures from a fall two years ago. -Referral for physical therapy to improve mobility and pain management.

## 2023-09-11 MED ORDER — SERTRALINE HCL 100 MG PO TABS: 100.0000 mg | ORAL_TABLET | Freq: Every day | ORAL | 1 refills | Status: DC

## 2023-09-11 NOTE — Addendum Note (Signed)
Addended by: Vertis Kelch on: 09/11/2023 11:20 AM   Modules accepted: Orders

## 2023-09-12 LAB — COMPREHENSIVE METABOLIC PANEL
ALT: 27 [IU]/L (ref 0–32)
AST: 21 [IU]/L (ref 0–40)
Albumin: 4.4 g/dL (ref 3.7–4.7)
Alkaline Phosphatase: 99 [IU]/L (ref 44–121)
BUN/Creatinine Ratio: 20 (ref 12–28)
BUN: 13 mg/dL (ref 8–27)
Bilirubin Total: 0.3 mg/dL (ref 0.0–1.2)
CO2: 22 mmol/L (ref 20–29)
Calcium: 9.7 mg/dL (ref 8.7–10.3)
Chloride: 105 mmol/L (ref 96–106)
Creatinine, Ser: 0.64 mg/dL (ref 0.57–1.00)
Globulin, Total: 2.4 g/dL (ref 1.5–4.5)
Glucose: 85 mg/dL (ref 70–99)
Potassium: 4.2 mmol/L (ref 3.5–5.2)
Sodium: 144 mmol/L (ref 134–144)
Total Protein: 6.8 g/dL (ref 6.0–8.5)
eGFR: 87 mL/min/{1.73_m2} (ref >59)

## 2023-09-12 LAB — PROTEIN ELECTROPHORESIS, SERUM
A/G Ratio: 1.3 (ref 0.7–1.7)
Albumin ELP: 3.9 g/dL (ref 2.9–4.4)
Alpha 1: 0.2 g/dL (ref 0.0–0.4)
Alpha 2: 0.6 g/dL (ref 0.4–1.0)
Beta: 0.8 g/dL (ref 0.7–1.3)
Gamma Globulin: 1.2 g/dL (ref 0.4–1.8)
Globulin, Total: 2.9 g/dL (ref 2.2–3.9)

## 2023-09-12 LAB — CBC WITH DIFFERENTIAL/PLATELET
Basophils Absolute: 0.1 10*3/uL (ref 0.0–0.2)
Basos: 1 %
EOS (ABSOLUTE): 0.2 10*3/uL (ref 0.0–0.4)
Eos: 3 %
Hematocrit: 39 % (ref 34.0–46.6)
Hemoglobin: 13.1 g/dL (ref 11.1–15.9)
Immature Grans (Abs): 0 10*3/uL (ref 0.0–0.1)
Immature Granulocytes: 0 %
Lymphocytes Absolute: 1.9 10*3/uL (ref 0.7–3.1)
Lymphs: 28 %
MCH: 33.8 pg — ABNORMAL HIGH (ref 26.6–33.0)
MCHC: 33.6 g/dL (ref 31.5–35.7)
MCV: 101 fL — ABNORMAL HIGH (ref 79–97)
Monocytes Absolute: 0.4 10*3/uL (ref 0.1–0.9)
Monocytes: 5 %
Neutrophils Absolute: 4.2 10*3/uL (ref 1.4–7.0)
Neutrophils: 63 %
Platelets: 245 10*3/uL (ref 150–450)
RBC: 3.88 x10E6/uL (ref 3.77–5.28)
RDW: 13.1 % (ref 11.7–15.4)
WBC: 6.7 10*3/uL (ref 3.4–10.8)

## 2023-09-12 LAB — C-REACTIVE PROTEIN: CRP: 2 mg/L (ref 0–10)

## 2023-09-12 LAB — SEDIMENTATION RATE: Sed Rate: 8 mm/h (ref 0–40)

## 2023-09-12 LAB — T3, REVERSE: Reverse T3, Serum: 19.1 ng/dL (ref 9.2–24.1)

## 2023-09-12 LAB — ANA

## 2023-09-19 ENCOUNTER — Other Ambulatory Visit: Payer: Self-pay | Admitting: Family Medicine

## 2023-09-19 ENCOUNTER — Telehealth: Payer: Self-pay | Admitting: Family Medicine

## 2023-09-19 DIAGNOSIS — M2550 Pain in unspecified joint: Secondary | ICD-10-CM

## 2023-09-19 NOTE — Addendum Note (Signed)
Addended by: Warden Fillers on: 09/19/2023 03:10 PM   Modules accepted: Orders

## 2023-09-19 NOTE — Telephone Encounter (Signed)
LMTCB. Okay to give message below.

## 2023-09-20 NOTE — Telephone Encounter (Signed)
Called and spoke to pt and she is going to talk to daughter and see what day she can come in for labs. Will call back to schedule a lab only appointment.

## 2023-09-21 NOTE — Telephone Encounter (Signed)
Copied from CRM [id number]. Topic: Appointments - Appointment Scheduling >> Sep 20, 2023  5:15 PM Taylor Mccarthy wrote: Patient would like to schedule for labs between the times of 1:30 - 2:00 due to her transportation situation. I let patient know that the only time slot available is for 2:45. Patient refused and stated that we need to accommodate her availability and would like to speak to the medical assistant at the clinic Tresa Endo Fiscal) at the earliest convenience.

## 2023-09-21 NOTE — Telephone Encounter (Signed)
Called and spoke to pt. She will be coming in at 2 on 09/26/2023

## 2023-09-26 ENCOUNTER — Other Ambulatory Visit (INDEPENDENT_AMBULATORY_CARE_PROVIDER_SITE_OTHER): Payer: Medicare HMO

## 2023-09-26 ENCOUNTER — Other Ambulatory Visit: Payer: Medicare HMO

## 2023-09-26 DIAGNOSIS — E039 Hypothyroidism, unspecified: Secondary | ICD-10-CM | POA: Diagnosis not present

## 2023-09-26 DIAGNOSIS — M2550 Pain in unspecified joint: Secondary | ICD-10-CM | POA: Diagnosis not present

## 2023-09-26 NOTE — Addendum Note (Signed)
 Addended by: Warden Fillers on: 09/26/2023 02:08 PM   Modules accepted: Orders

## 2023-09-26 NOTE — Addendum Note (Signed)
 Addended by: Warden Fillers on: 09/26/2023 02:23 PM   Modules accepted: Orders

## 2023-09-28 ENCOUNTER — Telehealth: Payer: Self-pay

## 2023-09-28 NOTE — Telephone Encounter (Signed)
 Called and spoke to the daughter and she was wanting to know which lab was drawn the other day. I did let her know that we are still waiting for results from the thyroid.

## 2023-09-28 NOTE — Telephone Encounter (Signed)
 Left message to return call to our office.  Okay to relay results to pt. Please document when results are given.

## 2023-09-28 NOTE — Telephone Encounter (Signed)
-----   Message from Dana Allan sent at 09/28/2023 10:44 AM EST ----- Negative for rheumatoid or lupus Thyroid  labs pending

## 2023-09-28 NOTE — Telephone Encounter (Signed)
 Copied from CRM [id number]. Topic: General - Other >> Sep 28, 2023 11:17 AM Eunice Blase wrote: Reason for CRM: Pt called for lab results which were provided but pt had further questions regarding labs. Please call pt at [phone].

## 2023-10-08 ENCOUNTER — Ambulatory Visit

## 2023-10-12 LAB — TSH+T3+THYABS+TPO AB+TRAB+T...
Anti-Thyroglobulin Antibodies: 4.9 [IU]/mL — ABNORMAL HIGH
Anti-Thyroid Peroxidase Ab: <9 [IU]/mL
Free T-3: 2.5 pg/mL
Free T4 by Dialysis: 1 ng/dL
Reverse T3,  LCMS Endo Sci: 16.4 ng/dL
TSH Receptor Antibody (TBII): 0.7 U/L
TSH: 3.9 uU/mL
Triiodothyronine (T-3), Serum: 75 ng/dL

## 2023-10-12 LAB — ANTI-DNA ANTIBODY, DOUBLE-STRANDED: dsDNA Ab: 3 [IU]/mL (ref 0–9)

## 2023-10-12 LAB — RHEUMATOID FACTOR: Rheumatoid fact SerPl-aCnc: <10 [IU]/mL (ref <14.0)

## 2023-10-15 ENCOUNTER — Ambulatory Visit: Attending: Family Medicine

## 2023-10-15 DIAGNOSIS — M2550 Pain in unspecified joint: Secondary | ICD-10-CM | POA: Diagnosis not present

## 2023-10-15 DIAGNOSIS — R29898 Other symptoms and signs involving the musculoskeletal system: Secondary | ICD-10-CM | POA: Insufficient documentation

## 2023-10-15 DIAGNOSIS — N3281 Overactive bladder: Secondary | ICD-10-CM | POA: Diagnosis not present

## 2023-10-15 DIAGNOSIS — M6281 Muscle weakness (generalized): Secondary | ICD-10-CM | POA: Diagnosis not present

## 2023-10-15 NOTE — Progress Notes (Signed)
 OUTPATIENT PHYSICAL THERAPY LOWER EXTREMITY EVALUATION   Patient Name: Taylor Mccarthy MRN: [id number] DOB:30-Mar-1941, 83 y.o., female Today's Date: 10/15/2023  END OF SESSION:  PT End of Session - 10/15/23 1718     Visit Number 1    Number of Visits 17    Date for PT Re-Evaluation 12/10/23    Authorization Type 2x/week x 8 weeks (recert needed 7/82/95, PN at 10th visit)    PT Start Time 1420    PT Stop Time 1510    PT Time Calculation (min) 50 min    Activity Tolerance Patient tolerated treatment well    Behavior During Therapy WFL for tasks assessed/performed             Past Medical History:  Diagnosis Date   Atrial fibrillation, persistent (HCC)    Cancer (HCC)    colon cancer 1999 s/p resection    History of colon polyps    Insomnia    Low back pain    Mitral regurgitation    Pap smear abnormality of cervix with ASCUS favoring benign    Permanent atrial fibrillation (HCC) 08/06/2022   Sciatica    Vitamin D deficiency    Past Surgical History:  Procedure Laterality Date   COLON RESECTION     1999 for cancer    TONSILLECTOMY     WISDOM TOOTH EXTRACTION     Patient Active Problem List   Diagnosis Date Noted   Arthritis 09/10/2023   Polyarthralgia 09/10/2023   OAB (overactive bladder) 09/10/2023   Muscle spasm 03/11/2023   PVD (peripheral vascular disease) (HCC) 10/20/2022   History of pelvic fracture 10/20/2022   Anemia 10/20/2022   Acute dermatitis 10/16/2022   Atrophic vaginitis 10/16/2022   Candidiasis of vulva 10/16/2022   Fatigue 10/16/2022   Hip pain 10/16/2022   Leukorrhea 10/16/2022   Low back pain 10/16/2022   Motion sickness 10/16/2022   Neck sprain 10/16/2022   Osteoarthritis of hip 10/16/2022   Pain in limb 10/16/2022   Sciatica 10/16/2022   Lipid screening 08/06/2022   Chronic heart failure with preserved ejection fraction (HCC) 08/06/2022   Sacral fracture, closed (HCC)    Rhabdomyolysis 11/13/2021   Pelvic fracture (HCC) 11/12/2021    Overactive bladder 06/27/2021   Osteopenia 05/25/2021   Abnormal thyroid blood test 09/11/2019   Macrocytosis 09/11/2019   Mood disorder (HCC) 05/07/2019   Abnormal gait 05/07/2019   Urinary incontinence 05/07/2019   Longstanding persistent atrial fibrillation (HCC) 05/28/2018   History of colon cancer 05/28/2018   Vertigo 05/28/2018   DDD (degenerative disc disease), lumbar 05/28/2018   Hypothyroidism 05/28/2018   Cough 05/28/2018   Vitamin D deficiency 05/28/2018   Insomnia 05/28/2018   Lesion of oral mucosa 05/28/2018   Cardiac arrhythmia 05/21/2018   Allergies 05/21/2018   Non-seasonal allergic rhinitis 05/08/2018   Unsteadiness 05/08/2018    PCP: Dr. Clent Ridges, MD  REFERRING PROVIDER: same  REFERRING DIAG: M25.50 (ICD-10-CM) - Polyarthralgia   THERAPY DIAG:  Polyarthralgia  Hip weakness  Muscle weakness (generalized)  Rationale for Evaluation and Treatment: Rehabilitation  ONSET DATE: 10/20/22 pelvic fx  SUBJECTIVE:   SUBJECTIVE STATEMENT: 3 months ago, dull/achy pain in arms/wrist and hands- thinks it is arthritis; also is having some pain/achy in her pelvis too  Feels like her walking is impaired!!!!!!    Fear of falling, so not exercising as much now and is losing her strength.  Has vertigo so she has some balance issues.    She feels like her legs are  weak, bc her lower back is weak too.  She was very active prior to the fall.  She has been using the [facility] since the initial injury.  She mainly uses it out of the home.  She has a rollator at home she uses.  Has does report feeling unsteady/dizzy/vertigo   PERTINENT HISTORY: Larey Seat and fx pelvis, had surgery, was in rehab after that too; recently moved into her child's home from [place]; has lost a lot of loved ones this year PAIN:  Are you having pain?   PRECAUTIONS: Fall  RED FLAGS: {PT Red Flags:29287}   WEIGHT BEARING RESTRICTIONS: No  FALLS:  Has patient fallen in last 6 months? No; has  had a few "wobbles"  LIVING ENVIRONMENT: Lives with: alone, she moved into her daughter's home Lives in: House/apartment Stairs: No no stairs; 1 step up but that's it Has following equipment at home: Single point cane; able to drive and takes care of her housework  OCCUPATION: retired; Conservation officer, historic buildings Programme researcher, broadcasting/film/video), worked in Engineer, production, Social research officer, government, painting   PLOF: Independent  PATIENT GOALS: strengthen her lower back and legs   NEXT MD VISIT: none scheduled   OBJECTIVE:  Note: Objective measures were completed at Evaluation unless otherwise noted.  DIAGNOSTIC FINDINGS: ***  PATIENT SURVEYS:  LEFS ***  COGNITION: Overall cognitive status: {cognition:24006}     SENSATION: {sensation:27233}  EDEMA:  {edema:24020}   POSTURE: {posture:25561}  PALPATION: ***  LOWER EXTREMITY ROM:  {AROM/PROM:27142} ROM Right eval Left eval  Hip flexion    Hip extension    Hip abduction    Hip adduction    Hip internal rotation    Hip external rotation    Knee flexion    Knee extension    Ankle dorsiflexion    Ankle plantarflexion    Ankle inversion    Ankle eversion     (Blank rows = not tested)  LOWER EXTREMITY MMT:  MMT Right eval Left eval  Hip flexion    Hip extension    Hip abduction    Hip adduction    Hip internal rotation    Hip external rotation    Knee flexion    Knee extension    Ankle dorsiflexion    Ankle plantarflexion    Ankle inversion    Ankle eversion     (Blank rows = not tested)  LOWER EXTREMITY SPECIAL TESTS:  {LEspecialtests:26242}  FUNCTIONAL TESTS:  {Functional tests:24029}  GAIT: Distance walked: *** Assistive device utilized: {Assistive devices:23999} Level of assistance: {Levels of assistance:24026} Comments: ***                                                                                                           TREATMENT DATE: 10/15/23  Initial Evaluation Note  PATIENT EDUCATION:  Education  details: *** Person educated: {Person educated:25204} Education method: {Education Method:25205} Education comprehension: {Education Comprehension:25206}  HOME EXERCISE PROGRAM: ***  ASSESSMENT:  CLINICAL IMPRESSION: Patient is a 83 y.o. F who was seen today for physical therapy evaluation and treatment for a course of PT  after sustaining a pelvic fx.   OBJECTIVE IMPAIRMENTS: {opptimpairments:25111}.   ACTIVITY LIMITATIONS: {activitylimitations:27494}  PARTICIPATION LIMITATIONS: {participationrestrictions:25113}  PERSONAL FACTORS: {Personal factors:25162} are also affecting patient's functional outcome.   REHAB POTENTIAL: {rehabpotential:25112}  CLINICAL DECISION MAKING: {clinical decision making:25114}  EVALUATION COMPLEXITY: {Evaluation complexity:25115}   GOALS: Goals reviewed with patient? {yes/no:20286}  SHORT TERM GOALS: Target date: 10/30/23 *** Baseline: Goal status: INITIAL    LONG TERM GOALS: Target date: 12/10/23  *** Baseline:  Goal status: INITIAL  2.  *** Baseline:  Goal status: INITIAL  3.  *** Baseline:  Goal status: INITIAL   PLAN:  PT FREQUENCY: 2x/week  PT DURATION: 8 weeks  PLANNED INTERVENTIONS: {rehab planned interventions:25118::"97110-Therapeutic exercises","97530- Therapeutic [phone]- Neuromuscular re-education","97535- Self JXBJ","47829- Manual therapy"}  PLAN FOR NEXT SESSION: strengthening, balance, gait training (BERG next visit)  Max Fickle, PT, DPT, OCS   Ardine Bjork, PT 10/15/2023, 5:20 PM

## 2023-10-17 ENCOUNTER — Ambulatory Visit

## 2023-10-17 DIAGNOSIS — M2550 Pain in unspecified joint: Secondary | ICD-10-CM | POA: Diagnosis not present

## 2023-10-17 DIAGNOSIS — M6281 Muscle weakness (generalized): Secondary | ICD-10-CM

## 2023-10-17 DIAGNOSIS — R29898 Other symptoms and signs involving the musculoskeletal system: Secondary | ICD-10-CM | POA: Diagnosis not present

## 2023-10-17 DIAGNOSIS — N3281 Overactive bladder: Secondary | ICD-10-CM | POA: Diagnosis not present

## 2023-10-17 NOTE — Therapy (Signed)
 OUTPATIENT PHYSICAL THERAPY LOWER EXTREMITY TREATMENT     Patient Name: Taylor Mccarthy MRN: [id number] DOB:[birth year], 83 y.o., female Today's Date: 10/15/2023   END OF SESSION:   PT End of Session - 10/15/23 1718       Visit Number 2    Number of Visits 17     Date for PT Re-Evaluation 12/10/23     Authorization Type 2x/week x 8 weeks (recert needed 11/07/79, PN at 10th visit)     PT Start Time 1500    PT Stop Time 1545    PT Time Calculation (min) 45 min     Activity Tolerance Patient tolerated treatment well     Behavior During Therapy WFL for tasks assessed/performed                       Past Medical History:  Diagnosis Date   Atrial fibrillation, persistent (HCC)     Cancer (HCC)      colon cancer 1999 s/p resection    History of colon polyps     Insomnia     Low back pain     Mitral regurgitation     Pap smear abnormality of cervix with ASCUS favoring benign     Permanent atrial fibrillation (HCC) 08/06/2022   Sciatica     Vitamin D deficiency               Past Surgical History:  Procedure Laterality Date   COLON RESECTION        1999 for cancer    TONSILLECTOMY       WISDOM TOOTH EXTRACTION                Patient Active Problem List    Diagnosis Date Noted   Arthritis 09/10/2023   Polyarthralgia 09/10/2023   OAB (overactive bladder) 09/10/2023   Muscle spasm 03/11/2023   PVD (peripheral vascular disease) (HCC) 10/20/2022   History of pelvic fracture 10/20/2022   Anemia 10/20/2022   Acute dermatitis 10/16/2022   Atrophic vaginitis 10/16/2022   Candidiasis of vulva 10/16/2022   Fatigue 10/16/2022   Hip pain 10/16/2022   Leukorrhea 10/16/2022   Low back pain 10/16/2022   Motion sickness 10/16/2022   Neck sprain 10/16/2022   Osteoarthritis of hip 10/16/2022   Pain in limb 10/16/2022   Sciatica 10/16/2022   Lipid screening 08/06/2022   Chronic heart failure with preserved ejection fraction (HCC) 08/06/2022   Sacral fracture, closed (HCC)      Rhabdomyolysis 11/13/2021   Pelvic fracture (HCC) 11/12/2021   Overactive bladder 06/27/2021   Osteopenia 05/25/2021   Abnormal thyroid blood test 09/11/2019   Macrocytosis 09/11/2019   Mood disorder (HCC) 05/07/2019   Abnormal gait 05/07/2019   Urinary incontinence 05/07/2019   Longstanding persistent atrial fibrillation (HCC) 05/28/2018   History of colon cancer 05/28/2018   Vertigo 05/28/2018   DDD (degenerative disc disease), lumbar 05/28/2018   Hypothyroidism 05/28/2018   Cough 05/28/2018   Vitamin D deficiency 05/28/2018   Insomnia 05/28/2018   Lesion of oral mucosa 05/28/2018   Cardiac arrhythmia 05/21/2018   Allergies 05/21/2018   Non-seasonal allergic rhinitis 05/08/2018   Unsteadiness 05/08/2018      PCP: Dr. Clent Ridges, MD   REFERRING PROVIDER: same   REFERRING DIAG: M25.50 (ICD-10-CM) - Polyarthralgia    THERAPY DIAG:  Polyarthralgia   Hip weakness   Muscle weakness (generalized)   Rationale for Evaluation and Treatment: Rehabilitation   ONSET DATE: 2023- pelvic fx  SUBJECTIVE:    SUBJECTIVE STATEMENT: Pt has a few concerns today: she reports pain/achy sensation in pelvis/hips and like her strength is limited after her pelvic fx last year; also having some dull/achy pain in wrist/hands and arms which she attributes to arthritis.  Planning to focus on lower body concerns today.   She feels like her legs are weak and her lower back is weak too.  She was very active prior to the fall.   She noticed sx ~3 months ago, but overall has had a challenging year including moving into a new home and navigating the unexpected loss of a close family member (daughter).   Feels like her walking is impaired.  Using a SPC for amb.   Fear of falling even though she has not fallen since the injury that resulted in pelvic fx last year.   She has been using the [facility] since the initial injury.  She mainly uses it out of the home.  She has a rollator at home she uses.   She  does report feeling unsteady/dizzy/vertigo feelings which began after her fall.  Has not been evaluated for this yet.     PERTINENT HISTORY: Larey Seat and fx pelvis- no surgery; did a course of inpatient rehab and PT after that PAIN:  Are you having pain? Yes 4/10   PRECAUTIONS: Fall   RED FLAGS: None      WEIGHT BEARING RESTRICTIONS: No   FALLS:  Has patient fallen in last 6 months? No; has had a few "wobbles"   LIVING ENVIRONMENT: Lives with: alone, she moved into her daughter's home Lives in: House/apartment Stairs: No no stairs; 1 step up but that's it Has following equipment at home: Single point cane; able to drive and takes care of her housework   OCCUPATION: retired; Conservation officer, historic buildings Programme researcher, broadcasting/film/video), worked in Engineer, production, Social research officer, government, painting    PLOF: Independent   PATIENT GOALS: strengthen her lower back and legs    NEXT MD VISIT: none scheduled    OBJECTIVE:  Note: Objective measures were completed at Evaluation unless otherwise noted.   DIAGNOSTIC FINDINGS: none recently, per chart review- April 2023 x-ray report pelvis/sacrum/coccyx   IMPRESSION: Acute displaced fractures of the right and left superior and inferior pubic rami.   IMPRESSION: Displaced fracture is seen in the right iliac bone in the medial margin of right acetabulum. Comminuted fractures are seen in the left superior pubic ramus and right inferior pubic ramus. These fractures are better evaluated in the radiographs of hip done today.   PATIENT SURVEYS:  LEFS 42/80   COGNITION: Overall cognitive status: Within functional limits for tasks assessed                         SENSATION: WFL   POSTURE: pt amb with SPC in R UE today, decreased gait speed and slight trunk flexion/hip flexion   PALPATION: Specific palpation deferred   LOWER EXTREMITY ROM:   Active ROM Right eval Left eval  Hip flexion Min limited compared to L normal  Hip extension      Hip abduction       Hip adduction      Hip internal rotation Min limited compared to L normal  Hip external rotation      Knee flexion normal normal  Knee extension normal normal  Ankle dorsiflexion      Ankle plantarflexion      Ankle inversion      Ankle eversion       (  Blank rows = not tested)   LOWER EXTREMITY MMT:   MMT Right eval Left eval  Hip flexion 3+ 5  Hip extension 3+ 4+  Hip abduction 3+ 4+  Hip adduction      Hip internal rotation 3+ 4  Hip external rotation 4 4  Knee flexion 5 5  Knee extension 4 5  Ankle dorsiflexion      Ankle plantarflexion      Ankle inversion      Ankle eversion       (Blank rows = not tested)   LOWER EXTREMITY SPECIAL TESTS:  (-) seated slump test   FUNCTIONAL TESTS:  5 times sit to stand: pt compensates with leaning femur on edge of plinth instead of using LE muscles; cues for "stand tall" used;  3 attempts of 5X STS performed today; best time was 15 seconds, but pt unable to perform without use of UE or LE substitutions   GAIT: Distance walked: into clinic with SPC in R UE and SPC appears to be slightly taller than ideal height, will adjust as needed at next visit                                                                                                            TREATMENT DATE: 10/17/23  Subjective:   0/10 pain  Objective:  Therapeutic exercise: Sit to stand: practiced from standard chair: x5; 4 sets PT verbal/visual/tactile cues for using hip hinge; scooting hips forward and bending knees slightly for optimal body mechanics Mini squat: 2x10 Seated marches: 2x10 Seated hip abd: green band 2x10 Bridges: 2x10 (PT stabilized feet)  Neuro re-ed: Pt amb with SPC in R UE, height is tall for her body mechanics, pt amb with wide base of support and decreased hip extension Adjusted cane height 2 notches lower and switched hands Practiced ambulating with SPC in L hand to assist with R LE weakness on flat surfaces  HEP instruction      PATIENT EDUCATION:  Education details: PT POC/goals, purpose and benefit of LE strengthening to improve balance and gait; discussed using SPC out of home and rollator in home; discussed exercise goals   Person educated: Patient Education method: Explanation and Verbal cues Education comprehension: verbalized understanding and needs further education   HOME EXERCISE PROGRAM: Practice using SPC on L UE  Access Code: [email or web address] URL: [email or web address] Date: 10/17/2023 Prepared by: Max Fickle  Exercises - Supine Bridge  - 1 x daily - 7 x weekly - 2 sets - 10 reps - 3 hold - Seated Hip Abduction with Resistance  - 1 x daily - 7 x weekly - 2 sets - 10 reps - Sit to Stand  - 1 x daily - 7 x weekly - 3 sets - 5 reps - Seated March  - 1 x daily - 7 x weekly - 2 sets - 10 reps   ASSESSMENT:   CLINICAL IMPRESSION: Patient would benefit from more practice with SPC in L hand to promote optimal gait mechanics.  She lacks LE and core  mm strength, this is evident during therapeutic exercises today- required PT LE stability on feet for bridges.  Has difficulty performing sit to stand without UE support.  Overall she should continue to benefit from skilled PT treatment to address LE weakness/difficulty walking/decreased activity tolerance/decreased balance/fear of falling/gait abnormalities after sustaining a pelvic fx in 2023.   OBJECTIVE IMPAIRMENTS: Abnormal gait, decreased balance, decreased mobility, difficulty walking, decreased strength, postural dysfunction, and pain.    ACTIVITY LIMITATIONS: carrying, lifting, bending, squatting, stairs, and locomotion level   PARTICIPATION LIMITATIONS: meal prep, cleaning, shopping, community activity, and yard work   PERSONAL FACTORS: Age and Time since onset of injury/illness/exacerbation are also affecting patient's functional outcome.    REHAB POTENTIAL: Good   CLINICAL DECISION MAKING: Stable/uncomplicated   EVALUATION  COMPLEXITY: Low     GOALS: Goals reviewed with patient? Yes   SHORT TERM GOALS: Target date: 10/30/23 Pt will be able to perform STS without use of LE or UE compensatory patterns from elevated chair height 24 inches x5 to facilitate improved LE strength, balance, and walking endurance Baseline: unable Goal status: INITIAL       LONG TERM GOALS: Target date: 12/10/23   Improve LEFS >18 indicating pt able to perform her daily activities at home without being limited by LE weakness/pain Baseline: 42/80 (MCID is 9) Goal status: INITIAL   2.  Improve LE strength 1/2 MMT grade to promote pt being able to perform 30 min heavy housework, squat, get in and out of her car without being limited by LE weakness Baseline:  Goal status: INITIAL   3.  Pt will amb x 10 minutes and no loss of balance with least restrictive AD to amb into out of stores and around her home for housework without being limited by LE pain and weakness  Baseline:  Goal status: INITIAL     PLAN:   PT FREQUENCY: 2x/week   PT DURATION: 8 weeks   PLANNED INTERVENTIONS: 97110-Therapeutic exercises, 97530- Therapeutic activity, [id number]- Neuromuscular re-education, 97535- Self Care, and 16109- Manual therapy   PLAN FOR NEXT SESSION: strengthening, balance, gait training (BERG next visit)   Max Fickle, PT, DPT, OCS   Ardine Bjork, PT 10/15/2023, 5:20 PM

## 2023-10-22 ENCOUNTER — Encounter

## 2023-10-24 ENCOUNTER — Ambulatory Visit

## 2023-10-24 DIAGNOSIS — M2550 Pain in unspecified joint: Secondary | ICD-10-CM

## 2023-10-24 DIAGNOSIS — R29898 Other symptoms and signs involving the musculoskeletal system: Secondary | ICD-10-CM | POA: Diagnosis not present

## 2023-10-24 DIAGNOSIS — N3281 Overactive bladder: Secondary | ICD-10-CM | POA: Diagnosis not present

## 2023-10-24 DIAGNOSIS — M6281 Muscle weakness (generalized): Secondary | ICD-10-CM

## 2023-10-24 NOTE — Therapy (Addendum)
 OUTPATIENT PHYSICAL THERAPY LOWER EXTREMITY TREATMENT     Patient Name: Taylor Mccarthy MRN: [id number] DOB:[birth year], 83 y.o., female Today's Date: 10/15/2023   END OF SESSION:   PT End of Session - 10/15/23 1718       Visit Number 3    Number of Visits 17     Date for PT Re-Evaluation 12/10/23     Authorization Type 2x/week x 8 weeks (recert needed 0/10/93, PN at 10th visit)     PT Start Time 1415    PT Stop Time 1500    PT Time Calculation (min) 45 min     Activity Tolerance Patient tolerated treatment well     Behavior During Therapy [facility] for tasks assessed/performed                       Past Medical History:  Diagnosis Date   Atrial fibrillation, persistent (HCC)     Cancer (HCC)      colon cancer 1999 s/p resection    History of colon polyps     Insomnia     Low back pain     Mitral regurgitation     Pap smear abnormality of cervix with ASCUS favoring benign     Permanent atrial fibrillation (HCC) 08/06/2022   Sciatica     Vitamin D deficiency               Past Surgical History:  Procedure Laterality Date   COLON RESECTION        1999 for cancer    TONSILLECTOMY       WISDOM TOOTH EXTRACTION                Patient Active Problem List    Diagnosis Date Noted   Arthritis 09/10/2023   Polyarthralgia 09/10/2023   OAB (overactive bladder) 09/10/2023   Muscle spasm 03/11/2023   PVD (peripheral vascular disease) (HCC) 10/20/2022   History of pelvic fracture 10/20/2022   Anemia 10/20/2022   Acute dermatitis 10/16/2022   Atrophic vaginitis 10/16/2022   Candidiasis of vulva 10/16/2022   Fatigue 10/16/2022   Hip pain 10/16/2022   Leukorrhea 10/16/2022   Low back pain 10/16/2022   Motion sickness 10/16/2022   Neck sprain 10/16/2022   Osteoarthritis of hip 10/16/2022   Pain in limb 10/16/2022   Sciatica 10/16/2022   Lipid screening 08/06/2022   Chronic heart failure with preserved ejection fraction (HCC) 08/06/2022   Sacral fracture, closed (HCC)      Rhabdomyolysis 11/13/2021   Pelvic fracture (HCC) 11/12/2021   Overactive bladder 06/27/2021   Osteopenia 05/25/2021   Abnormal thyroid blood test 09/11/2019   Macrocytosis 09/11/2019   Mood disorder (HCC) 05/07/2019   Abnormal gait 05/07/2019   Urinary incontinence 05/07/2019   Longstanding persistent atrial fibrillation (HCC) 05/28/2018   History of colon cancer 05/28/2018   Vertigo 05/28/2018   DDD (degenerative disc disease), lumbar 05/28/2018   Hypothyroidism 05/28/2018   Cough 05/28/2018   Vitamin D deficiency 05/28/2018   Insomnia 05/28/2018   Lesion of oral mucosa 05/28/2018   Cardiac arrhythmia 05/21/2018   Allergies 05/21/2018   Non-seasonal allergic rhinitis 05/08/2018   Unsteadiness 05/08/2018      PCP: Dr. Clent Ridges, MD   REFERRING PROVIDER: same   REFERRING DIAG: M25.50 (ICD-10-CM) - Polyarthralgia    THERAPY DIAG:  Polyarthralgia   Hip weakness   Muscle weakness (generalized)   Rationale for Evaluation and Treatment: Rehabilitation   ONSET DATE: 2023- pelvic fx  SUBJECTIVE:    SUBJECTIVE STATEMENT: Pt has a few concerns today: she reports pain/achy sensation in pelvis/hips and like her strength is limited after her pelvic fx last year; also having some dull/achy pain in wrist/hands and arms which she attributes to arthritis.  Planning to focus on lower body concerns today.   She feels like her legs are weak and her lower back is weak too.  She was very active prior to the fall.   She noticed sx ~3 months ago, but overall has had a challenging year including moving into a new home and navigating the unexpected loss of a close family member (daughter).   Feels like her walking is impaired.  Using a SPC for amb.   Fear of falling even though she has not fallen since the injury that resulted in pelvic fx last year.   She has been using the [facility] since the initial injury.  She mainly uses it out of the home.  She has a rollator at home she uses.   She  does report feeling unsteady/dizzy/vertigo feelings which began after her fall.  Has not been evaluated for this yet.     PERTINENT HISTORY: Larey Seat and fx pelvis- no surgery; did a course of inpatient rehab and PT after that PAIN:  Are you having pain? Yes 4/10   PRECAUTIONS: Fall   RED FLAGS: None      WEIGHT BEARING RESTRICTIONS: No   FALLS:  Has patient fallen in last 6 months? No; has had a few "wobbles"   LIVING ENVIRONMENT: Lives with: alone, she moved into her daughter's home Lives in: House/apartment Stairs: No no stairs; 1 step up but that's it Has following equipment at home: Single point cane; able to drive and takes care of her housework   OCCUPATION: retired; Conservation officer, historic buildings Programme researcher, broadcasting/film/video), worked in Engineer, production, Social research officer, government, painting    PLOF: Independent   PATIENT GOALS: strengthen her lower back and legs    NEXT MD VISIT: none scheduled    OBJECTIVE:  Note: Objective measures were completed at Evaluation unless otherwise noted.   DIAGNOSTIC FINDINGS: none recently, per chart review- April 2023 x-ray report pelvis/sacrum/coccyx   IMPRESSION: Acute displaced fractures of the right and left superior and inferior pubic rami.   IMPRESSION: Displaced fracture is seen in the right iliac bone in the medial margin of right acetabulum. Comminuted fractures are seen in the left superior pubic ramus and right inferior pubic ramus. These fractures are better evaluated in the radiographs of hip done today.   PATIENT SURVEYS:  LEFS 42/80   COGNITION: Overall cognitive status: Within functional limits for tasks assessed                         SENSATION: WFL   POSTURE: pt amb with SPC in R UE today, decreased gait speed and slight trunk flexion/hip flexion   PALPATION: Specific palpation deferred   LOWER EXTREMITY ROM:   Active ROM Right eval Left eval  Hip flexion Min limited compared to L normal  Hip extension      Hip abduction       Hip adduction      Hip internal rotation Min limited compared to L normal  Hip external rotation      Knee flexion normal normal  Knee extension normal normal  Ankle dorsiflexion      Ankle plantarflexion      Ankle inversion      Ankle eversion       (  Blank rows = not tested)   LOWER EXTREMITY MMT:   MMT Right eval Left eval  Hip flexion 3+ 5  Hip extension 3+ 4+  Hip abduction 3+ 4+  Hip adduction      Hip internal rotation 3+ 4  Hip external rotation 4 4  Knee flexion 5 5  Knee extension 4 5  Ankle dorsiflexion      Ankle plantarflexion      Ankle inversion      Ankle eversion       (Blank rows = not tested)   LOWER EXTREMITY SPECIAL TESTS:  (-) seated slump test   FUNCTIONAL TESTS:  5 times sit to stand: pt compensates with leaning femur on edge of plinth instead of using LE muscles; cues for "stand tall" used;  3 attempts of 5X STS performed today; best time was 15 seconds, but pt unable to perform without use of UE or LE substitutions   GAIT: Distance walked: into clinic with SPC in R UE and SPC appears to be slightly taller than ideal height, will adjust as needed at next visit                                                                                                            TREATMENT DATE: 10/24/23  Subjective:   0/10 pain  Objective:  Therapeutic exercise: Sit to stand: practiced from standard chair: x5; 4 sets PT verbal/visual/tactile cues for using hip hinge; scooting hips forward and bending knees slightly for optimal body mechanics Mini squat: 2x10 Seated marches: 2x10 2# Seated hip abd: green band 2x10 Bridges: 2x10 (PT stabilized feet) Standing hip abd: 2# 2x10 ea Standing hip extension: 2# 2x10 ea Standing hamstring curl: 2# 2x10 ea PROM hip ER, flexion, hamstring stretch (pt reports "cramping" during exercises) throughout session: 4 min  Neuro re-ed: Practiced ambulating with SPC in L hand to assist with R LE weakness on flat  surfaces Abdominal bracing with diaphragmatic breathing: exhale + brace x 15, will benefit from more practice, had difficulty with coordination today SLS- not today  HEP instruction    PATIENT EDUCATION:  Education details: PT POC/goals, purpose and benefit of LE strengthening to improve balance and gait; discussed using SPC out of home and rollator in home; discussed exercise goals, HEP, instructions for exercises within PT session   Person educated: Patient Education method: Explanation and Verbal cues Education comprehension: verbalized understanding and needs further education   HOME EXERCISE PROGRAM: Practice using SPC on L UE  Access Code: [email or web address] URL: [email or web address] Date: 10/17/2023 Prepared by: Max Fickle  Exercises - Supine Bridge  - 1 x daily - 7 x weekly - 2 sets - 10 reps - 3 hold - Seated Hip Abduction with Resistance  - 1 x daily - 7 x weekly - 2 sets - 10 reps - Sit to Stand  - 1 x daily - 7 x weekly - 3 sets - 5 reps - Seated March  - 1 x daily - 7 x weekly - 2 sets - 10 reps  ASSESSMENT:   CLINICAL IMPRESSION: Patient was challenged with LE strengthening; demonstrates weakness in standing, sitting, and supine exercises today.  Demonstrates improved body mechanics for sit to stand.  Resorts to habit of using SPC in R hand instead of L.  No increase in pain noted during or after tx.  She is very motivated to participate in PT.  Overall she should continue to benefit from skilled PT treatment to address LE weakness/difficulty walking/decreased activity tolerance/decreased balance/fear of falling/gait abnormalities after sustaining a pelvic fx in 2023.   OBJECTIVE IMPAIRMENTS: Abnormal gait, decreased balance, decreased mobility, difficulty walking, decreased strength, postural dysfunction, and pain.    ACTIVITY LIMITATIONS: carrying, lifting, bending, squatting, stairs, and locomotion level   PARTICIPATION LIMITATIONS: meal prep, cleaning,  shopping, community activity, and yard work   PERSONAL FACTORS: Age and Time since onset of injury/illness/exacerbation are also affecting patient's functional outcome.    REHAB POTENTIAL: Good   CLINICAL DECISION MAKING: Stable/uncomplicated   EVALUATION COMPLEXITY: Low     GOALS: Goals reviewed with patient? Yes   SHORT TERM GOALS: Target date: 10/30/23 Pt will be able to perform STS without use of LE or UE compensatory patterns from elevated chair height 24 inches x5 to facilitate improved LE strength, balance, and walking endurance Baseline: unable Goal status: INITIAL       LONG TERM GOALS: Target date: 12/10/23   Improve LEFS >18 indicating pt able to perform her daily activities at home without being limited by LE weakness/pain Baseline: 42/80 (MCID is 9) Goal status: INITIAL   2.  Improve LE strength 1/2 MMT grade to promote pt being able to perform 30 min heavy housework, squat, get in and out of her car without being limited by LE weakness Baseline:  Goal status: INITIAL   3.  Pt will amb x 10 minutes and no loss of balance with least restrictive AD to amb into out of stores and around her home for housework without being limited by LE pain and weakness  Baseline:  Goal status: INITIAL     PLAN:   PT FREQUENCY: 2x/week   PT DURATION: 8 weeks   PLANNED INTERVENTIONS: 97110-Therapeutic exercises, 97530- Therapeutic activity, [id number]- Neuromuscular re-education, 97535- Self Care, and 16109- Manual therapy   PLAN FOR NEXT SESSION: strengthening, balance, gait training (BERG next visit)   Max Fickle, PT, DPT, OCS   Ardine Bjork, PT 10/15/2023, 5:20 PM

## 2023-10-29 ENCOUNTER — Ambulatory Visit

## 2023-10-31 ENCOUNTER — Ambulatory Visit: Attending: Family Medicine

## 2023-10-31 DIAGNOSIS — M6281 Muscle weakness (generalized): Secondary | ICD-10-CM | POA: Insufficient documentation

## 2023-10-31 DIAGNOSIS — R29898 Other symptoms and signs involving the musculoskeletal system: Secondary | ICD-10-CM | POA: Insufficient documentation

## 2023-10-31 DIAGNOSIS — M2550 Pain in unspecified joint: Secondary | ICD-10-CM | POA: Insufficient documentation

## 2023-10-31 NOTE — Therapy (Signed)
 OUTPATIENT PHYSICAL THERAPY LOWER EXTREMITY TREATMENT     Patient Name: Taylor Mccarthy MRN: [id number] DOB:[birth year], 83 y.o., female Today's Date: 10/31/23   END OF SESSION:   PT End of Session - 10/31/23 1718       Visit Number 4    Number of Visits 17     Date for PT Re-Evaluation 12/10/23     Authorization Type 2x/week x 8 weeks (recert needed 11/07/79, PN at 10th visit)     PT Start Time 1500    PT Stop Time 1545    PT Time Calculation (min) 45 min     Activity Tolerance Patient tolerated treatment well     Behavior During Therapy WFL for tasks assessed/performed                       Past Medical History:  Diagnosis Date   Atrial fibrillation, persistent (HCC)     Cancer (HCC)      colon cancer 1999 s/p resection    History of colon polyps     Insomnia     Low back pain     Mitral regurgitation     Pap smear abnormality of cervix with ASCUS favoring benign     Permanent atrial fibrillation (HCC) 08/06/2022   Sciatica     Vitamin D deficiency               Past Surgical History:  Procedure Laterality Date   COLON RESECTION        1999 for cancer    TONSILLECTOMY       WISDOM TOOTH EXTRACTION                Patient Active Problem List    Diagnosis Date Noted   Arthritis 09/10/2023   Polyarthralgia 09/10/2023   OAB (overactive bladder) 09/10/2023   Muscle spasm 03/11/2023   PVD (peripheral vascular disease) (HCC) 10/20/2022   History of pelvic fracture 10/20/2022   Anemia 10/20/2022   Acute dermatitis 10/16/2022   Atrophic vaginitis 10/16/2022   Candidiasis of vulva 10/16/2022   Fatigue 10/16/2022   Hip pain 10/16/2022   Leukorrhea 10/16/2022   Low back pain 10/16/2022   Motion sickness 10/16/2022   Neck sprain 10/16/2022   Osteoarthritis of hip 10/16/2022   Pain in limb 10/16/2022   Sciatica 10/16/2022   Lipid screening 08/06/2022   Chronic heart failure with preserved ejection fraction (HCC) 08/06/2022   Sacral fracture, closed (HCC)      Rhabdomyolysis 11/13/2021   Pelvic fracture (HCC) 11/12/2021   Overactive bladder 06/27/2021   Osteopenia 05/25/2021   Abnormal thyroid blood test 09/11/2019   Macrocytosis 09/11/2019   Mood disorder (HCC) 05/07/2019   Abnormal gait 05/07/2019   Urinary incontinence 05/07/2019   Longstanding persistent atrial fibrillation (HCC) 05/28/2018   History of colon cancer 05/28/2018   Vertigo 05/28/2018   DDD (degenerative disc disease), lumbar 05/28/2018   Hypothyroidism 05/28/2018   Cough 05/28/2018   Vitamin D deficiency 05/28/2018   Insomnia 05/28/2018   Lesion of oral mucosa 05/28/2018   Cardiac arrhythmia 05/21/2018   Allergies 05/21/2018   Non-seasonal allergic rhinitis 05/08/2018   Unsteadiness 05/08/2018      PCP: Dr. Clent Ridges, MD   REFERRING PROVIDER: same   REFERRING DIAG: M25.50 (ICD-10-CM) - Polyarthralgia    THERAPY DIAG:  Polyarthralgia   Hip weakness   Muscle weakness (generalized)   Rationale for Evaluation and Treatment: Rehabilitation   ONSET DATE: 2023- pelvic fx  SUBJECTIVE:    SUBJECTIVE STATEMENT: Pt has a few concerns today: she reports pain/achy sensation in pelvis/hips and like her strength is limited after her pelvic fx last year; also having some dull/achy pain in wrist/hands and arms which she attributes to arthritis.  Planning to focus on lower body concerns today.   She feels like her legs are weak and her lower back is weak too.  She was very active prior to the fall.   She noticed sx ~3 months ago, but overall has had a challenging year including moving into a new home and navigating the unexpected loss of a close family member (daughter).   Feels like her walking is impaired.  Using a SPC for amb.   Fear of falling even though she has not fallen since the injury that resulted in pelvic fx last year.   She has been using the [facility] since the initial injury.  She mainly uses it out of the home.  She has a rollator at home she uses.   She  does report feeling unsteady/dizzy/vertigo feelings which began after her fall.  Has not been evaluated for this yet.     PERTINENT HISTORY: Larey Seat and fx pelvis- no surgery; did a course of inpatient rehab and PT after that PAIN:  Are you having pain? Yes 4/10   PRECAUTIONS: Fall   RED FLAGS: None      WEIGHT BEARING RESTRICTIONS: No   FALLS:  Has patient fallen in last 6 months? No; has had a few "wobbles"   LIVING ENVIRONMENT: Lives with: alone, she moved into her daughter's home Lives in: House/apartment Stairs: No no stairs; 1 step up but that's it Has following equipment at home: Single point cane; able to drive and takes care of her housework   OCCUPATION: retired; Conservation officer, historic buildings Programme researcher, broadcasting/film/video), worked in Engineer, production, Social research officer, government, painting    PLOF: Independent   PATIENT GOALS: strengthen her lower back and legs    NEXT MD VISIT: none scheduled    OBJECTIVE:  Note: Objective measures were completed at Evaluation unless otherwise noted.   DIAGNOSTIC FINDINGS: none recently, per chart review- April 2023 x-ray report pelvis/sacrum/coccyx   IMPRESSION: Acute displaced fractures of the right and left superior and inferior pubic rami.   IMPRESSION: Displaced fracture is seen in the right iliac bone in the medial margin of right acetabulum. Comminuted fractures are seen in the left superior pubic ramus and right inferior pubic ramus. These fractures are better evaluated in the radiographs of hip done today.   PATIENT SURVEYS:  LEFS 42/80   COGNITION: Overall cognitive status: Within functional limits for tasks assessed                         SENSATION: WFL   POSTURE: pt amb with SPC in R UE today, decreased gait speed and slight trunk flexion/hip flexion   PALPATION: Specific palpation deferred   LOWER EXTREMITY ROM:   Active ROM Right eval Left eval  Hip flexion Min limited compared to L normal  Hip extension      Hip abduction       Hip adduction      Hip internal rotation Min limited compared to L normal  Hip external rotation      Knee flexion normal normal  Knee extension normal normal  Ankle dorsiflexion      Ankle plantarflexion      Ankle inversion      Ankle eversion       (  Blank rows = not tested)   LOWER EXTREMITY MMT:   MMT Right eval Left eval  Hip flexion 3+ 5  Hip extension 3+ 4+  Hip abduction 3+ 4+  Hip adduction      Hip internal rotation 3+ 4  Hip external rotation 4 4  Knee flexion 5 5  Knee extension 4 5  Ankle dorsiflexion      Ankle plantarflexion      Ankle inversion      Ankle eversion       (Blank rows = not tested)   LOWER EXTREMITY SPECIAL TESTS:  (-) seated slump test   FUNCTIONAL TESTS:  5 times sit to stand: pt compensates with leaning femur on edge of plinth instead of using LE muscles; cues for "stand tall" used;  3 attempts of 5X STS performed today; best time was 15 seconds, but pt unable to perform without use of UE or LE substitutions   GAIT: Distance walked: into clinic with SPC in R UE and SPC appears to be slightly taller than ideal height, will adjust as needed at next visit                                                                                                            TREATMENT DATE: 10/31/23 Subjective: Pt using SPC in R hand, more comfortable; using rollator at home.  No falls.  She had muscle soreness after last PT and felt like it was an appropriately challenging session; soreness resolved after the second day.  0/10 pain  Objective:  Therapeutic exercise: Sit to stand: practiced from standard chair: x5; 4 sets PT verbal/visual/tactile cues for using hip hinge; scooting hips forward and bending knees slightly for optimal body mechanics Mini squat: 2x10 Seated marches: 2x10 3# Seated hip abd: with PT manual resistance x15 (green band 2x10) Supine hip add: 5 second holds with PT manual resistance x15 Bridges: 2x10 (PT stabilized feet) SLR  x20 ea Standing hip abd: 2# 2x10 ea Standing hip extension: 2# 2x10 ea Standing hamstring curl: 2# 2x10 ea PROM hip ER, flexion, hamstring stretch (pt reports "cramping" during exercises) throughout session: 4 min   Neuro re-ed: Practiced ambulating with SPC in R hand instead of L; focused on upright posture, heel strike at initial contact, knee flexion during swing phase to promote normal mobility and balance while amb with SPC on flat surfaces- 5 min Abdominal bracing with diaphragmatic breathing: exhale + brace x 15, will benefit from more practice, had difficulty with coordination today  SLS- not today  HEP instruction    PATIENT EDUCATION:  Education details: PT POC/goals, purpose and benefit of LE strengthening to improve balance and gait; discussed using SPC out of home and rollator in home; discussed exercise goals, HEP, instructions for exercises within PT session   Person educated: Patient Education method: Explanation and Verbal cues Education comprehension: verbalized understanding and needs further education   HOME EXERCISE PROGRAM: Practice using SPC on L UE  Access Code: [email or web address] URL: [email or web address] Date: 10/17/2023 Prepared by: Max Fickle  Exercises - Supine Bridge  - 1 x daily - 7 x weekly - 2 sets - 10 reps - 3 hold - Seated Hip Abduction with Resistance  - 1 x daily - 7 x weekly - 2 sets - 10 reps - Sit to Stand  - 1 x daily - 7 x weekly - 3 sets - 5 reps - Seated March  - 1 x daily - 7 x weekly - 2 sets - 10 reps   ASSESSMENT:   CLINICAL IMPRESSION: Patient was challenged with LE strengthening; demonstrates weakness in standing, sitting, and supine exercises today.  Demonstrates improved body mechanics for sit to stand.  Continues to resort to habit of using SPC in R hand instead of L; will continue to focus on safety with this comfortable self selected pattern.  Improved trunk stability noted with bridges today.  Progressed LE  strengthening with increased resistance and no increase in pain noted during or after tx.  She is very motivated to participate in PT.  Overall she should continue to benefit from skilled PT treatment to address LE weakness/difficulty walking/decreased activity tolerance/decreased balance/fear of falling/gait abnormalities after sustaining a pelvic fx in 2023.   OBJECTIVE IMPAIRMENTS: Abnormal gait, decreased balance, decreased mobility, difficulty walking, decreased strength, postural dysfunction, and pain.    ACTIVITY LIMITATIONS: carrying, lifting, bending, squatting, stairs, and locomotion level   PARTICIPATION LIMITATIONS: meal prep, cleaning, shopping, community activity, and yard work   PERSONAL FACTORS: Age and Time since onset of injury/illness/exacerbation are also affecting patient's functional outcome.    REHAB POTENTIAL: Good   CLINICAL DECISION MAKING: Stable/uncomplicated   EVALUATION COMPLEXITY: Low     GOALS: Goals reviewed with patient? Yes   SHORT TERM GOALS: Target date: 10/30/23 Pt will be able to perform STS without use of LE or UE compensatory patterns from elevated chair height 24 inches x5 to facilitate improved LE strength, balance, and walking endurance Baseline: unable Goal status: INITIAL       LONG TERM GOALS: Target date: 12/10/23   Improve LEFS >18 indicating pt able to perform her daily activities at home without being limited by LE weakness/pain Baseline: 42/80 (MCID is 9) Goal status: INITIAL   2.  Improve LE strength 1/2 MMT grade to promote pt being able to perform 30 min heavy housework, squat, get in and out of her car without being limited by LE weakness Baseline:  Goal status: INITIAL   3.  Pt will amb x 10 minutes and no loss of balance with least restrictive AD to amb into out of stores and around her home for housework without being limited by LE pain and weakness  Baseline:  Goal status: INITIAL     PLAN:   PT FREQUENCY: 2x/week    PT DURATION: 8 weeks   PLANNED INTERVENTIONS: 97110-Therapeutic exercises, 97530- Therapeutic activity, [id number]- Neuromuscular re-education, 97535- Self Care, and [id number]- Manual therapy   PLAN FOR NEXT SESSION: strengthening, balance, gait training (BERG next visit)   Max Fickle, PT, DPT, OCS   Ardine Bjork, PT 10/31/2023, 5:20 PM

## 2023-11-05 ENCOUNTER — Ambulatory Visit

## 2023-11-05 DIAGNOSIS — M2550 Pain in unspecified joint: Secondary | ICD-10-CM | POA: Diagnosis not present

## 2023-11-05 DIAGNOSIS — R29898 Other symptoms and signs involving the musculoskeletal system: Secondary | ICD-10-CM

## 2023-11-05 DIAGNOSIS — M6281 Muscle weakness (generalized): Secondary | ICD-10-CM | POA: Diagnosis not present

## 2023-11-05 NOTE — Therapy (Signed)
 OUTPATIENT PHYSICAL THERAPY LOWER EXTREMITY TREATMENT     Patient Name: Taylor Mccarthy MRN: [id number] DOB:05/16/1941, 83 y.o., female Today's Date: 10/31/23   END OF SESSION:   PT End of Session - 11/05/23 1718       Visit Number 5    Number of Visits 17     Date for PT Re-Evaluation 12/10/23     Authorization Type 2x/week x 8 weeks (recert needed 0/86/57, PN at 10th visit)     PT Start Time 1415    PT Stop Time 1500    PT Time Calculation (min) 45 min     Activity Tolerance Patient tolerated treatment well     Behavior During Therapy [facility] for tasks assessed/performed                       Past Medical History:  Diagnosis Date   Atrial fibrillation, persistent (HCC)     Cancer (HCC)      colon cancer 1999 s/p resection    History of colon polyps     Insomnia     Low back pain     Mitral regurgitation     Pap smear abnormality of cervix with ASCUS favoring benign     Permanent atrial fibrillation (HCC) 08/06/2022   Sciatica     Vitamin D deficiency               Past Surgical History:  Procedure Laterality Date   COLON RESECTION        1999 for cancer    TONSILLECTOMY       WISDOM TOOTH EXTRACTION                Patient Active Problem List    Diagnosis Date Noted   Arthritis 09/10/2023   Polyarthralgia 09/10/2023   OAB (overactive bladder) 09/10/2023   Muscle spasm 03/11/2023   PVD (peripheral vascular disease) (HCC) 10/20/2022   History of pelvic fracture 10/20/2022   Anemia 10/20/2022   Acute dermatitis 10/16/2022   Atrophic vaginitis 10/16/2022   Candidiasis of vulva 10/16/2022   Fatigue 10/16/2022   Hip pain 10/16/2022   Leukorrhea 10/16/2022   Low back pain 10/16/2022   Motion sickness 10/16/2022   Neck sprain 10/16/2022   Osteoarthritis of hip 10/16/2022   Pain in limb 10/16/2022   Sciatica 10/16/2022   Lipid screening 08/06/2022   Chronic heart failure with preserved ejection fraction (HCC) 08/06/2022   Sacral fracture, closed (HCC)      Rhabdomyolysis 11/13/2021   Pelvic fracture (HCC) 11/12/2021   Overactive bladder 06/27/2021   Osteopenia 05/25/2021   Abnormal thyroid blood test 09/11/2019   Macrocytosis 09/11/2019   Mood disorder (HCC) 05/07/2019   Abnormal gait 05/07/2019   Urinary incontinence 05/07/2019   Longstanding persistent atrial fibrillation (HCC) 05/28/2018   History of colon cancer 05/28/2018   Vertigo 05/28/2018   DDD (degenerative disc disease), lumbar 05/28/2018   Hypothyroidism 05/28/2018   Cough 05/28/2018   Vitamin D deficiency 05/28/2018   Insomnia 05/28/2018   Lesion of oral mucosa 05/28/2018   Cardiac arrhythmia 05/21/2018   Allergies 05/21/2018   Non-seasonal allergic rhinitis 05/08/2018   Unsteadiness 05/08/2018      PCP: Dr. Clent Ridges, MD   REFERRING PROVIDER: same   REFERRING DIAG: M25.50 (ICD-10-CM) - Polyarthralgia    THERAPY DIAG:  Polyarthralgia   Hip weakness   Muscle weakness (generalized)   Rationale for Evaluation and Treatment: Rehabilitation   ONSET DATE: 2023- pelvic fx  SUBJECTIVE:    SUBJECTIVE STATEMENT: Pt has a few concerns today: she reports pain/achy sensation in pelvis/hips and like her strength is limited after her pelvic fx last year; also having some dull/achy pain in wrist/hands and arms which she attributes to arthritis.  Planning to focus on lower body concerns today.   She feels like her legs are weak and her lower back is weak too.  She was very active prior to the fall.   She noticed sx ~3 months ago, but overall has had a challenging year including moving into a new home and navigating the unexpected loss of a close family member (daughter).   Feels like her walking is impaired.  Using a SPC for amb.   Fear of falling even though she has not fallen since the injury that resulted in pelvic fx last year.   She has been using the [facility] since the initial injury.  She mainly uses it out of the home.  She has a rollator at home she uses.   She  does report feeling unsteady/dizzy/vertigo feelings which began after her fall.  Has not been evaluated for this yet.     PERTINENT HISTORY: Larey Seat and fx pelvis- no surgery; did a course of inpatient rehab and PT after that PAIN:  Are you having pain? Yes 4/10   PRECAUTIONS: Fall   RED FLAGS: None      WEIGHT BEARING RESTRICTIONS: No   FALLS:  Has patient fallen in last 6 months? No; has had a few "wobbles"   LIVING ENVIRONMENT: Lives with: alone, she moved into her daughter's home Lives in: House/apartment Stairs: No no stairs; 1 step up but that's it Has following equipment at home: Single point cane; able to drive and takes care of her housework   OCCUPATION: retired; Conservation officer, historic buildings Programme researcher, broadcasting/film/video), worked in Engineer, production, Social research officer, government, painting    PLOF: Independent   PATIENT GOALS: strengthen her lower back and legs    NEXT MD VISIT: none scheduled    OBJECTIVE:  Note: Objective measures were completed at Evaluation unless otherwise noted.   DIAGNOSTIC FINDINGS: none recently, per chart review- April 2023 x-ray report pelvis/sacrum/coccyx   IMPRESSION: Acute displaced fractures of the right and left superior and inferior pubic rami.   IMPRESSION: Displaced fracture is seen in the right iliac bone in the medial margin of right acetabulum. Comminuted fractures are seen in the left superior pubic ramus and right inferior pubic ramus. These fractures are better evaluated in the radiographs of hip done today.   PATIENT SURVEYS:  LEFS 42/80   COGNITION: Overall cognitive status: Within functional limits for tasks assessed                         SENSATION: WFL   POSTURE: pt amb with SPC in R UE today, decreased gait speed and slight trunk flexion/hip flexion   PALPATION: Specific palpation deferred   LOWER EXTREMITY ROM:   Active ROM Right eval Left eval  Hip flexion Min limited compared to L normal  Hip extension      Hip abduction       Hip adduction      Hip internal rotation Min limited compared to L normal  Hip external rotation      Knee flexion normal normal  Knee extension normal normal  Ankle dorsiflexion      Ankle plantarflexion      Ankle inversion      Ankle eversion       (  Blank rows = not tested)   LOWER EXTREMITY MMT:   MMT Right eval Left eval  Hip flexion 3+ 5  Hip extension 3+ 4+  Hip abduction 3+ 4+  Hip adduction      Hip internal rotation 3+ 4  Hip external rotation 4 4  Knee flexion 5 5  Knee extension 4 5  Ankle dorsiflexion      Ankle plantarflexion      Ankle inversion      Ankle eversion       (Blank rows = not tested)   LOWER EXTREMITY SPECIAL TESTS:  (-) seated slump test   FUNCTIONAL TESTS:  5 times sit to stand: pt compensates with leaning femur on edge of plinth instead of using LE muscles; cues for "stand tall" used;  3 attempts of 5X STS performed today; best time was 15 seconds, but pt unable to perform without use of UE or LE substitutions   GAIT: Distance walked: into clinic with SPC in R UE and SPC appears to be slightly taller than ideal height, will adjust as needed at next visit                                                                                                            TREATMENT DATE: 11/05/23 Subjective: Pt feels like balance is still a concern; no falls.  She uses her [facility] when she is out or uses rollator in her home.  She has been working on her HEP.    0/10 pain  Objective:  Therapeutic exercise: Sit to stand: practiced from standard chair: x5; 4 sets PT verbal/visual/tactile cues for using hip hinge; scooting hips forward and bending knees slightly for optimal body mechanics Mini squat: 2x10 Seated marches: 2x10 3# Seated hip abd: with PT manual resistance x15 (blueband 2x10) Supine hip add: 5 second holds with PT manual resistance x15 Bridges: 2x10 (PT stabilized feet)- not today SLR x20 ea- not today Standing hip abd: 3# 2x10  ea Standing hip extension: 3# 2x10 ea Standing hamstring curl: 3# 2x10 ea PROM hip ER, flexion, hamstring stretch (pt reports "cramping" during exercises) throughout session: 4 min- not today Front step ups: x12 ea side   Neuro re-ed: not today Practiced ambulating with SPC in R hand instead of L; focused on upright posture, heel strike at initial contact, knee flexion during swing phase to promote normal mobility and balance while amb with SPC on flat surfaces- 5 min Abdominal bracing with diaphragmatic breathing: exhale + brace x 15, will benefit from more practice, had difficulty with coordination today SLS- not today  Therapeutic activities:  Forward hurdles: 5 laps, practiced with single UE support Stair navigation: 3 laps (4 steps) with b/l railing  HEP instruction    PATIENT EDUCATION:  Education details: PT POC/goals, purpose and benefit of LE strengthening to improve balance and gait; discussed using SPC out of home and rollator in home; discussed exercise goals, HEP, instructions for exercises within PT session   Person educated: Patient Education method: Explanation and Verbal cues Education  comprehension: verbalized understanding and needs further education   HOME EXERCISE PROGRAM: Practice using SPC on L UE  Access Code: [email or web address] URL: [email or web address] Date: 10/17/2023 Prepared by: Max Fickle  Exercises - Supine Bridge  - 1 x daily - 7 x weekly - 2 sets - 10 reps - 3 hold - Seated Hip Abduction with Resistance  - 1 x daily - 7 x weekly - 2 sets - 10 reps - Sit to Stand  - 1 x daily - 7 x weekly - 3 sets - 5 reps - Seated March  - 1 x daily - 7 x weekly - 2 sets - 10 reps   ASSESSMENT:   CLINICAL IMPRESSION: Patient was challenged with LE strengthening; she is motivated to participate in tx session.  Does respond well to verbal cues for exercise technique improvement as she has difficulty with form during sit to stand and during standing  hip abd.  No significant myotomal ankle DF/inversion weakness noted- performed MMT to assess as pt reports concern of needing to "pick up feet" sometimes when walking.  Suspect her lumbopelvic weakness with compensatory wider base of support is contributing to this difficulty.  Progressed LE strengthening with increased resistance and no increase in pain noted during or after tx.  She is very motivated to participate in PT.  Overall she should continue to benefit from skilled PT treatment to address LE weakness/difficulty walking/decreased activity tolerance/decreased balance/fear of falling/gait abnormalities after sustaining a pelvic fx in 2023.   OBJECTIVE IMPAIRMENTS: Abnormal gait, decreased balance, decreased mobility, difficulty walking, decreased strength, postural dysfunction, and pain.    ACTIVITY LIMITATIONS: carrying, lifting, bending, squatting, stairs, and locomotion level   PARTICIPATION LIMITATIONS: meal prep, cleaning, shopping, community activity, and yard work   PERSONAL FACTORS: Age and Time since onset of injury/illness/exacerbation are also affecting patient's functional outcome.    REHAB POTENTIAL: Good   CLINICAL DECISION MAKING: Stable/uncomplicated   EVALUATION COMPLEXITY: Low     GOALS: Goals reviewed with patient? Yes   SHORT TERM GOALS: Target date: 10/30/23 Pt will be able to perform STS without use of LE or UE compensatory patterns from elevated chair height 24 inches x5 to facilitate improved LE strength, balance, and walking endurance Baseline: unable Goal status: INITIAL       LONG TERM GOALS: Target date: 12/10/23   Improve LEFS >18 indicating pt able to perform her daily activities at home without being limited by LE weakness/pain Baseline: 42/80 (MCID is 9) Goal status: INITIAL   2.  Improve LE strength 1/2 MMT grade to promote pt being able to perform 30 min heavy housework, squat, get in and out of her car without being limited by LE  weakness Baseline:  Goal status: INITIAL   3.  Pt will amb x 10 minutes and no loss of balance with least restrictive AD to amb into out of stores and around her home for housework without being limited by LE pain and weakness  Baseline:  Goal status: INITIAL     PLAN:   PT FREQUENCY: 2x/week   PT DURATION: 8 weeks   PLANNED INTERVENTIONS: 97110-Therapeutic exercises, 97530- Therapeutic activity, [id number]- Neuromuscular re-education, 97535- Self Care, and [id number]- Manual therapy   PLAN FOR NEXT SESSION: strengthening, balance, gait training (BERG next visit)   Max Fickle, PT, DPT, OCS   Ardine Bjork, PT 11/05/2023, 5:20 PM

## 2023-11-07 ENCOUNTER — Ambulatory Visit

## 2023-11-07 DIAGNOSIS — M2550 Pain in unspecified joint: Secondary | ICD-10-CM | POA: Diagnosis not present

## 2023-11-07 DIAGNOSIS — R29898 Other symptoms and signs involving the musculoskeletal system: Secondary | ICD-10-CM | POA: Diagnosis not present

## 2023-11-07 DIAGNOSIS — M6281 Muscle weakness (generalized): Secondary | ICD-10-CM

## 2023-11-07 NOTE — Therapy (Signed)
 OUTPATIENT PHYSICAL THERAPY LOWER EXTREMITY TREATMENT     Patient Name: Taylor Mccarthy MRN: [id number] DOB:07-May-1941, 83 y.o., female Today's Date: 11/07/23   END OF SESSION:   PT End of Session - 11/07/23 1718       Visit Number 6    Number of Visits 17     Date for PT Re-Evaluation 12/10/23     Authorization Type 2x/week x 8 weeks (recert needed 7/82/95, PN at 10th visit)     PT Start Time 1500    PT Stop Time 1545    PT Time Calculation (min) 45 min     Activity Tolerance Patient tolerated treatment well     Behavior During Therapy WFL for tasks assessed/performed                       Past Medical History:  Diagnosis Date   Atrial fibrillation, persistent (HCC)     Cancer (HCC)      colon cancer 1999 s/p resection    History of colon polyps     Insomnia     Low back pain     Mitral regurgitation     Pap smear abnormality of cervix with ASCUS favoring benign     Permanent atrial fibrillation (HCC) 08/06/2022   Sciatica     Vitamin D deficiency               Past Surgical History:  Procedure Laterality Date   COLON RESECTION        1999 for cancer    TONSILLECTOMY       WISDOM TOOTH EXTRACTION                Patient Active Problem List    Diagnosis Date Noted   Arthritis 09/10/2023   Polyarthralgia 09/10/2023   OAB (overactive bladder) 09/10/2023   Muscle spasm 03/11/2023   PVD (peripheral vascular disease) (HCC) 10/20/2022   History of pelvic fracture 10/20/2022   Anemia 10/20/2022   Acute dermatitis 10/16/2022   Atrophic vaginitis 10/16/2022   Candidiasis of vulva 10/16/2022   Fatigue 10/16/2022   Hip pain 10/16/2022   Leukorrhea 10/16/2022   Low back pain 10/16/2022   Motion sickness 10/16/2022   Neck sprain 10/16/2022   Osteoarthritis of hip 10/16/2022   Pain in limb 10/16/2022   Sciatica 10/16/2022   Lipid screening 08/06/2022   Chronic heart failure with preserved ejection fraction (HCC) 08/06/2022   Sacral fracture, closed (HCC)      Rhabdomyolysis 11/13/2021   Pelvic fracture (HCC) 11/12/2021   Overactive bladder 06/27/2021   Osteopenia 05/25/2021   Abnormal thyroid blood test 09/11/2019   Macrocytosis 09/11/2019   Mood disorder (HCC) 05/07/2019   Abnormal gait 05/07/2019   Urinary incontinence 05/07/2019   Longstanding persistent atrial fibrillation (HCC) 05/28/2018   History of colon cancer 05/28/2018   Vertigo 05/28/2018   DDD (degenerative disc disease), lumbar 05/28/2018   Hypothyroidism 05/28/2018   Cough 05/28/2018   Vitamin D deficiency 05/28/2018   Insomnia 05/28/2018   Lesion of oral mucosa 05/28/2018   Cardiac arrhythmia 05/21/2018   Allergies 05/21/2018   Non-seasonal allergic rhinitis 05/08/2018   Unsteadiness 05/08/2018      PCP: Dr. Sueanne Emerald, MD   REFERRING PROVIDER: same   REFERRING DIAG: M25.50 (ICD-10-CM) - Polyarthralgia    THERAPY DIAG:  Polyarthralgia   Hip weakness   Muscle weakness (generalized)   Rationale for Evaluation and Treatment: Rehabilitation   ONSET DATE: 2023- pelvic fx  SUBJECTIVE:    SUBJECTIVE STATEMENT: Pt has a few concerns today: she reports pain/achy sensation in pelvis/hips and like her strength is limited after her pelvic fx last year; also having some dull/achy pain in wrist/hands and arms which she attributes to arthritis.  Planning to focus on lower body concerns today.   She feels like her legs are weak and her lower back is weak too.  She was very active prior to the fall.   She noticed sx ~3 months ago, but overall has had a challenging year including moving into a new home and navigating the unexpected loss of a close family member (daughter).   Feels like her walking is impaired.  Using a SPC for amb.   Fear of falling even though she has not fallen since the injury that resulted in pelvic fx last year.   She has been using the [facility] since the initial injury.  She mainly uses it out of the home.  She has a rollator at home she uses.   She  does report feeling unsteady/dizzy/vertigo feelings which began after her fall.  Has not been evaluated for this yet.     PERTINENT HISTORY: Larey Seat and fx pelvis- no surgery; did a course of inpatient rehab and PT after that PAIN:  Are you having pain? Yes 4/10   PRECAUTIONS: Fall   RED FLAGS: None      WEIGHT BEARING RESTRICTIONS: No   FALLS:  Has patient fallen in last 6 months? No; has had a few "wobbles"   LIVING ENVIRONMENT: Lives with: alone, she moved into her daughter's home Lives in: House/apartment Stairs: No no stairs; 1 step up but that's it Has following equipment at home: Single point cane; able to drive and takes care of her housework   OCCUPATION: retired; Conservation officer, historic buildings Programme researcher, broadcasting/film/video), worked in Engineer, production, Social research officer, government, painting    PLOF: Independent   PATIENT GOALS: strengthen her lower back and legs    NEXT MD VISIT: none scheduled    OBJECTIVE:  Note: Objective measures were completed at Evaluation unless otherwise noted.   DIAGNOSTIC FINDINGS: none recently, per chart review- April 2023 x-ray report pelvis/sacrum/coccyx   IMPRESSION: Acute displaced fractures of the right and left superior and inferior pubic rami.   IMPRESSION: Displaced fracture is seen in the right iliac bone in the medial margin of right acetabulum. Comminuted fractures are seen in the left superior pubic ramus and right inferior pubic ramus. These fractures are better evaluated in the radiographs of hip done today.   PATIENT SURVEYS:  LEFS 42/80   COGNITION: Overall cognitive status: Within functional limits for tasks assessed                         SENSATION: WFL   POSTURE: pt amb with SPC in R UE today, decreased gait speed and slight trunk flexion/hip flexion   PALPATION: Specific palpation deferred   LOWER EXTREMITY ROM:   Active ROM Right eval Left eval  Hip flexion Min limited compared to L normal  Hip extension      Hip abduction       Hip adduction      Hip internal rotation Min limited compared to L normal  Hip external rotation      Knee flexion normal normal  Knee extension normal normal  Ankle dorsiflexion      Ankle plantarflexion      Ankle inversion      Ankle eversion       (  Blank rows = not tested)   LOWER EXTREMITY MMT:   MMT Right eval Left eval  Hip flexion 3+ 5  Hip extension 3+ 4+  Hip abduction 3+ 4+  Hip adduction      Hip internal rotation 3+ 4  Hip external rotation 4 4  Knee flexion 5 5  Knee extension 4 5  Ankle dorsiflexion      Ankle plantarflexion      Ankle inversion      Ankle eversion       (Blank rows = not tested)   LOWER EXTREMITY SPECIAL TESTS:  (-) seated slump test   FUNCTIONAL TESTS:  5 times sit to stand: pt compensates with leaning femur on edge of plinth instead of using LE muscles; cues for "stand tall" used;  3 attempts of 5X STS performed today; best time was 15 seconds, but pt unable to perform without use of UE or LE substitutions   GAIT: Distance walked: into clinic with SPC in R UE and SPC appears to be slightly taller than ideal height, will adjust as needed at next visit                                                                                                            TREATMENT DATE: 11/07/23 Subjective: Pt feels like balance is still a concern; no falls.  No new concerns today.  She uses her [facility] when she is out or uses rollator in her home.  She has been working on her HEP.    0/10 pain  Objective:  Therapeutic exercise: Sit to stand: practiced from standard chair: x5; 4 sets PT verbal/visual/tactile cues for using hip hinge; scooting hips forward and bending knees slightly for optimal body mechanics Mini squat: 2x10 Seated marches: 2x10 3# Seated hip abd: with PT manual resistance x15 (blueband 2x10) Supine hip add: 5 second holds with PT manual resistance x15 Bridges: 2x10 (PT stabilized feet) SLR x20 ea- not today Standing hip abd: 3#  2x10 ea Standing hip extension: 3# 2x10 ea Standing hamstring curl: 3# 2x10 ea PROM hip ER, flexion, hamstring stretch (pt reports "cramping" during exercises) throughout session: 4 min- not today    Neuro re-ed: not today Abdominal bracing with diaphragmatic breathing: exhale + brace x 15, will benefit from more practice, had difficulty with coordination today SLS 3x ea side x 10-15 seconds  Therapeutic activities:  Forward hurdles: 5 laps, practiced with single UE support Stair navigation: 3 laps (4 steps) with b/l railing Front step ups: 12x ea side Heel raises: 2x20- focus on posterior chain mm activation during amb- pushoff phase of gait  HEP instruction    PATIENT EDUCATION:  Education details: PT POC/goals, purpose and benefit of LE strengthening to improve balance and gait; discussed using SPC out of home and rollator in home; discussed exercise goals, HEP, instructions for exercises within PT session   Person educated: Patient Education method: Explanation and Verbal cues Education comprehension: verbalized understanding and needs further education   HOME EXERCISE PROGRAM: Practice using SPC on  L UE  Access Code: [email or web address] URL: [email or web address] Date: 10/17/2023 Prepared by: Lucrecia Sables  Exercises - Supine Bridge  - 1 x daily - 7 x weekly - 2 sets - 10 reps - 3 hold - Seated Hip Abduction with Resistance  - 1 x daily - 7 x weekly - 2 sets - 10 reps - Sit to Stand  - 1 x daily - 7 x weekly - 3 sets - 5 reps - Seated March  - 1 x daily - 7 x weekly - 2 sets - 10 reps   ASSESSMENT:   CLINICAL IMPRESSION: Patient was challenged with LE strengthening; she is motivated to participate in tx session.  Does respond well to verbal cues for exercise technique improvement as she has difficulty with form during sit to stand and during standing hip abd.  Pt lacks full posterior chain (gluteals, gastroc mm) activation during terminal stance/push off phase of  gait resulting in short stride/wide base of support.  Will continue working on this.  Overall she should continue to benefit from skilled PT treatment to address LE weakness/difficulty walking/decreased activity tolerance/decreased balance/fear of falling/gait abnormalities after sustaining a pelvic fx in 2023.   OBJECTIVE IMPAIRMENTS: Abnormal gait, decreased balance, decreased mobility, difficulty walking, decreased strength, postural dysfunction, and pain.    ACTIVITY LIMITATIONS: carrying, lifting, bending, squatting, stairs, and locomotion level   PARTICIPATION LIMITATIONS: meal prep, cleaning, shopping, community activity, and yard work   PERSONAL FACTORS: Age and Time since onset of injury/illness/exacerbation are also affecting patient's functional outcome.    REHAB POTENTIAL: Good   CLINICAL DECISION MAKING: Stable/uncomplicated   EVALUATION COMPLEXITY: Low     GOALS: Goals reviewed with patient? Yes   SHORT TERM GOALS: Target date: 10/30/23 Pt will be able to perform STS without use of LE or UE compensatory patterns from elevated chair height 24 inches x5 to facilitate improved LE strength, balance, and walking endurance Baseline: unable Goal status: INITIAL       LONG TERM GOALS: Target date: 12/10/23   Improve LEFS >18 indicating pt able to perform her daily activities at home without being limited by LE weakness/pain Baseline: 42/80 (MCID is 9) Goal status: INITIAL   2.  Improve LE strength 1/2 MMT grade to promote pt being able to perform 30 min heavy housework, squat, get in and out of her car without being limited by LE weakness Baseline:  Goal status: INITIAL   3.  Pt will amb x 10 minutes and no loss of balance with least restrictive AD to amb into out of stores and around her home for housework without being limited by LE pain and weakness  Baseline:  Goal status: INITIAL     PLAN:   PT FREQUENCY: 2x/week   PT DURATION: 8 weeks   PLANNED INTERVENTIONS:  97110-Therapeutic exercises, 97530- Therapeutic activity, [id number]- Neuromuscular re-education, 97535- Self Care, and 47829- Manual therapy   PLAN FOR NEXT SESSION: strengthening, balance, gait training (BERG next visit)   Lucrecia Sables, PT, DPT, OCS   Aldo Sondgeroth E Quitman Norberto, PT 11/05/2023, 5:20 PM

## 2023-11-12 ENCOUNTER — Ambulatory Visit

## 2023-11-12 ENCOUNTER — Telehealth: Payer: Self-pay | Admitting: Family Medicine

## 2023-11-12 ENCOUNTER — Telehealth: Payer: Self-pay

## 2023-11-12 DIAGNOSIS — R29898 Other symptoms and signs involving the musculoskeletal system: Secondary | ICD-10-CM | POA: Diagnosis not present

## 2023-11-12 DIAGNOSIS — M2550 Pain in unspecified joint: Secondary | ICD-10-CM

## 2023-11-12 DIAGNOSIS — M6281 Muscle weakness (generalized): Secondary | ICD-10-CM

## 2023-11-12 NOTE — Telephone Encounter (Signed)
 Copied from CRM [id number]. Topic: General - Call Back - No Documentation >> Nov 12, 2023 11:08 AM Taylor Mccarthy wrote: Reason for CRM: Patient returning call from [facility], relayed message that was left and patient stated she is requesting a referral for a Rheumatologist.

## 2023-11-12 NOTE — Addendum Note (Signed)
 Addended by: Barb Levers on: 11/12/2023 11:01 AM   Modules accepted: Orders

## 2023-11-12 NOTE — Telephone Encounter (Signed)
 Left message to return call to our office.  Okay to relay message to pt. Please document when pt is spoke to.

## 2023-11-12 NOTE — Telephone Encounter (Signed)
 Copied from CRM [id number]. Topic: Referral - Request for Referral >> Nov 12, 2023  9:15 AM Freya Jesus wrote: Did the patient discuss referral with their provider in the last year? Yes (If No - schedule appointment) (If Yes - send message)  Appointment offered? Yes  Type of order/referral and detailed reason for visit: Rheumatologist  Preference of office, provider, location: somewhere close   If referral order, have you been seen by this specialty before? No (If Yes, this issue or another issue? When? Where?  Can we respond through MyChart? No

## 2023-11-12 NOTE — Telephone Encounter (Signed)
 Referral has been sent.

## 2023-11-12 NOTE — Telephone Encounter (Signed)
 Left message to return call to our office.  Referral has been sent for Rheumatologist.

## 2023-11-12 NOTE — Telephone Encounter (Signed)
-----   Message from Valli Gaw sent at 11/12/2023 10:48 AM EDT ----- Anti Thyroglobulin antibodies elevated.  Does she want an Endocrinology referral?

## 2023-11-12 NOTE — Therapy (Signed)
 OUTPATIENT PHYSICAL THERAPY LOWER EXTREMITY TREATMENT     Patient Name: Taylor Mccarthy MRN: [id number] DOB:12/22/1940, 83 y.o., female Today's Date: 11/12/23   END OF SESSION:   PT End of Session - 11/12/23 1718       Visit Number 7    Number of Visits 17     Date for PT Re-Evaluation 12/10/23     Authorization Type 2x/week x 8 weeks (recert needed 11/07/79, PN at 10th visit)     PT Start Time 1415    PT Stop Time 1500    PT Time Calculation (min) 45 min     Activity Tolerance Patient tolerated treatment well     Behavior During Therapy [facility] for tasks assessed/performed                       Past Medical History:  Diagnosis Date   Atrial fibrillation, persistent (HCC)     Cancer (HCC)      colon cancer 1999 s/p resection    History of colon polyps     Insomnia     Low back pain     Mitral regurgitation     Pap smear abnormality of cervix with ASCUS favoring benign     Permanent atrial fibrillation (HCC) 08/06/2022   Sciatica     Vitamin D deficiency               Past Surgical History:  Procedure Laterality Date   COLON RESECTION        1999 for cancer    TONSILLECTOMY       WISDOM TOOTH EXTRACTION                Patient Active Problem List    Diagnosis Date Noted   Arthritis 09/10/2023   Polyarthralgia 09/10/2023   OAB (overactive bladder) 09/10/2023   Muscle spasm 03/11/2023   PVD (peripheral vascular disease) (HCC) 10/20/2022   History of pelvic fracture 10/20/2022   Anemia 10/20/2022   Acute dermatitis 10/16/2022   Atrophic vaginitis 10/16/2022   Candidiasis of vulva 10/16/2022   Fatigue 10/16/2022   Hip pain 10/16/2022   Leukorrhea 10/16/2022   Low back pain 10/16/2022   Motion sickness 10/16/2022   Neck sprain 10/16/2022   Osteoarthritis of hip 10/16/2022   Pain in limb 10/16/2022   Sciatica 10/16/2022   Lipid screening 08/06/2022   Chronic heart failure with preserved ejection fraction (HCC) 08/06/2022   Sacral fracture, closed (HCC)      Rhabdomyolysis 11/13/2021   Pelvic fracture (HCC) 11/12/2021   Overactive bladder 06/27/2021   Osteopenia 05/25/2021   Abnormal thyroid blood test 09/11/2019   Macrocytosis 09/11/2019   Mood disorder (HCC) 05/07/2019   Abnormal gait 05/07/2019   Urinary incontinence 05/07/2019   Longstanding persistent atrial fibrillation (HCC) 05/28/2018   History of colon cancer 05/28/2018   Vertigo 05/28/2018   DDD (degenerative disc disease), lumbar 05/28/2018   Hypothyroidism 05/28/2018   Cough 05/28/2018   Vitamin D deficiency 05/28/2018   Insomnia 05/28/2018   Lesion of oral mucosa 05/28/2018   Cardiac arrhythmia 05/21/2018   Allergies 05/21/2018   Non-seasonal allergic rhinitis 05/08/2018   Unsteadiness 05/08/2018      PCP: Dr. Clent Ridges, MD   REFERRING PROVIDER: same   REFERRING DIAG: M25.50 (ICD-10-CM) - Polyarthralgia    THERAPY DIAG:  Polyarthralgia   Hip weakness   Muscle weakness (generalized)   Rationale for Evaluation and Treatment: Rehabilitation   ONSET DATE: 2023- pelvic fx  SUBJECTIVE:    SUBJECTIVE STATEMENT: Pt has a few concerns today: she reports pain/achy sensation in pelvis/hips and like her strength is limited after her pelvic fx last year; also having some dull/achy pain in wrist/hands and arms which she attributes to arthritis.  Planning to focus on lower body concerns today.   She feels like her legs are weak and her lower back is weak too.  She was very active prior to the fall.   She noticed sx ~3 months ago, but overall has had a challenging year including moving into a new home and navigating the unexpected loss of a close family member (daughter).   Feels like her walking is impaired.  Using a SPC for amb.   Fear of falling even though she has not fallen since the injury that resulted in pelvic fx last year.   She has been using the [facility] since the initial injury.  She mainly uses it out of the home.  She has a rollator at home she uses.   She  does report feeling unsteady/dizzy/vertigo feelings which began after her fall.  Has not been evaluated for this yet.     PERTINENT HISTORY: Larey Seat and fx pelvis- no surgery; did a course of inpatient rehab and PT after that PAIN:  Are you having pain? Yes 4/10   PRECAUTIONS: Fall   RED FLAGS: None      WEIGHT BEARING RESTRICTIONS: No   FALLS:  Has patient fallen in last 6 months? No; has had a few "wobbles"   LIVING ENVIRONMENT: Lives with: alone, she moved into her daughter's home Lives in: House/apartment Stairs: No no stairs; 1 step up but that's it Has following equipment at home: Single point cane; able to drive and takes care of her housework   OCCUPATION: retired; Conservation officer, historic buildings Programme researcher, broadcasting/film/video), worked in Engineer, production, Social research officer, government, painting    PLOF: Independent   PATIENT GOALS: strengthen her lower back and legs    NEXT MD VISIT: none scheduled    OBJECTIVE:  Note: Objective measures were completed at Evaluation unless otherwise noted.   DIAGNOSTIC FINDINGS: none recently, per chart review- April 2023 x-ray report pelvis/sacrum/coccyx   IMPRESSION: Acute displaced fractures of the right and left superior and inferior pubic rami.   IMPRESSION: Displaced fracture is seen in the right iliac bone in the medial margin of right acetabulum. Comminuted fractures are seen in the left superior pubic ramus and right inferior pubic ramus. These fractures are better evaluated in the radiographs of hip done today.   PATIENT SURVEYS:  LEFS 42/80   COGNITION: Overall cognitive status: Within functional limits for tasks assessed                         SENSATION: WFL   POSTURE: pt amb with SPC in R UE today, decreased gait speed and slight trunk flexion/hip flexion   PALPATION: Specific palpation deferred   LOWER EXTREMITY ROM:   Active ROM Right eval Left eval  Hip flexion Min limited compared to L normal  Hip extension      Hip abduction       Hip adduction      Hip internal rotation Min limited compared to L normal  Hip external rotation      Knee flexion normal normal  Knee extension normal normal  Ankle dorsiflexion      Ankle plantarflexion      Ankle inversion      Ankle eversion       (  Blank rows = not tested)   LOWER EXTREMITY MMT:   MMT Right eval Left eval  Hip flexion 3+ 5  Hip extension 3+ 4+  Hip abduction 3+ 4+  Hip adduction      Hip internal rotation 3+ 4  Hip external rotation 4 4  Knee flexion 5 5  Knee extension 4 5  Ankle dorsiflexion      Ankle plantarflexion      Ankle inversion      Ankle eversion       (Blank rows = not tested)   LOWER EXTREMITY SPECIAL TESTS:  (-) seated slump test   FUNCTIONAL TESTS:  5 times sit to stand: pt compensates with leaning femur on edge of plinth instead of using LE muscles; cues for "stand tall" used;  3 attempts of 5X STS performed today; best time was 15 seconds, but pt unable to perform without use of UE or LE substitutions   GAIT: Distance walked: into clinic with SPC in R UE and SPC appears to be slightly taller than ideal height, will adjust as needed at next visit                                                                                                            TREATMENT DATE: 11/12/23 Subjective: Pt feels like had soreness in her hip mm this afternoon; she used a heating pad and this helped.  Thinks it was a muscle spasm.  She has been working on her HEP.    0/10 pain  Objective:  Therapeutic exercise: Sit to stand: practiced from standard chair: x5; 4 sets PT verbal/visual/tactile cues for using hip hinge; scooting hips forward and bending knees slightly for optimal body mechanics Mini squat: 2x10 Seated marches: 2x10 3# Seated hip abd: with PT manual resistance x15 (blueband 2x10) Supine hip add: 5 second holds with PT manual resistance x15 Bridges: 2x10 (PT stabilized feet) SLR x20 ea- not today Standing hip abd: 3# 2x10  ea Standing hip extension: 3# 2x10 ea Standing hamstring curl: 3# 2x10 ea- not today PROM hip ER, flexion, hamstring stretch (pt reports "cramping" during exercises) throughout session: 4 min- not today    Neuro re-ed:  Abdominal bracing with diaphragmatic breathing: exhale + brace x 15, will benefit from more practice, had difficulty with coordination today SLS 3x ea side x 10-15 seconds Tandem stance 3x 10-15 seconds ea side Feet together on airex x 20 seconds, 5x Tandem walk line x 3 laps  Adjusted cane height (lowered 2 notches today)  Therapeutic activities: not today Forward hurdles: 5 laps, practiced with single UE support Stair navigation: 3 laps (4 steps) with b/l railing Front step ups: 12x ea side Heel raises: 2x20- focus on posterior chain mm activation during amb- pushoff phase of gait  HEP instruction    PATIENT EDUCATION:  Education details: PT POC/goals, purpose and benefit of LE strengthening to improve balance and gait; discussed using SPC out of home and rollator in home; discussed exercise goals, HEP, instructions for exercises within PT session  Person educated: Patient Education method: Explanation and Verbal cues Education comprehension: verbalized understanding and needs further education   HOME EXERCISE PROGRAM: Practice using SPC on L UE  Access Code: [email or web address] URL: [email or web address] Date: 10/17/2023 Prepared by: Max Fickle  Exercises - Supine Bridge  - 1 x daily - 7 x weekly - 2 sets - 10 reps - 3 hold - Seated Hip Abduction with Resistance  - 1 x daily - 7 x weekly - 2 sets - 10 reps - Sit to Stand  - 1 x daily - 7 x weekly - 3 sets - 5 reps - Seated March  - 1 x daily - 7 x weekly - 2 sets - 10 reps   ASSESSMENT:   CLINICAL IMPRESSION: Patient was challenged with LE strengthening appropriately; she expresses apprehension during balance exercises associated with a fear of falling.  Would benefit form continuing to  incorporate balance exercises into program to improve confidence/reduce fear of falling and reduce fall risk.  Adjusted her SPC height today, she brought a different one with her today; she continues to prefer using it in R hand even though R LE is the weaker leg.  Pt lacks full posterior chain (gluteals, gastroc mm) activation during terminal stance/push off phase of gait resulting in short stride/wide base of support.  Will continue working on this.  Overall she should continue to benefit from skilled PT treatment to address LE weakness/difficulty walking/decreased activity tolerance/decreased balance/fear of falling/gait abnormalities after sustaining a pelvic fx in 2023.   OBJECTIVE IMPAIRMENTS: Abnormal gait, decreased balance, decreased mobility, difficulty walking, decreased strength, postural dysfunction, and pain.    ACTIVITY LIMITATIONS: carrying, lifting, bending, squatting, stairs, and locomotion level   PARTICIPATION LIMITATIONS: meal prep, cleaning, shopping, community activity, and yard work   PERSONAL FACTORS: Age and Time since onset of injury/illness/exacerbation are also affecting patient's functional outcome.    REHAB POTENTIAL: Good   CLINICAL DECISION MAKING: Stable/uncomplicated   EVALUATION COMPLEXITY: Low     GOALS: Goals reviewed with patient? Yes   SHORT TERM GOALS: Target date: 10/30/23 Pt will be able to perform STS without use of LE or UE compensatory patterns from elevated chair height 24 inches x5 to facilitate improved LE strength, balance, and walking endurance Baseline: unable Goal status: INITIAL       LONG TERM GOALS: Target date: 12/10/23   Improve LEFS >18 indicating pt able to perform her daily activities at home without being limited by LE weakness/pain Baseline: 42/80 (MCID is 9) Goal status: INITIAL   2.  Improve LE strength 1/2 MMT grade to promote pt being able to perform 30 min heavy housework, squat, get in and out of her car without being  limited by LE weakness Baseline:  Goal status: INITIAL   3.  Pt will amb x 10 minutes and no loss of balance with least restrictive AD to amb into out of stores and around her home for housework without being limited by LE pain and weakness  Baseline:  Goal status: INITIAL     PLAN:   PT FREQUENCY: 2x/week   PT DURATION: 8 weeks   PLANNED INTERVENTIONS: 97110-Therapeutic exercises, 97530- Therapeutic activity, [id number]- Neuromuscular re-education, 97535- Self Care, and 54098- Manual therapy   PLAN FOR NEXT SESSION: strengthening, balance, gait training   Max Fickle, PT, DPT, OCS   Ardine Bjork, PT 11/12/2023, 5:20 PM

## 2023-11-13 NOTE — Telephone Encounter (Signed)
 Patient called back and stated her referral needs to be sent to [facility], tele: [phone], because the place her referral was sent to does not have any appts available anytime soon. Please advise.

## 2023-11-13 NOTE — Telephone Encounter (Signed)
 Called pt and It sounded like someone picked up but never said anything. Calling to let pt know a referral has been placed, but was wanting to know if she would also like a endocrinology referral as well?

## 2023-11-13 NOTE — Telephone Encounter (Signed)
 Called pt and she does not want a referral to endo right now. I did give her results and she said that she wants to do 1 thing at a time.

## 2023-11-14 ENCOUNTER — Ambulatory Visit

## 2023-11-14 ENCOUNTER — Telehealth: Payer: Self-pay | Admitting: Family Medicine

## 2023-11-14 ENCOUNTER — Telehealth: Payer: Self-pay

## 2023-11-14 DIAGNOSIS — M6281 Muscle weakness (generalized): Secondary | ICD-10-CM

## 2023-11-14 DIAGNOSIS — R29898 Other symptoms and signs involving the musculoskeletal system: Secondary | ICD-10-CM

## 2023-11-14 DIAGNOSIS — M2550 Pain in unspecified joint: Secondary | ICD-10-CM

## 2023-11-14 NOTE — Telephone Encounter (Signed)
 Called pt and advised her that referral has been faxed.

## 2023-11-14 NOTE — Telephone Encounter (Signed)
 Copied from CRM [id number]. Topic: Referral - Question >> Nov 14, 2023  1:56 PM Taylor Mccarthy wrote: Reason for CRM: patient wants to know if a referral to the rheumatologist was arranged for her to [facility]. She is asking that someone from the office give her a call to let her know please (she originally asked for [name] but understands if she is busy so is just asking for someone to notify her please). Patient stated that the office for the rheumatologist is waiting on the referral so they can get her scheduled for an appointment. Patient would like a call back regarding the status.

## 2023-11-14 NOTE — Telephone Encounter (Signed)
 Noted.

## 2023-11-14 NOTE — Telephone Encounter (Signed)
 Copied from CRM [id number]. Topic: Referral - Question >> Nov 14, 2023 11:47 AM Lovett Ruck C wrote: Reason for CRM: patient wants to know if a referral to the rheumatologist was arranged for her to [facility]. She is asking that someone from the office give her a call to let her know please (she originally asked for [name] but understands if she is busy so is just asking for someone to notify her please)

## 2023-11-14 NOTE — Therapy (Signed)
 OUTPATIENT PHYSICAL THERAPY LOWER EXTREMITY TREATMENT     Patient Name: Taylor Mccarthy MRN: [id number] DOB:12/27/1940, 83 y.o., female Today's Date: 11/14/23   END OF SESSION:   PT End of Session - 11/14/23 1718       Visit Number 8    Number of Visits 17     Date for PT Re-Evaluation 12/10/23     Authorization Type 2x/week x 8 weeks (recert needed 11/07/79, PN at 10th visit)     PT Start Time 1415    PT Stop Time 1500    PT Time Calculation (min) 45 min     Activity Tolerance Patient tolerated treatment well     Behavior During Therapy [facility] for tasks assessed/performed                       Past Medical History:  Diagnosis Date   Atrial fibrillation, persistent (HCC)     Cancer (HCC)      colon cancer 1999 s/p resection    History of colon polyps     Insomnia     Low back pain     Mitral regurgitation     Pap smear abnormality of cervix with ASCUS favoring benign     Permanent atrial fibrillation (HCC) 08/06/2022   Sciatica     Vitamin D deficiency               Past Surgical History:  Procedure Laterality Date   COLON RESECTION        1999 for cancer    TONSILLECTOMY       WISDOM TOOTH EXTRACTION                Patient Active Problem List    Diagnosis Date Noted   Arthritis 09/10/2023   Polyarthralgia 09/10/2023   OAB (overactive bladder) 09/10/2023   Muscle spasm 03/11/2023   PVD (peripheral vascular disease) (HCC) 10/20/2022   History of pelvic fracture 10/20/2022   Anemia 10/20/2022   Acute dermatitis 10/16/2022   Atrophic vaginitis 10/16/2022   Candidiasis of vulva 10/16/2022   Fatigue 10/16/2022   Hip pain 10/16/2022   Leukorrhea 10/16/2022   Low back pain 10/16/2022   Motion sickness 10/16/2022   Neck sprain 10/16/2022   Osteoarthritis of hip 10/16/2022   Pain in limb 10/16/2022   Sciatica 10/16/2022   Lipid screening 08/06/2022   Chronic heart failure with preserved ejection fraction (HCC) 08/06/2022   Sacral fracture, closed (HCC)      Rhabdomyolysis 11/13/2021   Pelvic fracture (HCC) 11/12/2021   Overactive bladder 06/27/2021   Osteopenia 05/25/2021   Abnormal thyroid blood test 09/11/2019   Macrocytosis 09/11/2019   Mood disorder (HCC) 05/07/2019   Abnormal gait 05/07/2019   Urinary incontinence 05/07/2019   Longstanding persistent atrial fibrillation (HCC) 05/28/2018   History of colon cancer 05/28/2018   Vertigo 05/28/2018   DDD (degenerative disc disease), lumbar 05/28/2018   Hypothyroidism 05/28/2018   Cough 05/28/2018   Vitamin D deficiency 05/28/2018   Insomnia 05/28/2018   Lesion of oral mucosa 05/28/2018   Cardiac arrhythmia 05/21/2018   Allergies 05/21/2018   Non-seasonal allergic rhinitis 05/08/2018   Unsteadiness 05/08/2018      PCP: Dr. Clent Ridges, MD   REFERRING PROVIDER: same   REFERRING DIAG: M25.50 (ICD-10-CM) - Polyarthralgia    THERAPY DIAG:  Polyarthralgia   Hip weakness   Muscle weakness (generalized)   Rationale for Evaluation and Treatment: Rehabilitation   ONSET DATE: 2023- pelvic fx  SUBJECTIVE:    SUBJECTIVE STATEMENT: Pt has a few concerns today: she reports pain/achy sensation in pelvis/hips and like her strength is limited after her pelvic fx last year; also having some dull/achy pain in wrist/hands and arms which she attributes to arthritis.  Planning to focus on lower body concerns today.   She feels like her legs are weak and her lower back is weak too.  She was very active prior to the fall.   She noticed sx ~3 months ago, but overall has had a challenging year including moving into a new home and navigating the unexpected loss of a close family member (daughter).   Feels like her walking is impaired.  Using a SPC for amb.   Fear of falling even though she has not fallen since the injury that resulted in pelvic fx last year.   She has been using the [facility] since the initial injury.  She mainly uses it out of the home.  She has a rollator at home she uses.   She  does report feeling unsteady/dizzy/vertigo feelings which began after her fall.  Has not been evaluated for this yet.     PERTINENT HISTORY: Larey Seat and fx pelvis- no surgery; did a course of inpatient rehab and PT after that PAIN:  Are you having pain? Yes 4/10   PRECAUTIONS: Fall   RED FLAGS: None      WEIGHT BEARING RESTRICTIONS: No   FALLS:  Has patient fallen in last 6 months? No; has had a few "wobbles"   LIVING ENVIRONMENT: Lives with: alone, she moved into her daughter's home Lives in: House/apartment Stairs: No no stairs; 1 step up but that's it Has following equipment at home: Single point cane; able to drive and takes care of her housework   OCCUPATION: retired; Conservation officer, historic buildings Programme researcher, broadcasting/film/video), worked in Engineer, production, Social research officer, government, painting    PLOF: Independent   PATIENT GOALS: strengthen her lower back and legs    NEXT MD VISIT: none scheduled    OBJECTIVE:  Note: Objective measures were completed at Evaluation unless otherwise noted.   DIAGNOSTIC FINDINGS: none recently, per chart review- April 2023 x-ray report pelvis/sacrum/coccyx   IMPRESSION: Acute displaced fractures of the right and left superior and inferior pubic rami.   IMPRESSION: Displaced fracture is seen in the right iliac bone in the medial margin of right acetabulum. Comminuted fractures are seen in the left superior pubic ramus and right inferior pubic ramus. These fractures are better evaluated in the radiographs of hip done today.   PATIENT SURVEYS:  LEFS 42/80   COGNITION: Overall cognitive status: Within functional limits for tasks assessed                         SENSATION: WFL   POSTURE: pt amb with SPC in R UE today, decreased gait speed and slight trunk flexion/hip flexion   PALPATION: Specific palpation deferred   LOWER EXTREMITY ROM:   Active ROM Right eval Left eval  Hip flexion Min limited compared to L normal  Hip extension      Hip abduction       Hip adduction      Hip internal rotation Min limited compared to L normal  Hip external rotation      Knee flexion normal normal  Knee extension normal normal  Ankle dorsiflexion      Ankle plantarflexion      Ankle inversion      Ankle eversion       (  Blank rows = not tested)   LOWER EXTREMITY MMT:   MMT Right eval Left eval  Hip flexion 3+ 5  Hip extension 3+ 4+  Hip abduction 3+ 4+  Hip adduction      Hip internal rotation 3+ 4  Hip external rotation 4 4  Knee flexion 5 5  Knee extension 4 5  Ankle dorsiflexion      Ankle plantarflexion      Ankle inversion      Ankle eversion       (Blank rows = not tested)   LOWER EXTREMITY SPECIAL TESTS:  (-) seated slump test   FUNCTIONAL TESTS:  5 times sit to stand: pt compensates with leaning femur on edge of plinth instead of using LE muscles; cues for "stand tall" used;  3 attempts of 5X STS performed today; best time was 15 seconds, but pt unable to perform without use of UE or LE substitutions   GAIT: Distance walked: into clinic with SPC in R UE and SPC appears to be slightly taller than ideal height, will adjust as needed at next visit                                                                                                            TREATMENT DATE: 11/14/23 Subjective: Pt feels generalized soreness upon arrival, almost wanted to cancel coming today.  Had difficulty getting up from floor when she got down to do some cat care.  She is dealing with stressful personal circumstances.  Trying to get scheduled to see rheumatology.  Feels "light headed"/dizzy.  0/10 pain  Objective: HR: 70 bpm BP: `126/61 Sp O2 96%  Therapeutic exercise: Sit to stand: practiced from standard chair: x5; 4 sets PT verbal/visual/tactile cues for using hip hinge; scooting hips forward and bending knees slightly for optimal body mechanics Mini squat: 2x10 Seated marches: 2x10 3# Seated hip abd: with PT manual resistance x15 (blueband  2x10) Supine hip add: 5 second holds with PT manual resistance x15 Bridges: 2x10 (PT stabilized feet) SLR x20 ea- not today Standing hip abd: 3# 2x10 ea Standing hip extension: 3# 2x10 ea Standing hamstring curl: 3# 2x10 ea- not today Seated knee extension: 5# 10x ea, 2 sets Front step ups: 12x ea side PROM hip ER, flexion, hamstring stretch (pt reports "cramping" during exercises) throughout session: 4 min- not today  Hurdles: practicing for hip flexion active ROM and strength, 3 laps- forward with single UE support, 3 laps lateral direction with single UE support  Amb in hallway x 4 laps with PT supervision, emphasizing symmetrical step length, upright posture, and overall endurance    Neuro re-ed:  Abdominal bracing with diaphragmatic breathing: exhale + brace x 15, will benefit from more practice, had difficulty with coordination today SLS 3x ea side x 10-15 seconds Tandem stance 3x 10-15 seconds ea side Feet together on airex x 20 seconds, 5x Feet together on airex with head up/down x10 ea- pt reports apprehension/fear during this because of difficulty Tandem walk line x 3 laps Sitting on  blue physioball with alternating marches: x20- light hold on table for support; PT guarding from behind too for safety  Adjusted cane height (lowered 2 notches today)  Therapeutic activities: not today Forward hurdles: 5 laps, practiced with single UE support Stair navigation: 3 laps (4 steps) with b/l railing Heel raises: 2x20- focus on posterior chain mm activation during amb- pushoff phase of gait  HEP instruction    PATIENT EDUCATION:  Education details: PT POC/goals, purpose and benefit of LE strengthening to improve balance and gait; discussed using SPC out of home and rollator in home; discussed exercise goals, HEP, instructions for exercises within PT session   Person educated: Patient Education method: Explanation and Verbal cues Education comprehension: verbalized understanding  and needs further education   HOME EXERCISE PROGRAM: Practice using SPC on L UE  Access Code: [email or web address] URL: [email or web address] Date: 10/17/2023 Prepared by: Lucrecia Sables  Exercises - Supine Bridge  - 1 x daily - 7 x weekly - 2 sets - 10 reps - 3 hold - Seated Hip Abduction with Resistance  - 1 x daily - 7 x weekly - 2 sets - 10 reps - Sit to Stand  - 1 x daily - 7 x weekly - 3 sets - 5 reps - Seated March  - 1 x daily - 7 x weekly - 2 sets - 10 reps   ASSESSMENT:   CLINICAL IMPRESSION: Patient's vital signs in safe range for exercise upon arrival.  She continues to express fear/apprehension during balance exercises.  Will continue incorporating balance exercises into her PT POC.  Able ot progress LE strengthening with increased resistance.  Pt lacks full posterior chain (gluteals, gastroc mm) activation during terminal stance/push off phase of gait resulting in short stride/wide base of support.  Will continue working on this.  Improving strength will also help with transfers up and down from floor which she had difficulty with functionally at home this afternoon.  Overall she should continue to benefit from skilled PT treatment to address LE weakness/difficulty walking/decreased activity tolerance/decreased balance/fear of falling/gait abnormalities after sustaining a pelvic fx in 2023.   OBJECTIVE IMPAIRMENTS: Abnormal gait, decreased balance, decreased mobility, difficulty walking, decreased strength, postural dysfunction, and pain.    ACTIVITY LIMITATIONS: carrying, lifting, bending, squatting, stairs, and locomotion level   PARTICIPATION LIMITATIONS: meal prep, cleaning, shopping, community activity, and yard work   PERSONAL FACTORS: Age and Time since onset of injury/illness/exacerbation are also affecting patient's functional outcome.    REHAB POTENTIAL: Good   CLINICAL DECISION MAKING: Stable/uncomplicated   EVALUATION COMPLEXITY: Low     GOALS: Goals  reviewed with patient? Yes   SHORT TERM GOALS: Target date: 10/30/23 Pt will be able to perform STS without use of LE or UE compensatory patterns from elevated chair height 24 inches x5 to facilitate improved LE strength, balance, and walking endurance Baseline: unable Goal status: INITIAL       LONG TERM GOALS: Target date: 12/10/23   Improve LEFS >18 indicating pt able to perform her daily activities at home without being limited by LE weakness/pain Baseline: 42/80 (MCID is 9) Goal status: INITIAL   2.  Improve LE strength 1/2 MMT grade to promote pt being able to perform 30 min heavy housework, squat, get in and out of her car without being limited by LE weakness Baseline:  Goal status: INITIAL   3.  Pt will amb x 10 minutes and no loss of balance with least restrictive AD to amb into out of stores and  around her home for housework without being limited by LE pain and weakness  Baseline:  Goal status: INITIAL     PLAN:   PT FREQUENCY: 2x/week   PT DURATION: 8 weeks   PLANNED INTERVENTIONS: [id number]-Therapeutic exercises, 97530- Therapeutic activity, [id number]- Neuromuscular re-education, 97535- Self Care, and [id number]- Manual therapy   PLAN FOR NEXT SESSION: strengthening, balance, gait training   Lucrecia Sables, PT, DPT, OCS   Kin Penner, PT 11/14/2023, 5:20 PM

## 2023-11-19 ENCOUNTER — Ambulatory Visit

## 2023-11-21 ENCOUNTER — Ambulatory Visit

## 2023-11-21 DIAGNOSIS — M2550 Pain in unspecified joint: Secondary | ICD-10-CM

## 2023-11-21 DIAGNOSIS — R29898 Other symptoms and signs involving the musculoskeletal system: Secondary | ICD-10-CM

## 2023-11-21 DIAGNOSIS — M6281 Muscle weakness (generalized): Secondary | ICD-10-CM

## 2023-11-21 NOTE — Therapy (Signed)
 OUTPATIENT PHYSICAL THERAPY LOWER EXTREMITY TREATMENT     Patient Name: Taylor Mccarthy MRN: [id number] DOB:06-Feb-1941, 83 y.o., female Today's Date: 11/21/23   END OF SESSION:   PT End of Session - 11/21/23 1718       Visit Number 9    Number of Visits 17     Date for PT Re-Evaluation 12/10/23     Authorization Type 2x/week x 8 weeks (recert needed 11/07/79, PN at 10th visit)     PT Start Time 1415    PT Stop Time 1500    PT Time Calculation (min) 45 min     Activity Tolerance Patient tolerated treatment well     Behavior During Therapy [facility] for tasks assessed/performed                       Past Medical History:  Diagnosis Date   Atrial fibrillation, persistent (HCC)     Cancer (HCC)      colon cancer 1999 s/p resection    History of colon polyps     Insomnia     Low back pain     Mitral regurgitation     Pap smear abnormality of cervix with ASCUS favoring benign     Permanent atrial fibrillation (HCC) 08/06/2022   Sciatica     Vitamin D  deficiency               Past Surgical History:  Procedure Laterality Date   COLON RESECTION        1999 for cancer    TONSILLECTOMY       WISDOM TOOTH EXTRACTION                Patient Active Problem List    Diagnosis Date Noted   Arthritis 09/10/2023   Polyarthralgia 09/10/2023   OAB (overactive bladder) 09/10/2023   Muscle spasm 03/11/2023   PVD (peripheral vascular disease) (HCC) 10/20/2022   History of pelvic fracture 10/20/2022   Anemia 10/20/2022   Acute dermatitis 10/16/2022   Atrophic vaginitis 10/16/2022   Candidiasis of vulva 10/16/2022   Fatigue 10/16/2022   Hip pain 10/16/2022   Leukorrhea 10/16/2022   Low back pain 10/16/2022   Motion sickness 10/16/2022   Neck sprain 10/16/2022   Osteoarthritis of hip 10/16/2022   Pain in limb 10/16/2022   Sciatica 10/16/2022   Lipid screening 08/06/2022   Chronic heart failure with preserved ejection fraction (HCC) 08/06/2022   Sacral fracture, closed (HCC)      Rhabdomyolysis 11/13/2021   Pelvic fracture (HCC) 11/12/2021   Overactive bladder 06/27/2021   Osteopenia 05/25/2021   Abnormal thyroid  blood test 09/11/2019   Macrocytosis 09/11/2019   Mood disorder (HCC) 05/07/2019   Abnormal gait 05/07/2019   Urinary incontinence 05/07/2019   Longstanding persistent atrial fibrillation (HCC) 05/28/2018   History of colon cancer 05/28/2018   Vertigo 05/28/2018   DDD (degenerative disc disease), lumbar 05/28/2018   Hypothyroidism 05/28/2018   Cough 05/28/2018   Vitamin D  deficiency 05/28/2018   Insomnia 05/28/2018   Lesion of oral mucosa 05/28/2018   Cardiac arrhythmia 05/21/2018   Allergies 05/21/2018   Non-seasonal allergic rhinitis 05/08/2018   Unsteadiness 05/08/2018      PCP: Dr. Sueanne Emerald, MD   REFERRING PROVIDER: same   REFERRING DIAG: M25.50 (ICD-10-CM) - Polyarthralgia    THERAPY DIAG:  Polyarthralgia   Hip weakness   Muscle weakness (generalized)   Rationale for Evaluation and Treatment: Rehabilitation   ONSET DATE: 2023- pelvic fx  SUBJECTIVE:    SUBJECTIVE STATEMENT: Pt has a few concerns today: she reports pain/achy sensation in pelvis/hips and like her strength is limited after her pelvic fx last year; also having some dull/achy pain in wrist/hands and arms which she attributes to arthritis.  Planning to focus on lower body concerns today.   She feels like her legs are weak and her lower back is weak too.  She was very active prior to the fall.   She noticed sx ~3 months ago, but overall has had a challenging year including moving into a new home and navigating the unexpected loss of a close family member (daughter).   Feels like her walking is impaired.  Using a SPC for amb.   Fear of falling even though she has not fallen since the injury that resulted in pelvic fx last year.   She has been using the [facility] since the initial injury.  She mainly uses it out of the home.  She has a rollator at home she uses.   She  does report feeling unsteady/dizzy/vertigo feelings which began after her fall.  Has not been evaluated for this yet.     PERTINENT HISTORY: [name] and fx pelvis- no surgery; did a course of inpatient rehab and PT after that PAIN:  Are you having pain? Yes 4/10   PRECAUTIONS: Fall   RED FLAGS: None      WEIGHT BEARING RESTRICTIONS: No   FALLS:  Has patient fallen in last 6 months? No; has had a few "wobbles"   LIVING ENVIRONMENT: Lives with: alone, she moved into her daughter's home Lives in: House/apartment Stairs: No no stairs; 1 step up but that's it Has following equipment at home: Single point cane; able to drive and takes care of her housework   OCCUPATION: retired; Conservation officer, historic buildings Programme researcher, broadcasting/film/video), worked in Engineer, production, Social research officer, government, painting    PLOF: Independent   PATIENT GOALS: strengthen her lower back and legs    NEXT MD VISIT: none scheduled    OBJECTIVE:  Note: Objective measures were completed at Evaluation unless otherwise noted.   DIAGNOSTIC FINDINGS: none recently, per chart review- April 2023 x-ray report pelvis/sacrum/coccyx   IMPRESSION: Acute displaced fractures of the right and left superior and inferior pubic rami.   IMPRESSION: Displaced fracture is seen in the right iliac bone in the medial margin of right acetabulum. Comminuted fractures are seen in the left superior pubic ramus and right inferior pubic ramus. These fractures are better evaluated in the radiographs of hip done today.   PATIENT SURVEYS:  LEFS 42/80   COGNITION: Overall cognitive status: Within functional limits for tasks assessed                         SENSATION: WFL   POSTURE: pt amb with SPC in R UE today, decreased gait speed and slight trunk flexion/hip flexion   PALPATION: Specific palpation deferred   LOWER EXTREMITY ROM:   Active ROM Right eval Left eval  Hip flexion Min limited compared to L normal  Hip extension      Hip abduction       Hip adduction      Hip internal rotation Min limited compared to L normal  Hip external rotation      Knee flexion normal normal  Knee extension normal normal  Ankle dorsiflexion      Ankle plantarflexion      Ankle inversion      Ankle eversion       (  Blank rows = not tested)   LOWER EXTREMITY MMT:   MMT Right eval Left eval  Hip flexion 3+ 5  Hip extension 3+ 4+  Hip abduction 3+ 4+  Hip adduction      Hip internal rotation 3+ 4  Hip external rotation 4 4  Knee flexion 5 5  Knee extension 4 5  Ankle dorsiflexion      Ankle plantarflexion      Ankle inversion      Ankle eversion       (Blank rows = not tested)   LOWER EXTREMITY SPECIAL TESTS:  (-) seated slump test   FUNCTIONAL TESTS:  5 times sit to stand: pt compensates with leaning femur on edge of plinth instead of using LE muscles; cues for "stand tall" used;  3 attempts of 5X STS performed today; best time was 15 seconds, but pt unable to perform without use of UE or LE substitutions   GAIT: Distance walked: into clinic with SPC in R UE and SPC appears to be slightly taller than ideal height, will adjust as needed at next visit                                                                                                            TREATMENT DATE: 11/21/23 Subjective: Pt able to get an appointment scheduled at a rheumatologist office.  She has not had any falls, but has been feeling a little "light headed"/dizzy.  0/10 pain  Objective: HR: 70 bpm BP upon arrival sitting with arm elevated: 88/46 and 104/43 Pt had 8 oz water during session; focused on seated therapeutic exercises BP after sitting exercises: sitting with arm elevated 111/49   Therapeutic exercise: Sit to stand: practiced from standard chair: x5; 3 sets PT verbal/visual/tactile cues for using hip hinge; scooting hips forward and bending knees slightly for optimal body mechanics Mini squat: 2x10 Seated marches: 2x10 3# Seated hip abd: with  PT manual resistance x15 (blueband 2x10) Supine hip add: 5 second holds with PT manual resistance x15 Bridges: 2x10 (PT stabilized feet) SLR x20 ea- not today Standing hip abd: 3# 2x10 ea Standing hip extension: 3# 2x10 ea Standing hamstring curl: 3# 2x10 ea- not today Seated knee extension: 5# 10x ea, 2 sets Front step ups: 12x ea side PROM hip ER, flexion, hamstring stretch (pt reports "cramping" during exercises) throughout session: 4 min- not today Hip add with ball: 15x Hip abd with band: 15x blue TB  Hurdles: practicing for hip flexion active ROM and strength, 3 laps- forward with single UE support, 3 laps lateral direction with single UE support- not today  Amb in hallway x 4 laps with PT supervision, emphasizing symmetrical step length, upright posture, and overall endurance- not today   Neuro re-ed:  Abdominal bracing with diaphragmatic breathing: exhale + brace x 15, will benefit from more practice, had difficulty with coordination today Standing marches x15 ea without UE support SLS 5x ea LE, 5 seconds  Tandem stance 3x 10-15 seconds ea side Feet together on airex x 20  seconds, 5x- not today Feet together on airex with head up/down x10 ea- pt reports apprehension/fear during this because of difficulty Tandem walk line x 3 laps Sitting on blue physioball with alternating marches: x20- light hold on table for support; PT guarding from behind too for safety- not today Amb without SPC, (gait belt, PT close supervision) x1 min intervals, 4 min around clinic  Therapeutic activities: not today Forward hurdles: 5 laps, practiced with single UE support Stair navigation: 3 laps (4 steps) with b/l railing Heel raises: 2x20- focus on posterior chain mm activation during amb- pushoff phase of gait  HEP instruction    PATIENT EDUCATION:  Education details: PT POC/goals, purpose and benefit of LE strengthening to improve balance and gait; discussed using SPC out of home and rollator  in home; discussed exercise goals, HEP, instructions for exercises within PT session   Person educated: Patient Education method: Explanation and Verbal cues Education comprehension: verbalized understanding and needs further education   HOME EXERCISE PROGRAM: Practice using SPC on L UE  Access Code: [email or web address] URL: [email or web address] Date: 10/17/2023 Prepared by: Lucrecia Sables  Exercises - Supine Bridge  - 1 x daily - 7 x weekly - 2 sets - 10 reps - 3 hold - Seated Hip Abduction with Resistance  - 1 x daily - 7 x weekly - 2 sets - 10 reps - Sit to Stand  - 1 x daily - 7 x weekly - 3 sets - 5 reps - Seated March  - 1 x daily - 7 x weekly - 2 sets - 10 reps   ASSESSMENT:   CLINICAL IMPRESSION: Patient's BP in low/hypotensive range today.  She also forgot her SPC at home today.  Monitored BP throughout session and readings improved after exercise.  Discussed how this could contribute to feeling "lightheaded" and recommended she discuss with her PCP at her next appointment which pt states is scheduled for next week.  Improving strength will also help with transfers up and down from floor which she had difficulty with functionally at home this afternoon.  Overall she should continue to benefit from skilled PT treatment to address LE weakness/difficulty walking/decreased activity tolerance/decreased balance/fear of falling/gait abnormalities after sustaining a pelvic fx in 2023.   OBJECTIVE IMPAIRMENTS: Abnormal gait, decreased balance, decreased mobility, difficulty walking, decreased strength, postural dysfunction, and pain.    ACTIVITY LIMITATIONS: carrying, lifting, bending, squatting, stairs, and locomotion level   PARTICIPATION LIMITATIONS: meal prep, cleaning, shopping, community activity, and yard work   PERSONAL FACTORS: Age and Time since onset of injury/illness/exacerbation are also affecting patient's functional outcome.    REHAB POTENTIAL: Good   CLINICAL  DECISION MAKING: Stable/uncomplicated   EVALUATION COMPLEXITY: Low     GOALS: Goals reviewed with patient? Yes   SHORT TERM GOALS: Target date: 10/30/23 Pt will be able to perform STS without use of LE or UE compensatory patterns from elevated chair height 24 inches x5 to facilitate improved LE strength, balance, and walking endurance Baseline: unable Goal status: INITIAL       LONG TERM GOALS: Target date: 12/10/23   Improve LEFS >18 indicating pt able to perform her daily activities at home without being limited by LE weakness/pain Baseline: 42/80 (MCID is 9) Goal status: INITIAL   2.  Improve LE strength 1/2 MMT grade to promote pt being able to perform 30 min heavy housework, squat, get in and out of her car without being limited by LE weakness Baseline:  Goal status: INITIAL   3.  Pt will amb x 10 minutes and no loss of balance with least restrictive AD to amb into out of stores and around her home for housework without being limited by LE pain and weakness  Baseline:  Goal status: INITIAL     PLAN:   PT FREQUENCY: 2x/week   PT DURATION: 8 weeks   PLANNED INTERVENTIONS: 97110-Therapeutic exercises, 97530- Therapeutic activity, [id number]- Neuromuscular re-education, 97535- Self Care, and [id number]- Manual therapy   PLAN FOR NEXT SESSION: strengthening, balance, gait training   Lucrecia Sables, PT, DPT, OCS   Kin Penner, PT 11/21/2023, 5:20 PM

## 2023-11-26 ENCOUNTER — Ambulatory Visit

## 2023-11-26 DIAGNOSIS — R29898 Other symptoms and signs involving the musculoskeletal system: Secondary | ICD-10-CM

## 2023-11-26 DIAGNOSIS — M2550 Pain in unspecified joint: Secondary | ICD-10-CM | POA: Diagnosis not present

## 2023-11-26 DIAGNOSIS — M6281 Muscle weakness (generalized): Secondary | ICD-10-CM | POA: Diagnosis not present

## 2023-11-26 NOTE — Therapy (Signed)
 OUTPATIENT PHYSICAL THERAPY LOWER EXTREMITY TREATMENT/Progress Note      Patient Name: Taylor Mccarthy MRN: [id number] DOB:March 07, 1941, 83 y.o., female Today's Date: 11/26/23   END OF SESSION:   PT End of Session - 11/26/23 1718       Visit Number 10    Number of Visits 17     Date for PT Re-Evaluation 12/12/23     Authorization Type 2x/week x 8 weeks (recert needed 11/07/79, PN at 10th visit)     PT Start Time 1415    PT Stop Time 1500    PT Time Calculation (min) 45 min     Activity Tolerance Patient tolerated treatment well     Behavior During Therapy [facility] for tasks assessed/performed                       Past Medical History:  Diagnosis Date   Atrial fibrillation, persistent (HCC)     Cancer (HCC)      colon cancer 1999 s/p resection    History of colon polyps     Insomnia     Low back pain     Mitral regurgitation     Pap smear abnormality of cervix with ASCUS favoring benign     Permanent atrial fibrillation (HCC) 08/06/2022   Sciatica     Vitamin D  deficiency               Past Surgical History:  Procedure Laterality Date   COLON RESECTION        1999 for cancer    TONSILLECTOMY       WISDOM TOOTH EXTRACTION                Patient Active Problem List    Diagnosis Date Noted   Arthritis 09/10/2023   Polyarthralgia 09/10/2023   OAB (overactive bladder) 09/10/2023   Muscle spasm 03/11/2023   PVD (peripheral vascular disease) (HCC) 10/20/2022   History of pelvic fracture 10/20/2022   Anemia 10/20/2022   Acute dermatitis 10/16/2022   Atrophic vaginitis 10/16/2022   Candidiasis of vulva 10/16/2022   Fatigue 10/16/2022   Hip pain 10/16/2022   Leukorrhea 10/16/2022   Low back pain 10/16/2022   Motion sickness 10/16/2022   Neck sprain 10/16/2022   Osteoarthritis of hip 10/16/2022   Pain in limb 10/16/2022   Sciatica 10/16/2022   Lipid screening 08/06/2022   Chronic heart failure with preserved ejection fraction (HCC) 08/06/2022   Sacral fracture,  closed (HCC)     Rhabdomyolysis 11/13/2021   Pelvic fracture (HCC) 11/12/2021   Overactive bladder 06/27/2021   Osteopenia 05/25/2021   Abnormal thyroid  blood test 09/11/2019   Macrocytosis 09/11/2019   Mood disorder (HCC) 05/07/2019   Abnormal gait 05/07/2019   Urinary incontinence 05/07/2019   Longstanding persistent atrial fibrillation (HCC) 05/28/2018   History of colon cancer 05/28/2018   Vertigo 05/28/2018   DDD (degenerative disc disease), lumbar 05/28/2018   Hypothyroidism 05/28/2018   Cough 05/28/2018   Vitamin D  deficiency 05/28/2018   Insomnia 05/28/2018   Lesion of oral mucosa 05/28/2018   Cardiac arrhythmia 05/21/2018   Allergies 05/21/2018   Non-seasonal allergic rhinitis 05/08/2018   Unsteadiness 05/08/2018      PCP: Dr. Sueanne Emerald, MD   REFERRING PROVIDER: same   REFERRING DIAG: M25.50 (ICD-10-CM) - Polyarthralgia    THERAPY DIAG:  Polyarthralgia   Hip weakness   Muscle weakness (generalized)   Rationale for Evaluation and Treatment: Rehabilitation   ONSET DATE: 2023- pelvic fx  SUBJECTIVE:    SUBJECTIVE STATEMENT: Pt has a few concerns today: she reports pain/achy sensation in pelvis/hips and like her strength is limited after her pelvic fx last year; also having some dull/achy pain in wrist/hands and arms which she attributes to arthritis.  Planning to focus on lower body concerns today.   She feels like her legs are weak and her lower back is weak too.  She was very active prior to the fall.   She noticed sx ~3 months ago, but overall has had a challenging year including moving into a new home and navigating the unexpected loss of a close family member (daughter).   Feels like her walking is impaired.  Using a SPC for amb.   Fear of falling even though she has not fallen since the injury that resulted in pelvic fx last year.   She has been using the [facility] since the initial injury.  She mainly uses it out of the home.  She has a rollator at home she  uses.   She does report feeling unsteady/dizzy/vertigo feelings which began after her fall.  Has not been evaluated for this yet.     PERTINENT HISTORY: Taylor Mccarthy and fx pelvis- no surgery; did a course of inpatient rehab and PT after that PAIN:  Are you having pain? Yes 4/10   PRECAUTIONS: Fall   RED FLAGS: None      WEIGHT BEARING RESTRICTIONS: No   FALLS:  Has patient fallen in last 6 months? No; has had a few "wobbles"   LIVING ENVIRONMENT: Lives with: alone, she moved into her daughter's home Lives in: House/apartment Stairs: No no stairs; 1 step up but that's it Has following equipment at home: Single point cane; able to drive and takes care of her housework   OCCUPATION: retired; Conservation officer, historic buildings Programme researcher, broadcasting/film/video), worked in Engineer, production, Social research officer, government, painting    PLOF: Independent   PATIENT GOALS: strengthen her lower back and legs    NEXT MD VISIT: none scheduled    OBJECTIVE:  Note: Objective measures were completed at Evaluation unless otherwise noted.   DIAGNOSTIC FINDINGS: none recently, per chart review- April 2023 x-ray report pelvis/sacrum/coccyx   IMPRESSION: Acute displaced fractures of the right and left superior and inferior pubic rami.   IMPRESSION: Displaced fracture is seen in the right iliac bone in the medial margin of right acetabulum. Comminuted fractures are seen in the left superior pubic ramus and right inferior pubic ramus. These fractures are better evaluated in the radiographs of hip done today.   PATIENT SURVEYS:  LEFS 42/80   COGNITION: Overall cognitive status: Within functional limits for tasks assessed                         SENSATION: WFL   POSTURE: pt amb with SPC in R UE today, decreased gait speed and slight trunk flexion/hip flexion   PALPATION: Specific palpation deferred   LOWER EXTREMITY ROM:   Active ROM Right eval Left eval  Hip flexion Min limited compared to L normal  Hip extension      Hip  abduction      Hip adduction      Hip internal rotation Min limited compared to L normal  Hip external rotation      Knee flexion normal normal  Knee extension normal normal  Ankle dorsiflexion      Ankle plantarflexion      Ankle inversion      Ankle eversion       (  Blank rows = not tested)   LOWER EXTREMITY MMT:   MMT Right eval Left eval  Hip flexion 3+ 5  Hip extension 3+ 4+  Hip abduction 3+ 4+  Hip adduction      Hip internal rotation 3+ 4  Hip external rotation 4 4  Knee flexion 5 5  Knee extension 4 5  Ankle dorsiflexion      Ankle plantarflexion      Ankle inversion      Ankle eversion       (Blank rows = not tested)   LOWER EXTREMITY SPECIAL TESTS:  (-) seated slump test   FUNCTIONAL TESTS:  5 times sit to stand: pt compensates with leaning femur on edge of plinth instead of using LE muscles; cues for "stand tall" used;  3 attempts of 5X STS performed today; best time was 15 seconds, but pt unable to perform without use of UE or LE substitutions   GAIT: Distance walked: into clinic with SPC in R UE and SPC appears to be slightly taller than ideal height, will adjust as needed at next visit                                                                                                            TREATMENT DATE: 11/26/23 Subjective: Pt able to get an appointment scheduled at a rheumatologist office.  She has an appointment with her PCP early next month.  She reports not feeling great upon arrival.  No falls, but feeling a bit unsteady.  No UTI type sx.  No fever/nausea/chills. Describes low energy.  Has not had any recent changes in medications.  Is reporting "dizziness" and "vertigo" when getting up from bed.  No changes in vision.  0/10 pain  Objective: BP upon arrival sitting with arm elevated: 108/60 Updated goals (see below)   Therapeutic exercise: Sit to stand: practiced from standard chair: x5; 3 sets PT verbal/visual/tactile cues for using hip  hinge; scooting hips forward and bending knees slightly for optimal body mechanics Mini squat: 2x10 Seated marches: 2x10 3# Seated hip abd: with PT manual resistance x15 (blueband 2x10) Supine hip add: 5 second holds with PT manual resistance x15 Bridges: 2x10 (PT stabilized feet) SLR x20 ea- not today Standing hip abd: 3# 2x10 ea Standing hip extension: 3# 2x10 ea Standing hamstring curl: 3# 2x10 ea- not today Seated knee extension: 5# 10x ea, 2 sets Front step ups: 12x ea side PROM hip ER, flexion, hamstring stretch (pt reports "cramping" during exercises) throughout session: 4 min- not today Hip add with ball: 15x Hip abd with band: 15x blue TB  Hurdles: practicing for hip flexion active ROM and strength, 3 laps- forward with single UE support, 3 laps lateral direction with single UE support- not today  Amb in hallway x 4 laps with PT supervision, emphasizing symmetrical step length, upright posture, and overall endurance- not today   Neuro re-ed:  Abdominal bracing with diaphragmatic breathing: exhale + brace x 15, will benefit from more practice, had difficulty with coordination today- not  today Standing marches x15 ea without UE support SLS 5x ea LE, 5 seconds  Tandem stance 3x 10-15 seconds ea side Feet together on airex x 20 seconds, 5x- not today Feet together on airex with head up/down x10 ea- pt reports apprehension/fear during this because of difficulty Tandem walk line x 3 laps Sitting on blue physioball with alternating marches: x20- light hold on table for support; PT guarding from behind too for safety- not today Amb without SPC, (gait belt, PT close supervision) x1 min intervals, 4 min around clinic  Therapeutic activities: not today Forward hurdles: 5 laps, practiced with single UE support Stair navigation: 3 laps (4 steps) with b/l railing Heel raises: 2x20- focus on posterior chain mm activation during amb- pushoff phase of gait      PATIENT EDUCATION:   Education details: PT POC/goals, purpose and benefit of LE strengthening to improve balance and gait; discussed using SPC out of home and rollator in home; discussed exercise goals, HEP, instructions for exercises within PT session   Person educated: Patient Education method: Explanation and Verbal cues Education comprehension: verbalized understanding and needs further education   HOME EXERCISE PROGRAM: Practice using SPC on L UE  Access Code: [email or web address] URL: [email or web address] Date: 10/17/2023 Prepared by: Lucrecia Sables  Exercises - Supine Bridge  - 1 x daily - 7 x weekly - 2 sets - 10 reps - 3 hold - Seated Hip Abduction with Resistance  - 1 x daily - 7 x weekly - 2 sets - 10 reps - Sit to Stand  - 1 x daily - 7 x weekly - 3 sets - 5 reps - Seated March  - 1 x daily - 7 x weekly - 2 sets - 10 reps   ASSESSMENT:   CLINICAL IMPRESSION: Updated PT goals today and pt is making progress with this course of outpatient PT.  Objective improvements in strength and activity tolerance have occurred.  Her LEFS score has not significantly improved yet, recommend continued PT to further improve strength and balance and address fear of falling and to reduce fall risk.  Improving strength will also help with transfers up and down from floor which she had difficulty with functionally at home and needs to be able to do for taking care of her home and her cats and for her work as an Tree surgeon.  Gaffer to incorporate even more interventions for balance into POC for the next 6 weeks as pt expresses apprehension/fear of falling and is fearful during balance exercises in the clinic.  Her BP has been fairly low; recommended she f/u with her PCP about this and her recent report of not feeling well this week.  May benefit from a consult with one of my co-workers who specializes in vestibular rehab as she is describing a sense of "dizziness" and "vertigo" when getting up and down from bed sometimes.   Overall she should continue to benefit from skilled PT treatment to address LE weakness/difficulty walking/decreased activity tolerance/decreased balance/fear of falling/gait abnormalities after sustaining a pelvic fx in 2023.   OBJECTIVE IMPAIRMENTS: Abnormal gait, decreased balance, decreased mobility, difficulty walking, decreased strength, postural dysfunction, and pain.    ACTIVITY LIMITATIONS: carrying, lifting, bending, squatting, stairs, and locomotion level   PARTICIPATION LIMITATIONS: meal prep, cleaning, shopping, community activity, and yard work   PERSONAL FACTORS: Age and Time since onset of injury/illness/exacerbation are also affecting patient's functional outcome.    REHAB POTENTIAL: Good   CLINICAL DECISION MAKING: Stable/uncomplicated   EVALUATION COMPLEXITY: Low  GOALS: Goals reviewed with patient? Yes   SHORT TERM GOALS: Target date: 12/30/23 Pt will be able to perform STS without use of LE or UE compensatory patterns from elevated chair height 24 inches x5 to facilitate improved LE strength, balance, and walking endurance Baseline: unable; 11/26/23: able to perform without UE support now in 20 seconds Goal status: In progress      LONG TERM GOALS: Target date: 01/10/24   Improve LEFS >18 indicating pt able to perform her daily activities at home without being limited by LE weakness/pain Baseline: 42/80 (MCID is 9) 11/26/23: 36/80 (pt isn't feeling as well today) Goal status: In progress   2.  Improve LE strength 1/2 MMT grade to promote pt being able to perform 30 min heavy housework, squat, get in and out of her car without being limited by LE weakness Baseline: 11/26/23: R hip flex 4/5 (was 3+/5 at initial eval), knee extension 4/5 Goal status: In progress   3.  Pt will amb x 10 minutes and no loss of balance with least restrictive AD to amb into out of stores and around her home for housework without being limited by LE pain and weakness  Baseline: 11/26/23:  10-15 min using rollator at home Goal status: In progress     PLAN:   PT FREQUENCY: 2x/week   PT DURATION: 6 weeks   PLANNED INTERVENTIONS: 97110-Therapeutic exercises, 97530- Therapeutic activity, [id number]- Neuromuscular re-education, 97535- Self Care, and [id number]- Manual therapy   PLAN FOR NEXT SESSION: strengthening, balance, gait training   Lucrecia Sables, PT, DPT, OCS   Kin Penner, PT 11/26/2023, 5:20 PM

## 2023-11-28 ENCOUNTER — Ambulatory Visit

## 2023-12-03 ENCOUNTER — Ambulatory Visit: Attending: Family Medicine

## 2023-12-03 DIAGNOSIS — M2550 Pain in unspecified joint: Secondary | ICD-10-CM | POA: Diagnosis not present

## 2023-12-03 DIAGNOSIS — R29898 Other symptoms and signs involving the musculoskeletal system: Secondary | ICD-10-CM | POA: Diagnosis not present

## 2023-12-03 DIAGNOSIS — M6281 Muscle weakness (generalized): Secondary | ICD-10-CM | POA: Insufficient documentation

## 2023-12-03 NOTE — Therapy (Signed)
 OUTPATIENT PHYSICAL THERAPY LOWER EXTREMITY TREATMENT     Patient Name: Taylor Mccarthy MRN: [id number] DOB:[birth year], 83 y.o., female Today's Date: 12/03/23   END OF SESSION:   PT End of Session - 12/03/23 1718       Visit Number 11    Number of Visits 17     Date for PT Re-Evaluation 12/12/23     Authorization Type 2x/week x 8 weeks (recert needed 10/29/00, PN at 10th visit)     PT Start Time 1415    PT Stop Time 1500    PT Time Calculation (min) 45 min     Activity Tolerance Patient tolerated treatment well     Behavior During Therapy [facility] for tasks assessed/performed                       Past Medical History:  Diagnosis Date   Atrial fibrillation, persistent (HCC)     Cancer (HCC)      colon cancer 1999 s/p resection    History of colon polyps     Insomnia     Low back pain     Mitral regurgitation     Pap smear abnormality of cervix with ASCUS favoring benign     Permanent atrial fibrillation (HCC) 08/06/2022   Sciatica     Vitamin D  deficiency               Past Surgical History:  Procedure Laterality Date   COLON RESECTION        1999 for cancer    TONSILLECTOMY       WISDOM TOOTH EXTRACTION                Patient Active Problem List    Diagnosis Date Noted   Arthritis 09/10/2023   Polyarthralgia 09/10/2023   OAB (overactive bladder) 09/10/2023   Muscle spasm 03/11/2023   PVD (peripheral vascular disease) (HCC) 10/20/2022   History of pelvic fracture 10/20/2022   Anemia 10/20/2022   Acute dermatitis 10/16/2022   Atrophic vaginitis 10/16/2022   Candidiasis of vulva 10/16/2022   Fatigue 10/16/2022   Hip pain 10/16/2022   Leukorrhea 10/16/2022   Low back pain 10/16/2022   Motion sickness 10/16/2022   Neck sprain 10/16/2022   Osteoarthritis of hip 10/16/2022   Pain in limb 10/16/2022   Sciatica 10/16/2022   Lipid screening 08/06/2022   Chronic heart failure with preserved ejection fraction (HCC) 08/06/2022   Sacral fracture, closed (HCC)      Rhabdomyolysis 11/13/2021   Pelvic fracture (HCC) 11/12/2021   Overactive bladder 06/27/2021   Osteopenia 05/25/2021   Abnormal thyroid  blood test 09/11/2019   Macrocytosis 09/11/2019   Mood disorder (HCC) 05/07/2019   Abnormal gait 05/07/2019   Urinary incontinence 05/07/2019   Longstanding persistent atrial fibrillation (HCC) 05/28/2018   History of colon cancer 05/28/2018   Vertigo 05/28/2018   DDD (degenerative disc disease), lumbar 05/28/2018   Hypothyroidism 05/28/2018   Cough 05/28/2018   Vitamin D  deficiency 05/28/2018   Insomnia 05/28/2018   Lesion of oral mucosa 05/28/2018   Cardiac arrhythmia 05/21/2018   Allergies 05/21/2018   Non-seasonal allergic rhinitis 05/08/2018   Unsteadiness 05/08/2018      PCP: Dr. Sueanne Emerald, MD   REFERRING PROVIDER: same   REFERRING DIAG: M25.50 (ICD-10-CM) - Polyarthralgia    THERAPY DIAG:  Polyarthralgia   Hip weakness   Muscle weakness (generalized)   Rationale for Evaluation and Treatment: Rehabilitation   ONSET DATE: 2023- pelvic fx  SUBJECTIVE:    SUBJECTIVE STATEMENT: Pt has a few concerns today: she reports pain/achy sensation in pelvis/hips and like her strength is limited after her pelvic fx last year; also having some dull/achy pain in wrist/hands and arms which she attributes to arthritis.  Planning to focus on lower body concerns today.   She feels like her legs are weak and her lower back is weak too.  She was very active prior to the fall.   She noticed sx ~3 months ago, but overall has had a challenging year including moving into a new home and navigating the unexpected loss of a close family member (daughter).   Feels like her walking is impaired.  Using a SPC for amb.   Fear of falling even though she has not fallen since the injury that resulted in pelvic fx last year.   She has been using the [facility] since the initial injury.  She mainly uses it out of the home.  She has a rollator at home she uses.   She  does report feeling unsteady/dizzy/vertigo feelings which began after her fall.  Has not been evaluated for this yet.     PERTINENT HISTORY: [name] and fx pelvis- no surgery; did a course of inpatient rehab and PT after that PAIN:  Are you having pain? Yes 4/10   PRECAUTIONS: Fall   RED FLAGS: None      WEIGHT BEARING RESTRICTIONS: No   FALLS:  Has patient fallen in last 6 months? No; has had a few "wobbles"   LIVING ENVIRONMENT: Lives with: alone, she moved into her daughter's home Lives in: House/apartment Stairs: No no stairs; 1 step up but that's it Has following equipment at home: Single point cane; able to drive and takes care of her housework   OCCUPATION: retired; Conservation officer, historic buildings Programme researcher, broadcasting/film/video), worked in Engineer, production, Social research officer, government, painting    PLOF: Independent   PATIENT GOALS: strengthen her lower back and legs    NEXT MD VISIT: none scheduled    OBJECTIVE:  Note: Objective measures were completed at Evaluation unless otherwise noted.   DIAGNOSTIC FINDINGS: none recently, per chart review- April 2023 x-ray report pelvis/sacrum/coccyx   IMPRESSION: Acute displaced fractures of the right and left superior and inferior pubic rami.   IMPRESSION: Displaced fracture is seen in the right iliac bone in the medial margin of right acetabulum. Comminuted fractures are seen in the left superior pubic ramus and right inferior pubic ramus. These fractures are better evaluated in the radiographs of hip done today.   PATIENT SURVEYS:  LEFS 42/80   COGNITION: Overall cognitive status: Within functional limits for tasks assessed                         SENSATION: WFL   POSTURE: pt amb with SPC in R UE today, decreased gait speed and slight trunk flexion/hip flexion   PALPATION: Specific palpation deferred   LOWER EXTREMITY ROM:   Active ROM Right eval Left eval  Hip flexion Min limited compared to L normal  Hip extension      Hip abduction       Hip adduction      Hip internal rotation Min limited compared to L normal  Hip external rotation      Knee flexion normal normal  Knee extension normal normal  Ankle dorsiflexion      Ankle plantarflexion      Ankle inversion      Ankle eversion       (  Blank rows = not tested)   LOWER EXTREMITY MMT:   MMT Right eval Left eval  Hip flexion 3+ 5  Hip extension 3+ 4+  Hip abduction 3+ 4+  Hip adduction      Hip internal rotation 3+ 4  Hip external rotation 4 4  Knee flexion 5 5  Knee extension 4 5  Ankle dorsiflexion      Ankle plantarflexion      Ankle inversion      Ankle eversion       (Blank rows = not tested)   LOWER EXTREMITY SPECIAL TESTS:  (-) seated slump test   FUNCTIONAL TESTS:  5 times sit to stand: pt compensates with leaning femur on edge of plinth instead of using LE muscles; cues for "stand tall" used;  3 attempts of 5X STS performed today; best time was 15 seconds, but pt unable to perform without use of UE or LE substitutions   GAIT: Distance walked: into clinic with SPC in R UE and SPC appears to be slightly taller than ideal height, will adjust as needed at next visit                                                                                                            TREATMENT DATE: 12/03/23 Subjective: Pt reports no falls.  Her upper extremity pain is making it difficult to do her art and painting.  Has a consult with rheumatology scheduled.    0/10 pain pelvis  Objective:  Therapeutic exercise: Sit to stand: practiced from standard chair: x5; 3 sets PT verbal/visual/tactile cues for using hip hinge; scooting hips forward and bending knees slightly for optimal body mechanics Mini squat: 2x10 Seated marches: 2x10 3# Seated hip abd: with PT manual resistance x15 (blueband 2x10) Supine hip add: 5 second holds with PT manual resistance x15 Bridges: 2x10 (PT stabilized feet) Bridges with ball between knees, 5 sec holds x 15 SLR x20  ea Standing hip abd: 3# 2x10 ea- not today Standing hip extension: 3# 2x10 ea Standing hamstring curl: 5# 2x10 ea Seated knee extension: 5# 10x ea, 2 sets Front step ups: 12x ea side Hooklying LTR x 5 ea SKTC x 5 ea PROM hip ER, flexion, hamstring stretch (pt reports "cramping" during exercises) throughout session: 4 min- not today  Hurdles: practicing for hip flexion active ROM and strength, 3 laps- forward with single UE support, 3 laps lateral direction with single UE support- not today  Amb in hallway x 4 laps with PT supervision, emphasizing symmetrical step length, upright posture, and overall endurance- not today   Neuro re-ed:  Abdominal bracing with diaphragmatic breathing: exhale + brace x 15,added marches x20 Standing marches x15 ea without UE support SLS 5x ea LE, 5-8 second intervals Tandem stance 3x 10-15 seconds ea side Feet together on airex x 20 seconds, 5x- not today Feet together on airex with head up/down x10 ea- pt reports apprehension/fear during this because of difficulty Tandem walk line x 3 laps Sitting on blue physioball with alternating marches: x20-  light hold on table for support; PT guarding from behind too for safety- not today Amb without SPC, (gait belt, PT close supervision) x1 min intervals, 4 min around clinic  Therapeutic activities: not today Forward hurdles: 5 laps, practiced with single UE support Stair navigation: 3 laps (4 steps) with b/l railing Heel raises: 2x20- focus on posterior chain mm activation during amb- pushoff phase of gait     PATIENT EDUCATION:  Education details: PT POC/goals, purpose and benefit of LE strengthening to improve balance and gait; discussed using SPC out of home and rollator in home; discussed exercise goals, HEP, instructions for exercises within PT session   Person educated: Patient Education method: Explanation and Verbal cues Education comprehension: verbalized understanding and needs further education    HOME EXERCISE PROGRAM: Practice using SPC on L UE  Access Code: [email or web address] URL: [email or web address] Date: 10/17/2023 Prepared by: Lucrecia Sables  Exercises - Supine Bridge  - 1 x daily - 7 x weekly - 2 sets - 10 reps - 3 hold - Seated Hip Abduction with Resistance  - 1 x daily - 7 x weekly - 2 sets - 10 reps - Sit to Stand  - 1 x daily - 7 x weekly - 3 sets - 5 reps - Seated March  - 1 x daily - 7 x weekly - 2 sets - 10 reps   ASSESSMENT:   CLINICAL IMPRESSION: Pt was challenged appropriately with trunk/LE strengthening today.  No increase in low back or pelvic pain reported during exercises.  Recommend continued PT to further improve strength and balance and address fear of falling and to reduce fall risk.  Improving strength will also help with transfers up and down from floor which she had difficulty with functionally at home and needs to be able to do for taking care of her home and her cats and for her work as an Tree surgeon.  Gaffer to incorporate even more interventions for balance into POC for the next 6 weeks as pt expresses apprehension/fear of falling and is fearful during balance exercises in the clinic.  Her BP has been fairly low; recommended she f/u with her PCP about this and her recent report of not feeling well this week.  May benefit from a consult with one of my co-workers who specializes in vestibular rehab as she is describing a sense of "dizziness" and "vertigo" when getting up and down from bed sometimes.  Overall she should continue to benefit from skilled PT treatment to address LE weakness/difficulty walking/decreased activity tolerance/decreased balance/fear of falling/gait abnormalities after sustaining a pelvic fx in 2023.   OBJECTIVE IMPAIRMENTS: Abnormal gait, decreased balance, decreased mobility, difficulty walking, decreased strength, postural dysfunction, and pain.    ACTIVITY LIMITATIONS: carrying, lifting, bending, squatting, stairs, and  locomotion level   PARTICIPATION LIMITATIONS: meal prep, cleaning, shopping, community activity, and yard work   PERSONAL FACTORS: Age and Time since onset of injury/illness/exacerbation are also affecting patient's functional outcome.    REHAB POTENTIAL: Good   CLINICAL DECISION MAKING: Stable/uncomplicated   EVALUATION COMPLEXITY: Low     GOALS: Goals reviewed with patient? Yes   SHORT TERM GOALS: Target date: 12/30/23 Pt will be able to perform STS without use of LE or UE compensatory patterns from elevated chair height 24 inches x5 to facilitate improved LE strength, balance, and walking endurance Baseline: unable; 11/26/23: able to perform without UE support now in 20 seconds Goal status: In progress      LONG TERM GOALS: Target date: 01/10/24  Improve LEFS >18 indicating pt able to perform her daily activities at home without being limited by LE weakness/pain Baseline: 42/80 (MCID is 9) 11/26/23: 36/80 (pt isn't feeling as well today) Goal status: In progress   2.  Improve LE strength 1/2 MMT grade to promote pt being able to perform 30 min heavy housework, squat, get in and out of her car without being limited by LE weakness Baseline: 11/26/23: R hip flex 4/5 (was 3+/5 at initial eval), knee extension 4/5 Goal status: In progress   3.  Pt will amb x 10 minutes and no loss of balance with least restrictive AD to amb into out of stores and around her home for housework without being limited by LE pain and weakness  Baseline: 11/26/23: 10-15 min using rollator at home Goal status: In progress     PLAN:   PT FREQUENCY: 2x/week   PT DURATION: 6 weeks   PLANNED INTERVENTIONS: 97110-Therapeutic exercises, 97530- Therapeutic activity, [id number]- Neuromuscular re-education, 97535- Self Care, and [id number]- Manual therapy   PLAN FOR NEXT SESSION: strengthening, balance, gait training; recert needed 1/91/47   Lucrecia Sables, PT, DPT, OCS   Kin Penner, PT 12/03/2023, 5:20 PM

## 2023-12-05 ENCOUNTER — Ambulatory Visit

## 2023-12-05 DIAGNOSIS — M2550 Pain in unspecified joint: Secondary | ICD-10-CM | POA: Diagnosis not present

## 2023-12-05 DIAGNOSIS — R29898 Other symptoms and signs involving the musculoskeletal system: Secondary | ICD-10-CM

## 2023-12-05 DIAGNOSIS — M6281 Muscle weakness (generalized): Secondary | ICD-10-CM | POA: Diagnosis not present

## 2023-12-05 NOTE — Therapy (Signed)
 OUTPATIENT PHYSICAL THERAPY LOWER EXTREMITY TREATMENT     Patient Name: Taylor Mccarthy MRN: [id number] DOB:03-14-1941, 83 y.o., female Today's Date: 12/05/23   END OF SESSION:   PT End of Session - 12/05/23 1718       Visit Number 12    Number of Visits 17     Date for PT Re-Evaluation 12/12/23     Authorization Type 2x/week x 8 weeks (recert needed 11/07/79, PN at 10th visit)     PT Start Time 1415    PT Stop Time 1500    PT Time Calculation (min) 45 min     Activity Tolerance Patient tolerated treatment well     Behavior During Therapy [facility] for tasks assessed/performed                       Past Medical History:  Diagnosis Date   Atrial fibrillation, persistent (HCC)     Cancer (HCC)      colon cancer 1999 s/p resection    History of colon polyps     Insomnia     Low back pain     Mitral regurgitation     Pap smear abnormality of cervix with ASCUS favoring benign     Permanent atrial fibrillation (HCC) 08/06/2022   Sciatica     Vitamin D  deficiency               Past Surgical History:  Procedure Laterality Date   COLON RESECTION        1999 for cancer    TONSILLECTOMY       WISDOM TOOTH EXTRACTION                Patient Active Problem List    Diagnosis Date Noted   Arthritis 09/10/2023   Polyarthralgia 09/10/2023   OAB (overactive bladder) 09/10/2023   Muscle spasm 03/11/2023   PVD (peripheral vascular disease) (HCC) 10/20/2022   History of pelvic fracture 10/20/2022   Anemia 10/20/2022   Acute dermatitis 10/16/2022   Atrophic vaginitis 10/16/2022   Candidiasis of vulva 10/16/2022   Fatigue 10/16/2022   Hip pain 10/16/2022   Leukorrhea 10/16/2022   Low back pain 10/16/2022   Motion sickness 10/16/2022   Neck sprain 10/16/2022   Osteoarthritis of hip 10/16/2022   Pain in limb 10/16/2022   Sciatica 10/16/2022   Lipid screening 08/06/2022   Chronic heart failure with preserved ejection fraction (HCC) 08/06/2022   Sacral fracture, closed (HCC)      Rhabdomyolysis 11/13/2021   Pelvic fracture (HCC) 11/12/2021   Overactive bladder 06/27/2021   Osteopenia 05/25/2021   Abnormal thyroid  blood test 09/11/2019   Macrocytosis 09/11/2019   Mood disorder (HCC) 05/07/2019   Abnormal gait 05/07/2019   Urinary incontinence 05/07/2019   Longstanding persistent atrial fibrillation (HCC) 05/28/2018   History of colon cancer 05/28/2018   Vertigo 05/28/2018   DDD (degenerative disc disease), lumbar 05/28/2018   Hypothyroidism 05/28/2018   Cough 05/28/2018   Vitamin D  deficiency 05/28/2018   Insomnia 05/28/2018   Lesion of oral mucosa 05/28/2018   Cardiac arrhythmia 05/21/2018   Allergies 05/21/2018   Non-seasonal allergic rhinitis 05/08/2018   Unsteadiness 05/08/2018      PCP: Dr. Sueanne Emerald, MD   REFERRING PROVIDER: same   REFERRING DIAG: M25.50 (ICD-10-CM) - Polyarthralgia    THERAPY DIAG:  Polyarthralgia   Hip weakness   Muscle weakness (generalized)   Rationale for Evaluation and Treatment: Rehabilitation   ONSET DATE: 2023- pelvic fx  SUBJECTIVE:    SUBJECTIVE STATEMENT: Pt has a few concerns today: she reports pain/achy sensation in pelvis/hips and like her strength is limited after her pelvic fx last year; also having some dull/achy pain in wrist/hands and arms which she attributes to arthritis.  Planning to focus on lower body concerns today.   She feels like her legs are weak and her lower back is weak too.  She was very active prior to the fall.   She noticed sx ~3 months ago, but overall has had a challenging year including moving into a new home and navigating the unexpected loss of a close family member (daughter).   Feels like her walking is impaired.  Using a SPC for amb.   Fear of falling even though she has not fallen since the injury that resulted in pelvic fx last year.   She has been using the [facility] since the initial injury.  She mainly uses it out of the home.  She has a rollator at home she uses.   She  does report feeling unsteady/dizzy/vertigo feelings which began after her fall.  Has not been evaluated for this yet.     PERTINENT HISTORY: [name] and fx pelvis- no surgery; did a course of inpatient rehab and PT after that PAIN:  Are you having pain? Yes 4/10   PRECAUTIONS: Fall   RED FLAGS: None      WEIGHT BEARING RESTRICTIONS: No   FALLS:  Has patient fallen in last 6 months? No; has had a few "wobbles"   LIVING ENVIRONMENT: Lives with: alone, she moved into her daughter's home Lives in: House/apartment Stairs: No no stairs; 1 step up but that's it Has following equipment at home: Single point cane; able to drive and takes care of her housework   OCCUPATION: retired; Conservation officer, historic buildings Programme researcher, broadcasting/film/video), worked in Engineer, production, Social research officer, government, painting    PLOF: Independent   PATIENT GOALS: strengthen her lower back and legs    NEXT MD VISIT: none scheduled    OBJECTIVE:  Note: Objective measures were completed at Evaluation unless otherwise noted.   DIAGNOSTIC FINDINGS: none recently, per chart review- April 2023 x-ray report pelvis/sacrum/coccyx   IMPRESSION: Acute displaced fractures of the right and left superior and inferior pubic rami.   IMPRESSION: Displaced fracture is seen in the right iliac bone in the medial margin of right acetabulum. Comminuted fractures are seen in the left superior pubic ramus and right inferior pubic ramus. These fractures are better evaluated in the radiographs of hip done today.   PATIENT SURVEYS:  LEFS 42/80   COGNITION: Overall cognitive status: Within functional limits for tasks assessed                         SENSATION: WFL   POSTURE: pt amb with SPC in R UE today, decreased gait speed and slight trunk flexion/hip flexion   PALPATION: Specific palpation deferred   LOWER EXTREMITY ROM:   Active ROM Right eval Left eval  Hip flexion Min limited compared to L normal  Hip extension      Hip abduction       Hip adduction      Hip internal rotation Min limited compared to L normal  Hip external rotation      Knee flexion normal normal  Knee extension normal normal  Ankle dorsiflexion      Ankle plantarflexion      Ankle inversion      Ankle eversion       (  Blank rows = not tested)   LOWER EXTREMITY MMT:   MMT Right eval Left eval  Hip flexion 3+ 5  Hip extension 3+ 4+  Hip abduction 3+ 4+  Hip adduction      Hip internal rotation 3+ 4  Hip external rotation 4 4  Knee flexion 5 5  Knee extension 4 5  Ankle dorsiflexion      Ankle plantarflexion      Ankle inversion      Ankle eversion       (Blank rows = not tested)   LOWER EXTREMITY SPECIAL TESTS:  (-) seated slump test   FUNCTIONAL TESTS:  5 times sit to stand: pt compensates with leaning femur on edge of plinth instead of using LE muscles; cues for "stand tall" used;  3 attempts of 5X STS performed today; best time was 15 seconds, but pt unable to perform without use of UE or LE substitutions   GAIT: Distance walked: into clinic with SPC in R UE and SPC appears to be slightly taller than ideal height, will adjust as needed at next visit                                                                                                            TREATMENT DATE: 12/05/23 Subjective: Pt reports no falls.  Her upper extremity pain is making it difficult to do her art and painting.  Has a consult with rheumatology scheduled.  Overall getting up/down from sitting is feeling stronger since starting PT.  0/10 pain pelvis  Objective:  Therapeutic exercise: Sit to stand: practiced from standard chair: x5; 3 sets PT verbal/visual/tactile cues for using hip hinge; scooting hips forward and bending knees slightly for optimal body mechanics Mini squat: 2x10 Seated marches: 2x10 3# Seated hip abd: with PT manual resistance x15 (blueband 2x10) Supine hip add: 5 second holds with PT manual resistance x15 Bridges: 2x10 (PT stabilized  feet) Bridges with ball between knees, 5 sec holds x 15 SLR x20 ea Standing hip abd: 3# 2x10 ea- not today Standing hip extension: 3# 2x10 ea Standing hamstring curl: 5# 2x10 ea Seated knee extension: 5# 10x ea, 2 sets Front step ups: 12x ea side Hooklying LTR x 5 ea SKTC x 5 ea PROM hip ER, flexion, hamstring stretch (pt reports "cramping" during exercises) throughout session: 4 min- not today  Hurdles: practicing for hip flexion active ROM and strength, 3 laps- forward with single UE support, 3 laps lateral direction with single UE support- not today  Amb in hallway x 4 laps with PT supervision, emphasizing symmetrical step length, upright posture, and overall endurance- not today   Neuro re-ed:  Abdominal bracing with diaphragmatic breathing: exhale + brace x 15,added marches x20 Standing marches x15 ea without UE support SLS 5x ea LE, 5-8 second intervals Tandem stance 3x 10-15 seconds ea side Feet together on airex x 20 seconds, 5x- not today Feet together on airex with head up/down x10 ea- pt reports apprehension/fear during this because of difficulty Tandem walk line x  3 laps Sitting on blue physioball with alternating marches: x20- light hold on table for support; PT guarding from behind too for safety- not today Amb without SPC, (gait belt, PT close supervision) x1 min intervals, 4 min around clinic  Therapeutic activities: not today Forward hurdles: 5 laps, practiced with single UE support Stair navigation: 3 laps (4 steps) with b/l railing Heel raises: 2x20- focus on posterior chain mm activation during amb- pushoff phase of gait     PATIENT EDUCATION:  Education details: PT POC/goals, purpose and benefit of LE strengthening to improve balance and gait; discussed using SPC out of home and rollator in home; discussed exercise goals, HEP, instructions for exercises within PT session   Person educated: Patient Education method: Explanation and Verbal cues Education  comprehension: verbalized understanding and needs further education   HOME EXERCISE PROGRAM: Practice using SPC on L UE  Access Code: [email or web address] URL: [email or web address] Date: 10/17/2023 Prepared by: Lucrecia Sables  Exercises - Supine Bridge  - 1 x daily - 7 x weekly - 2 sets - 10 reps - 3 hold - Seated Hip Abduction with Resistance  - 1 x daily - 7 x weekly - 2 sets - 10 reps - Sit to Stand  - 1 x daily - 7 x weekly - 3 sets - 5 reps - Seated March  - 1 x daily - 7 x weekly - 2 sets - 10 reps   ASSESSMENT:   CLINICAL IMPRESSION: Pt remains apprehensive to perform balance exercises.  Discussed how performing them in clinic in parallel bars with PT guarding and gait belt is safe and beneficial.  She does demonstrate improved LE and trunk control with sit to stands today.  Improving strength will also help with transfers up and down from floor which she had difficulty with functionally at home and needs to be able to do for taking care of her home and her cats and for her work as an Tree surgeon.  May benefit from a consult with one of my co-workers who specializes in vestibular rehab as she is describing a sense of "dizziness" and "vertigo" when getting up and down from bed sometimes.  Overall she should continue to benefit from skilled PT treatment to address LE weakness/difficulty walking/decreased activity tolerance/decreased balance/fear of falling/gait abnormalities after sustaining a pelvic fx in 2023.   OBJECTIVE IMPAIRMENTS: Abnormal gait, decreased balance, decreased mobility, difficulty walking, decreased strength, postural dysfunction, and pain.    ACTIVITY LIMITATIONS: carrying, lifting, bending, squatting, stairs, and locomotion level   PARTICIPATION LIMITATIONS: meal prep, cleaning, shopping, community activity, and yard work   PERSONAL FACTORS: Age and Time since onset of injury/illness/exacerbation are also affecting patient's functional outcome.    REHAB  POTENTIAL: Good   CLINICAL DECISION MAKING: Stable/uncomplicated   EVALUATION COMPLEXITY: Low     GOALS: Goals reviewed with patient? Yes   SHORT TERM GOALS: Target date: 12/30/23 Pt will be able to perform STS without use of LE or UE compensatory patterns from elevated chair height 24 inches x5 to facilitate improved LE strength, balance, and walking endurance Baseline: unable; 11/26/23: able to perform without UE support now in 20 seconds Goal status: In progress      LONG TERM GOALS: Target date: 01/10/24   Improve LEFS >18 indicating pt able to perform her daily activities at home without being limited by LE weakness/pain Baseline: 42/80 (MCID is 9) 11/26/23: 36/80 (pt isn't feeling as well today) Goal status: In progress   2.  Improve LE strength  1/2 MMT grade to promote pt being able to perform 30 min heavy housework, squat, get in and out of her car without being limited by LE weakness Baseline: 11/26/23: R hip flex 4/5 (was 3+/5 at initial eval), knee extension 4/5 Goal status: In progress   3.  Pt will amb x 10 minutes and no loss of balance with least restrictive AD to amb into out of stores and around her home for housework without being limited by LE pain and weakness  Baseline: 11/26/23: 10-15 min using rollator at home Goal status: In progress     PLAN:   PT FREQUENCY: 2x/week   PT DURATION: 6 weeks   PLANNED INTERVENTIONS: 97110-Therapeutic exercises, 97530- Therapeutic activity, [id number]- Neuromuscular re-education, 97535- Self Care, and [id number]- Manual therapy   PLAN FOR NEXT SESSION: strengthening, balance, gait training; recert needed 01/01/53   Lucrecia Sables, PT, DPT, OCS   Kin Penner, PT 12/05/2023, 5:20 PM

## 2023-12-10 ENCOUNTER — Encounter

## 2023-12-10 ENCOUNTER — Ambulatory Visit

## 2023-12-10 ENCOUNTER — Ambulatory Visit: Payer: Medicare HMO | Admitting: Family Medicine

## 2023-12-11 ENCOUNTER — Other Ambulatory Visit: Payer: Self-pay | Admitting: Family Medicine

## 2023-12-11 DIAGNOSIS — E039 Hypothyroidism, unspecified: Secondary | ICD-10-CM

## 2023-12-12 ENCOUNTER — Ambulatory Visit

## 2023-12-17 ENCOUNTER — Ambulatory Visit

## 2023-12-17 NOTE — Progress Notes (Signed)
 Cardiology Office Note    Date:  12/18/2023   ID:  Taylor Mccarthy, Taylor Mccarthy 1940-08-10, MRN [id number]  PCP:  Valli Gaw, MD  Cardiologist:  Antionette Kirks, MD  Electrophysiologist:  None   Chief Complaint: Follow up  History of Present Illness:   Taylor Mccarthy is a 83 y.o. female with history of permanent A. fib on Xarelto , HFpEF, moderate mitral and tricuspid regurgitation, frequent PVCs, hypothyroidism, colon cancer, and chronic dizziness/vertigo who presents for follow-up of A-fib.   She was previously followed by Dr. Joanne Muckle. Echo in 06/2017 showed and EF of 55-60%, mild LVH, severe biatrial enlargement, moderate to severe mitral regurgitation, severe tricuspid regurgitation, and PASP 37-50 mmHg. She has reported undergoing DCCV twice in the past, which were not successful leading to rate control strategy. She was seen in the office in 01/2019 noting dizziness and lightheadedness. She was noted to have frequent PVCs on 12-lead EKG at that visit. Her Toprol  was titrated to 50 mg daily. Echo showed an EF of 55-60%, mild LVH, normal RVSF and cavity size, PASP 39.2 mmHg, severe biatrial enlargement, moderate to severe mitral regurgitation, moderate to severe tricuspid regurgitation. Overall, this was a stable study when compared to 2018.  Echo in 04/2020 showed an EF of 45 to 50%, global hypokinesis, low normal RV systolic function with severely enlarged RV cavity size and normal PASP, severe biatrial enlargement, moderate to severe mitral regurgitation with moderate holosystolic prolapse of both leaflets of the mitral valve, severe tricuspid regurgitation, and an estimated right atrial pressure of 3 mmHg.  This study was personally reviewed by her primary cardiologist and felt to demonstrate normal LV systolic function and a stable moderate mitral and tricuspid regurgitation with only mild pulmonary hypertension.  She was seen in the office in 11/2020 noting bilateral lower extremity paresthesias as well as  a cold lower extremities.   Most recent echo from 06/02/2021 demonstrated a low normal LV systolic function of 50 to 55%, no regional wall motion abnormalities, mild LVH, normal RV systolic function and ventricular cavity size, moderately elevated PASP estimated at 46.8 mmHg, severe biatrial enlargement, moderate to severe mitral regurgitation with moderate late systolic prolapse of both leaflets of the mitral valve, severe tricuspid regurgitation, and an estimated right atrial pressure of 15 mmHg.     She was admitted to the hospital in 10/2021 after sustaining a mechanical fall and was found to have a pelvic fracture with moderate extraperitoneal hemorrhage and hematoma in the pelvis without need for evacuation.  She was evaluated by orthopedic surgery with no indication for surgical intervention.  Hospital course was complicated by mild rhabdomyolysis in the setting of immobility following a fall.   She was last seen in the office in 03/2023 with stable chronic dizziness/vertigo.  Echo at that time showed an EF of 60 to 65%, no regional wall motion abnormalities, normal RV systolic function with mildly enlarged ventricular cavity size, normal RVSP, moderately dilated left atrium, severely dilated right atrium, moderate to severe mitral regurgitation with moderate late systolic prolapse of both leaflets of the mitral valve, severe tricuspid regurgitation, mild aortic insufficiency, and an estimated right atrial pressure of 3 mmHg.  She comes in today accompanied by her daughter and is without symptoms of angina or cardiac decompensation.  No palpitations or progressive dyspnea.  She notes a longstanding history of dizziness that is noticeable with quick positional changes and improves within a few seconds.  She did have a mechanical fall in the garage yesterday after a  box fell on her.  She did not hit her head or suffer LOC.  No symptoms of melena or hematochezia.  She also notes a longstanding history of  hyperpigmentation of the bilateral feet in the setting of chronic venous insufficiency.  Not currently elevating legs or wearing compression socks.  Weight stable.  Functional status is limited by arthritic changes.   Labs independently reviewed: 09/2023 - TSH normal, BUN 13, serum creatinine 0.64, potassium 4.2, albumin 4.4, AST/ALT normal, Hgb 13.1, PLT 245 08/2023 - TC 151, TG 84, HDL 59, LDL 75  Past Medical History:  Diagnosis Date   Atrial fibrillation, persistent (HCC)    Cancer (HCC)    colon cancer 1999 s/p resection    History of colon polyps    Insomnia    Low back pain    Mitral regurgitation    Pap smear abnormality of cervix with ASCUS favoring benign    Permanent atrial fibrillation (HCC) 08/06/2022   Sciatica    Vitamin D  deficiency     Past Surgical History:  Procedure Laterality Date   COLON RESECTION     1999 for cancer    TONSILLECTOMY     WISDOM TOOTH EXTRACTION      Current Medications: Current Meds  Medication Sig   acetaminophen  (TYLENOL ) 500 MG tablet Take 2 tablets (1,000 mg total) by mouth every 8 (eight) hours.   Calcium  Carbonate (CALCIUM  600 PO) Take 600 mg by mouth daily.   fexofenadine  (ALLEGRA  ALLERGY) 180 MG tablet Take 1 tablet (180 mg total) by mouth daily.   metoprolol  succinate (TOPROL -XL) 50 MG 24 hr tablet Take 0.5 tablets (25 mg total) by mouth daily.   Multiple Vitamin (MULTIVITAMIN) tablet Take 1 tablet by mouth daily.   sertraline  (ZOLOFT ) 100 MG tablet Take 1 tablet (100 mg total) by mouth daily.   solifenacin  (VESICARE ) 10 MG tablet Take 1 tablet (10 mg total) by mouth daily.   SYNTHROID  75 MCG tablet TAKE ONE TABLET BY MOUTH ONCE DAILY BEFORE BREAKFAST.   XARELTO  20 MG TABS tablet TAKE ONE TABLET BY MOUTH ONCE DAILY WITH SUPPER    Allergies:   Morpholine salicylate, Augmentin [amoxicillin-pot clavulanate], Codeine, and Other   Social History   Socioeconomic History   Marital status: Divorced    Spouse name: Not on file    Number of children: Not on file   Years of education: Not on file   Highest education level: Not on file  Occupational History   Not on file  Tobacco Use   Smoking status: Former   Smokeless tobacco: Never   Tobacco comments:    former quit in 30s light smoker per pt   Vaping Use   Vaping status: Never Used  Substance and Sexual Activity   Alcohol use: Not Currently    Alcohol/week: 14.0 standard drinks of alcohol    Types: 14 Glasses of wine per week    Comment: 2 glasses at night   Drug use: Not Currently   Sexual activity: Not Currently  Other Topics Concern   Not on file  Social History Narrative   Divorced    College grad    Moved from [place] to [place]   Former Warehouse manager    No guns   Wears seat belts    Social Drivers of Health   Financial Resource Strain: Low Risk  (04/25/2021)   Overall Financial Resource Strain (CARDIA)    Difficulty of Paying Living Expenses: Not hard at all  Food  Insecurity: No Food Insecurity (04/25/2021)   Hunger Vital Sign    Worried About Running Out of Food in the Last Year: Never true    Ran Out of Food in the Last Year: Never true  Transportation Needs: No Transportation Needs (04/25/2021)   PRAPARE - Administrator, Civil Service (Medical): No    Lack of Transportation (Non-Medical): No  Physical Activity: Sufficiently Active (04/25/2021)   Exercise Vital Sign    Days of Exercise per Week: 3 days    Minutes of Exercise per Session: 60 min  Stress: No Stress Concern Present (04/25/2021)   [employer] of Occupational Health - Occupational Stress Questionnaire    Feeling of Stress : Not at all  Social Connections: Unknown (04/25/2021)   Social Connection and Isolation Panel [NHANES]    Frequency of Communication with Friends and Family: More than three times a week    Frequency of Social Gatherings with Friends and Family: More than three times a week    Attends Religious Services: Not  on Marketing executive or Organizations: Not on file    Attends Banker Meetings: Not on file    Marital Status: Not on file     Family History:  The patient's family history includes Heart disease in her father; Hyperlipidemia in her father; Osteopenia in her sister.  ROS:   12-point review of systems is negative unless otherwise noted in the HPI.   EKGs/Labs/Other Studies Reviewed:    Studies reviewed were summarized above. The additional studies were reviewed today:  2D echo 03/12/2023: 1. Left ventricular ejection fraction, by estimation, is 60 to 65%. The  left ventricle has normal function. The left ventricle has no regional  wall motion abnormalities. Left ventricular diastolic parameters are  indeterminate. The average left  ventricular global longitudinal strain is -12.7 %.   2. Right ventricular systolic function is normal. The right ventricular  size is mildly enlarged. There is normal pulmonary artery systolic  pressure. The estimated right ventricular systolic pressure is 34.4 mmHg.   3. Left atrial size was moderately dilated.   4. Right atrial size was severely dilated.   5. The mitral valve is normal in structure. Moderate to severe mitral  valve regurgitation. No evidence of mitral stenosis. There is moderate  late systolic prolapse of both leaflets of the mitral valve.   6. Tricuspid valve regurgitation is severe.   7. The aortic valve is tricuspid. Aortic valve regurgitation is mild. No  aortic stenosis is present.   8. The inferior vena cava is normal in size with greater than 50%  respiratory variability, suggesting right atrial pressure of 3 mmHg.  __________   2D echo 06/02/2021: 1. Left ventricular ejection fraction, by estimation, is 50 to 55%. The  left ventricle has low normal function. The left ventricle has no regional  wall motion abnormalities. There is mild left ventricular hypertrophy.  Left ventricular diastolic   parameters are consistent with Grade II diastolic dysfunction  (pseudonormalization).   2. Right ventricular systolic function is normal. The right ventricular  size is normal. There is moderately elevated pulmonary artery systolic  pressure. The estimated right ventricular systolic pressure is 46.8 mmHg.   3. Left atrial size was severely dilated.   4. Right atrial size was severely dilated.   5. The mitral valve is normal in structure. Moderate to severe mitral  valve regurgitation. No evidence of mitral stenosis. There is moderate  late systolic  prolapse of both leaflets of the mitral valve.   6. Tricuspid valve regurgitation is severe.   7. The aortic valve is normal in structure. Aortic valve regurgitation is  not visualized. No aortic stenosis is present.   8. The inferior vena cava is dilated in size with <50% respiratory  variability, suggesting right atrial pressure of 15 mmHg.   Comparison(s): LVEF 45-50%, Mod-Sev MR, MVP, Sev TR. __________   2D echo 05/18/2020: 1. Left ventricular ejection fraction, by estimation, is 45 to 50%. The  left ventricle has mildly decreased function. The left ventricle  demonstrates global hypokinesis. There is mild left ventricular  hypertrophy. Left ventricular diastolic parameters  are indeterminate.   2. Right ventricular systolic function is low normal. The right  ventricular size is severely enlarged. There is normal pulmonary artery  systolic pressure.   3. Left atrial size was severely dilated.   4. Right atrial size was severely dilated.   5. The mitral valve is abnormal. Moderate to severe mitral valve  regurgitation. There is moderate holosystolic prolapse of both leaflets of  the mitral valve.   6. Tricuspid valve regurgitation is severe.   7. The aortic valve is tricuspid. Aortic valve regurgitation is not  visualized. No aortic stenosis is present.   8. The inferior vena cava is normal in size with greater than 50%   respiratory variability, suggesting right atrial pressure of 3 mmHg. __________   2D echo 03/27/2019: 1. The left ventricle has normal systolic function, with an ejection  fraction of 55-60%. The cavity size was normal. There is mildly increased  left ventricular wall thickness. Left ventricular diastolic Doppler  parameters are indeterminate.   2. The right ventricle has normal systolic function. The cavity was  normal. There is no increase in right ventricular wall thickness. Right  ventricular systolic pressure is mildly elevated with an estimated  pressure of 39.2 mmHg.   3. Left atrial size was severely dilated.   4. Right atrial size was severely dilated.   5. Mitral valve regurgitation is moderate to severe   6. Tricuspid valve regurgitation is moderate-severe.   7. Rhythm appears to be atrial fibrillation   8. Details above appear consistent with prior study dated 06/2017   EKG:  EKG is ordered today.  The EKG ordered today demonstrates A-fib, 62 bpm, poor R wave progression along the precordial leads, nonspecific inferior ST-T changes, consistent with prior tracing  Recent Labs: 09/07/2023: ALT 27; BUN 13; Creatinine, Ser 0.64; Hemoglobin 13.1; Platelets 245; Potassium 4.2; Sodium 144  Recent Lipid Panel    Component Value Date/Time   CHOL 151 08/04/2022 1010   CHOL 198 06/03/2018 1026   TRIG 84.0 08/04/2022 1010   HDL 59.30 08/04/2022 1010   HDL 75 06/03/2018 1026   CHOLHDL 3 08/04/2022 1010   VLDL 16.8 08/04/2022 1010   LDLCALC 75 08/04/2022 1010   LDLCALC 98 06/03/2018 1026    PHYSICAL EXAM:    VS:  BP 116/76   Pulse 62   Ht 5\' 6"  (1.676 m)   Wt 147 lb 6.4 oz (66.9 kg)   SpO2 96%   BMI 23.79 kg/m   BMI: Body mass index is 23.79 kg/m.  Physical Exam Vitals reviewed.  Constitutional:      Appearance: She is well-developed.  HENT:     Head: Normocephalic and atraumatic.  Eyes:     General:        Right eye: No discharge.  Left eye: No discharge.   Neck:     Vascular: No JVD.  Cardiovascular:     Rate and Rhythm: Normal rate. Rhythm irregularly irregular.     Pulses:          Dorsalis pedis pulses are 2+ on the right side and 2+ on the left side.       Posterior tibial pulses are 2+ on the right side and 2+ on the left side.     Heart sounds: S1 normal and S2 normal. Heart sounds not distant. No midsystolic click and no opening snap. Murmur heard.     High-pitched blowing holosystolic murmur is present with a grade of 2/6 at the apex.     No friction rub.  Pulmonary:     Effort: Pulmonary effort is normal. No respiratory distress.     Breath sounds: Normal breath sounds. No decreased breath sounds, wheezing, rhonchi or rales.  Chest:     Chest wall: No tenderness.  Abdominal:     General: There is no distension.     Palpations: Abdomen is soft.     Tenderness: There is no abdominal tenderness.  Musculoskeletal:     Cervical back: Normal range of motion.     Right lower leg: No edema.     Left lower leg: No edema.     Comments: Chronic hyperpigmentation noted along the bilateral feet and ankles.  Skin:    General: Skin is warm and dry.     Nails: There is no clubbing.  Neurological:     Mental Status: She is alert and oriented to person, place, and time.  Psychiatric:        Speech: Speech normal.        Behavior: Behavior normal.        Thought Content: Thought content normal.        Judgment: Judgment normal.     Wt Readings from Last 3 Encounters:  12/18/23 147 lb 6.4 oz (66.9 kg)  09/07/23 148 lb 8 oz (67.4 kg)  03/26/23 139 lb (63 kg)     ASSESSMENT & PLAN:   Permanent A-fib: Ventricular rate is well-controlled on Toprol -XL 25 mg daily.  CHA2DS2-VASc at least 4 (CHF, age x2, sex category).  Remains on rivaroxaban  20 mg.  Creatinine clearance 67.5.  Recent labs stable.  No symptoms concerning for bleeding.  HFpEF: Euvolemic and well compensated.  Not requiring standing loop diuretic.  Defer addition of MRA or  SGLT2 inhibitor given lack of heart failure symptoms.  Update echo as below.  Mitral and tricuspid regurgitation: Stable moderate to severe mitral and tricuspid regurgitation on echo in 03/2023 dating back to 2021.  Update echo.  Chronic venous insufficiency/stasis: Obtain ABI to confirm normal flow.  Recommend leg elevation and compression socks.     Disposition: F/u with Dr. Alvenia Aus or an APP in 2 months.   Medication Adjustments/Labs and Tests Ordered: Current medicines are reviewed at length with the patient today.  Concerns regarding medicines are outlined above. Medication changes, Labs and Tests ordered today are summarized above and listed in the Patient Instructions accessible in Encounters.   Signed, Varney Gentleman, PA-C 12/18/2023 5:19 PM     [facility] [place] [address] [place], [place] [address] [phone]

## 2023-12-18 ENCOUNTER — Ambulatory Visit: Attending: Physician Assistant | Admitting: Physician Assistant

## 2023-12-18 ENCOUNTER — Encounter: Payer: Self-pay | Admitting: Physician Assistant

## 2023-12-18 VITALS — BP 116/76 | HR 62 | Ht 66.0 in | Wt 147.4 lb

## 2023-12-18 DIAGNOSIS — I878 Other specified disorders of veins: Secondary | ICD-10-CM

## 2023-12-18 DIAGNOSIS — I071 Rheumatic tricuspid insufficiency: Secondary | ICD-10-CM

## 2023-12-18 DIAGNOSIS — I5032 Chronic diastolic (congestive) heart failure: Secondary | ICD-10-CM

## 2023-12-18 DIAGNOSIS — I872 Venous insufficiency (chronic) (peripheral): Secondary | ICD-10-CM | POA: Diagnosis not present

## 2023-12-18 DIAGNOSIS — I340 Nonrheumatic mitral (valve) insufficiency: Secondary | ICD-10-CM | POA: Diagnosis not present

## 2023-12-18 DIAGNOSIS — I4821 Permanent atrial fibrillation: Secondary | ICD-10-CM | POA: Diagnosis not present

## 2023-12-18 NOTE — Patient Instructions (Signed)
 Medication Instructions:  Your Physician recommend you continue on your current medication as directed.    *If you need a refill on your cardiac medications before your next appointment, please call your pharmacy*  Lab Work: None ordered at this time  If you have labs (blood work) drawn today and your tests are completely normal, you will receive your results only by: MyChart Message (if you have MyChart) OR A paper copy in the mail If you have any lab test that is abnormal or we need to change your treatment, we will call you to review the results.  Testing/Procedures: Your physician has requested that you have an echocardiogram. Echocardiography is a painless test that uses sound waves to create images of your heart. It provides your doctor with information about the size and shape of your heart and how well your heart's chambers and valves are working.   You may receive an ultrasound enhancing agent through an IV if needed to better visualize your heart during the echo. This procedure takes approximately one hour.  There are no restrictions for this procedure.  This will take place at [address] [facility] [address] [facility]) [address], [place] [address]  Please note: We ask at that you not bring children with you during ultrasound (echo/ vascular) testing. Due to room size and safety concerns, children are not allowed in the ultrasound rooms during exams. Our front office staff cannot provide observation of children in our lobby area while testing is being conducted. An adult accompanying a patient to their appointment will only be allowed in the ultrasound room at the discretion of the ultrasound technician under special circumstances. We apologize for any inconvenience.   Your physician has requested that you have an abdominal aorta duplex. During this test, an ultrasound is used to evaluate the aorta. Allow 30 minutes for this exam. Do not eat after midnight the day before and avoid  carbonated beverages. This will take place at [address], [place] [address]   Your physician has requested that you have an ankle brachial index (ABI). During this test an ultrasound and blood pressure cuff are used to evaluate the arteries that supply the arms and legs with blood.  Allow thirty minutes for this exam.  There are no restrictions or special instructions.  This will take place at [address] [facility] [address]) [address], [place] [address]  Please note: We ask at that you not bring children with you during ultrasound (echo/ vascular) testing. Due to room size and safety concerns, children are not allowed in the ultrasound rooms during exams. Our front office staff cannot provide observation of children in our lobby area while testing is being conducted. An adult accompanying a patient to their appointment will only be allowed in the ultrasound room at the discretion of the ultrasound technician under special circumstances. We apologize for any inconvenience.   Follow-Up: At [facility], you and your health needs are our priority.  As part of our continuing mission to provide you with exceptional heart care, our providers are all part of one team.  This team includes your primary Cardiologist (physician) and Advanced Practice Providers or APPs (Physician Assistants and Nurse Practitioners) who all work together to provide you with the care you need, when you need it.  Your next appointment:   2 month(s)  Provider:   You may see Antionette Kirks, MD or one of the following Advanced Practice Providers on your designated Care Team:  Laneta Pintos, NP Gildardo Labrador, PA-C Varney Gentleman, PA-C Cadence Gennaro Khat, PA-C Ronald Cockayne, NP Morey Ar, NP    We recommend signing up for the patient portal called "MyChart".  Sign up information is provided on this After Visit Summary.  MyChart is used to connect with patients for Virtual  Visits (Telemedicine).  Patients are able to view lab/test results, encounter notes, upcoming appointments, etc.  Non-urgent messages can be sent to your provider as well.   To learn more about what you can do with MyChart, go to [email or web address].

## 2023-12-19 ENCOUNTER — Encounter

## 2023-12-26 ENCOUNTER — Encounter

## 2023-12-28 ENCOUNTER — Ambulatory Visit: Attending: Physician Assistant

## 2023-12-28 DIAGNOSIS — I340 Nonrheumatic mitral (valve) insufficiency: Secondary | ICD-10-CM | POA: Diagnosis not present

## 2023-12-31 ENCOUNTER — Encounter

## 2024-01-01 LAB — ECHOCARDIOGRAM COMPLETE
AR max vel: 1.74 cm2
AV Area VTI: 1.86 cm2
AV Area mean vel: 1.59 cm2
AV Mean grad: 3 mmHg
AV Peak grad: 6 mmHg
Ao pk vel: 1.22 m/s
Area-P 1/2: 3.53 cm2
Calc EF: 60.7 %
P 1/2 time: 412 ms
S' Lateral: 3 cm
Single Plane A2C EF: 70.3 %
Single Plane A4C EF: 50.6 %

## 2024-01-02 ENCOUNTER — Ambulatory Visit: Payer: Self-pay | Admitting: Nurse Practitioner

## 2024-01-04 ENCOUNTER — Ambulatory Visit (HOSPITAL_COMMUNITY)

## 2024-01-07 ENCOUNTER — Encounter

## 2024-01-09 ENCOUNTER — Encounter

## 2024-01-16 DIAGNOSIS — M2560 Stiffness of unspecified joint, not elsewhere classified: Secondary | ICD-10-CM | POA: Diagnosis not present

## 2024-01-16 DIAGNOSIS — M79642 Pain in left hand: Secondary | ICD-10-CM | POA: Diagnosis not present

## 2024-01-16 DIAGNOSIS — R768 Other specified abnormal immunological findings in serum: Secondary | ICD-10-CM | POA: Diagnosis not present

## 2024-01-16 DIAGNOSIS — M79641 Pain in right hand: Secondary | ICD-10-CM | POA: Diagnosis not present

## 2024-01-16 DIAGNOSIS — Z6824 Body mass index (BMI) 24.0-24.9, adult: Secondary | ICD-10-CM | POA: Diagnosis not present

## 2024-01-22 ENCOUNTER — Ambulatory Visit (HOSPITAL_COMMUNITY)

## 2024-02-18 ENCOUNTER — Other Ambulatory Visit: Payer: Self-pay | Admitting: Physician Assistant

## 2024-02-19 ENCOUNTER — Ambulatory Visit: Admitting: Physician Assistant

## 2024-03-03 ENCOUNTER — Other Ambulatory Visit: Payer: Self-pay | Admitting: Physician Assistant

## 2024-03-03 ENCOUNTER — Ambulatory Visit

## 2024-03-03 ENCOUNTER — Ambulatory Visit: Payer: Self-pay | Admitting: Nurse Practitioner

## 2024-03-03 ENCOUNTER — Ambulatory Visit: Attending: Physician Assistant

## 2024-03-03 DIAGNOSIS — I340 Nonrheumatic mitral (valve) insufficiency: Secondary | ICD-10-CM

## 2024-03-03 DIAGNOSIS — L819 Disorder of pigmentation, unspecified: Secondary | ICD-10-CM | POA: Diagnosis not present

## 2024-03-03 DIAGNOSIS — I4821 Permanent atrial fibrillation: Secondary | ICD-10-CM

## 2024-03-03 DIAGNOSIS — I071 Rheumatic tricuspid insufficiency: Secondary | ICD-10-CM

## 2024-03-03 DIAGNOSIS — I872 Venous insufficiency (chronic) (peripheral): Secondary | ICD-10-CM

## 2024-03-03 DIAGNOSIS — I878 Other specified disorders of veins: Secondary | ICD-10-CM

## 2024-03-03 DIAGNOSIS — I5032 Chronic diastolic (congestive) heart failure: Secondary | ICD-10-CM

## 2024-03-03 LAB — VAS US ABI WITH/WO TBI
Left ABI: 1.1
Right ABI: 1.14

## 2024-03-04 ENCOUNTER — Other Ambulatory Visit: Payer: Self-pay | Admitting: Cardiovascular Disease

## 2024-03-04 DIAGNOSIS — I4891 Unspecified atrial fibrillation: Secondary | ICD-10-CM

## 2024-03-04 NOTE — Telephone Encounter (Signed)
 Prescription refill request for Xarelto  received.  Indication: AF Last office visit: 12/18/23  R Dunn PA-C Weight: 66.6kg Age: 83 Scr: 0.64 on 09/07/23  Epic CrCl: 67.57  Based on above findings Xarelto  20mg  daily is the appropriate dose.  Refill approved.

## 2024-03-07 ENCOUNTER — Telehealth: Payer: Self-pay | Admitting: Family Medicine

## 2024-03-07 NOTE — Telephone Encounter (Signed)
 Spoke with Pt's daughter, Connor Pepper, who is pt's caretaker and the primary contact # on the chart. She was quite rude the entire call, stating her Mother would not be coming into the office anytime soon and that it is completely ridiculous that I would expect an 83 year old woman to come into the office and be billed just to meet a new Dr. She cut me off several times while I was attempting to speak with her, I let her know I was just doing my job and trying to schedule her Mother, that unfortunately if we aren't able to schedule her TOC, the pt would be without a physician.. She continued to be rude. We left the call as she would ask her Mother and either be back or she wouldn't. [facility]

## 2024-03-07 NOTE — Telephone Encounter (Signed)
 The patient's daughter, Connor Pepper, called back and asked to speak with the practice administrator; however, she was out of the office. I talked to the patient's daughter, who elaborated about her mother having several appointments, her age, and being tired due to the number of doctor visits she had. She stated that she would talk with her mother, and if she wants to be seen, her mother will make that decision. I informed her that we were calling to schedule a transfer of care to ensure continuity of care, and if the patient were seen in the ED, she would have someone to follow up with. The daughter stated that her mother had made it to 73 years, and if she wanted to make an appointment, she would give us  a call.

## 2024-03-17 NOTE — Progress Notes (Unsigned)
 Cardiology Office Note    Date:  03/20/2024   ID:  Taylor, Mccarthy 1940/12/12, MRN [id number]  PCP:  Patient, No Pcp Per  Cardiologist:  Deatrice Cage, MD  Electrophysiologist:  None   Chief Complaint: Follow-up  History of Present Illness:   Taylor Mccarthy is a 83 y.o. female with history of permanent A. fib on Xarelto , HFpEF, moderate mitral and tricuspid regurgitation, frequent PVCs, hypothyroidism, colon cancer, and chronic dizziness/vertigo who presents for follow-up of echo.  She was previously followed by Dr. Robinson. Echo in 06/2017 showed and EF of 55-60%, mild LVH, severe biatrial enlargement, moderate to severe mitral regurgitation, severe tricuspid regurgitation, and PASP 37-50 mmHg. She has reported undergoing DCCV twice in the past, which were not successful leading to rate control strategy. She was seen in the office in 01/2019 noting dizziness and lightheadedness. She was noted to have frequent PVCs on 12-lead EKG at that visit. Her Toprol  was titrated to 50 mg daily. Echo showed an EF of 55-60%, mild LVH, normal RVSF and cavity size, PASP 39.2 mmHg, severe biatrial enlargement, moderate to severe mitral regurgitation, moderate to severe tricuspid regurgitation. Overall, this was a stable study when compared to 2018.  Echo in 04/2020 showed an EF of 45 to 50%, global hypokinesis, low normal RV systolic function with severely enlarged RV cavity size and normal PASP, severe biatrial enlargement, moderate to severe mitral regurgitation with moderate holosystolic prolapse of both leaflets of the mitral valve, severe tricuspid regurgitation, and an estimated right atrial pressure of 3 mmHg.  This study was personally reviewed by her primary cardiologist and felt to demonstrate normal LV systolic function and a stable moderate mitral and tricuspid regurgitation with only mild pulmonary hypertension.  Echo from 06/02/2021 demonstrated a low normal LV systolic function of 50 to 55%, no regional  wall motion abnormalities, mild LVH, normal RV systolic function and ventricular cavity size, moderately elevated PASP estimated at 46.8 mmHg, severe biatrial enlargement, moderate to severe mitral regurgitation with moderate late systolic prolapse of both leaflets of the mitral valve, severe tricuspid regurgitation, and an estimated right atrial pressure of 15 mmHg.     She was admitted to the hospital in 10/2021 after sustaining a mechanical fall and was found to have a pelvic fracture with moderate extraperitoneal hemorrhage and hematoma in the pelvis without need for evacuation.  She was evaluated by orthopedic surgery with no indication for surgical intervention.  Hospital course was complicated by mild rhabdomyolysis in the setting of immobility following a fall.   She was seen in the office in 03/2023 with stable chronic dizziness/vertigo.  Echo at that time showed an EF of 60 to 65%, no regional wall motion abnormalities, normal RV systolic function with mildly enlarged ventricular cavity size, normal RVSP, moderately dilated left atrium, severely dilated right atrium, moderate to severe mitral regurgitation with moderate late systolic prolapse of both leaflets of the mitral valve, severe tricuspid regurgitation, mild aortic insufficiency, and an estimated right atrial pressure of 3 mmHg.  She was last seen in the office on 12/18/2023 continuing to report a longstanding history of dizziness that was noticeable with quick positional changes as well as a longstanding history of hyperpigmentation of the bilateral feet in the setting of chronic venous insufficiency.  Echo in 11/2023 showed an EF of 55 to 60%, no regional wall motion abnormalities, mild LVH, normal RV systolic function and ventricular cavity size, mildly elevated RVSP estimated at 36.5 mmHg, severely dilated left atrium, myxomatous mitral valve  with moderate regurgitation and mild prolapse of both leaflets of the mitral valve, moderate to  severe tricuspid regurgitation, trivial aortic insufficiency, mildly dilated pulmonary artery, and an estimated right atrial pressure of 8 mmHg.  ABI normal bilaterally with abnormal toe brachial index bilaterally.  She comes in accompanied by her daughter today and is without symptoms of angina or cardiac decompensation.  She was evaluated by rheumatology since she was last seen and placed on low-dose steroid course for PMR with significant improvement in arthralgias and pedal hyperpigmentation.  However, patient prefers not to be on a low-dose of steroids, concern for weight gain.  No significant lower extremity swelling, abdominal distention, or progressive orthopnea.  Weight stable.  Needs a new PCP.   Labs independently reviewed: 09/2023 - TSH normal, BUN 13, serum creatinine 0.64, potassium 4.2, albumin 4.4, AST/ALT normal, Hgb 13.1, PLT 245 08/2023 - TC 151, TG 84, HDL 59, LDL 75  Past Medical History:  Diagnosis Date   Atrial fibrillation, persistent (HCC)    Cancer (HCC)    colon cancer 1999 s/p resection    History of colon polyps    Insomnia    Low back pain    Mitral regurgitation    Pap smear abnormality of cervix with ASCUS favoring benign    Permanent atrial fibrillation (HCC) 08/06/2022   Sciatica    Vitamin D  deficiency     Past Surgical History:  Procedure Laterality Date   COLON RESECTION     1999 for cancer    TONSILLECTOMY     WISDOM TOOTH EXTRACTION      Current Medications: Current Meds  Medication Sig   acetaminophen  (TYLENOL ) 500 MG tablet Take 2 tablets (1,000 mg total) by mouth every 8 (eight) hours.   Calcium  Carbonate (CALCIUM  600 PO) Take 600 mg by mouth daily.   fexofenadine  (ALLEGRA  ALLERGY) 180 MG tablet Take 1 tablet (180 mg total) by mouth daily.   metoprolol  succinate (TOPROL -XL) 50 MG 24 hr tablet TAKE ONE HALF (1/2) TABLET BY MOUTH DAILY.   Multiple Vitamin (MULTIVITAMIN) tablet Take 1 tablet by mouth daily.   sertraline  (ZOLOFT ) 100 MG  tablet Take 1 tablet (100 mg total) by mouth daily.   solifenacin  (VESICARE ) 10 MG tablet Take 1 tablet (10 mg total) by mouth daily.   SYNTHROID  75 MCG tablet TAKE ONE TABLET BY MOUTH ONCE DAILY BEFORE BREAKFAST.   XARELTO  20 MG TABS tablet TAKE ONE TABLET BY MOUTH ONCE DAILY WITH SUPPER.    Allergies:   Morpholine salicylate, Augmentin [amoxicillin-pot clavulanate], Codeine, and Other   Social History   Socioeconomic History   Marital status: Divorced    Spouse name: Not on file   Number of children: Not on file   Years of education: Not on file   Highest education level: Not on file  Occupational History   Not on file  Tobacco Use   Smoking status: Former   Smokeless tobacco: Never   Tobacco comments:    former quit in 30s light smoker per pt   Vaping Use   Vaping status: Never Used  Substance and Sexual Activity   Alcohol use: Not Currently    Alcohol/week: 14.0 standard drinks of alcohol    Types: 14 Glasses of wine per week    Comment: 2 glasses at night   Drug use: Not Currently   Sexual activity: Not Currently  Other Topics Concern   Not on file  Social History Narrative   Divorced    College grad  Moved from [place] to [place]   Former Warehouse manager    No guns   Wears seat belts    Social Drivers of Corporate investment banker Strain: Low Risk  (04/25/2021)   Overall Financial Resource Strain (CARDIA)    Difficulty of Paying Living Expenses: Not hard at all  Food Insecurity: No Food Insecurity (04/25/2021)   Hunger Vital Sign    Worried About Running Out of Food in the Last Year: Never true    Ran Out of Food in the Last Year: Never true  Transportation Needs: No Transportation Needs (04/25/2021)   PRAPARE - Administrator, Civil Service (Medical): No    Lack of Transportation (Non-Medical): No  Physical Activity: Sufficiently Active (04/25/2021)   Exercise Vital Sign    Days of Exercise per Week: 3 days     Minutes of Exercise per Session: 60 min  Stress: No Stress Concern Present (04/25/2021)   [employer] of Occupational Health - Occupational Stress Questionnaire    Feeling of Stress : Not at all  Social Connections: Unknown (04/25/2021)   Social Connection and Isolation Panel    Frequency of Communication with Friends and Family: More than three times a week    Frequency of Social Gatherings with Friends and Family: More than three times a week    Attends Religious Services: Not on Marketing executive or Organizations: Not on file    Attends Banker Meetings: Not on file    Marital Status: Not on file     Family History:  The patient's family history includes Heart disease in her father; Hyperlipidemia in her father; Osteopenia in her sister.  ROS:   12-point review of systems is negative unless otherwise noted in the HPI.   EKGs/Labs/Other Studies Reviewed:    Studies reviewed were summarized above. The additional studies were reviewed today:  ABI 03/03/2024: ABI/TBIToday's ABIToday's TBIPrevious ABIPrevious TBI  +-------+-----------+-----------+------------+------------+  Right 1.14       .57        1.03        .95           +-------+-----------+-----------+------------+------------+  Left  1.10       .66        1.08        .88           +-------+-----------+-----------+------------+------------+   Bilateral ABIs appear essentially unchanged compared to prior study on  01/10/2021. Bilateral TBIs appear decreased compared to prior study on  01/10/2021.    Summary:  Right: Resting right ankle-brachial index is within normal range. The  right toe-brachial index is abnormal.   Left: Resting left ankle-brachial index is within normal range. The left  toe-brachial index is abnormal.  __________  2D echo 12/28/2023: 1. Left ventricular ejection fraction, by estimation, is 55 to 60%. The  left ventricle has normal function. The left  ventricle has no regional  wall motion abnormalities. There is mild left ventricular hypertrophy.  Indeterminate diastolic filling due to  E-A fusion.   2. Right ventricular systolic function is normal. The right ventricular  size is severely enlarged. There is mildly elevated pulmonary artery  systolic pressure.   3. Left atrial size was severely dilated.   4. The mitral valve is myxomatous. Moderate mitral valve regurgitation.  There is mild prolapse of both leaflets of the mitral valve.   5. Tricuspid valve regurgitation is moderate to severe.  6. The aortic valve is tricuspid. Aortic valve regurgitation is trivial.  No aortic stenosis is present.   7. Mildly dilated pulmonary artery.   8. The inferior vena cava is dilated in size with >50% respiratory  variability, suggesting right atrial pressure of 8 mmHg.  __________  2D echo 03/12/2023: 1. Left ventricular ejection fraction, by estimation, is 60 to 65%. The  left ventricle has normal function. The left ventricle has no regional  wall motion abnormalities. Left ventricular diastolic parameters are  indeterminate. The average left  ventricular global longitudinal strain is -12.7 %.   2. Right ventricular systolic function is normal. The right ventricular  size is mildly enlarged. There is normal pulmonary artery systolic  pressure. The estimated right ventricular systolic pressure is 34.4 mmHg.   3. Left atrial size was moderately dilated.   4. Right atrial size was severely dilated.   5. The mitral valve is normal in structure. Moderate to severe mitral  valve regurgitation. No evidence of mitral stenosis. There is moderate  late systolic prolapse of both leaflets of the mitral valve.   6. Tricuspid valve regurgitation is severe.   7. The aortic valve is tricuspid. Aortic valve regurgitation is mild. No  aortic stenosis is present.   8. The inferior vena cava is normal in size with greater than 50%  respiratory variability,  suggesting right atrial pressure of 3 mmHg.  __________   2D echo 06/02/2021: 1. Left ventricular ejection fraction, by estimation, is 50 to 55%. The  left ventricle has low normal function. The left ventricle has no regional  wall motion abnormalities. There is mild left ventricular hypertrophy.  Left ventricular diastolic  parameters are consistent with Grade II diastolic dysfunction  (pseudonormalization).   2. Right ventricular systolic function is normal. The right ventricular  size is normal. There is moderately elevated pulmonary artery systolic  pressure. The estimated right ventricular systolic pressure is 46.8 mmHg.   3. Left atrial size was severely dilated.   4. Right atrial size was severely dilated.   5. The mitral valve is normal in structure. Moderate to severe mitral  valve regurgitation. No evidence of mitral stenosis. There is moderate  late systolic prolapse of both leaflets of the mitral valve.   6. Tricuspid valve regurgitation is severe.   7. The aortic valve is normal in structure. Aortic valve regurgitation is  not visualized. No aortic stenosis is present.   8. The inferior vena cava is dilated in size with <50% respiratory  variability, suggesting right atrial pressure of 15 mmHg.   Comparison(s): LVEF 45-50%, Mod-Sev MR, MVP, Sev TR. __________   2D echo 05/18/2020: 1. Left ventricular ejection fraction, by estimation, is 45 to 50%. The  left ventricle has mildly decreased function. The left ventricle  demonstrates global hypokinesis. There is mild left ventricular  hypertrophy. Left ventricular diastolic parameters  are indeterminate.   2. Right ventricular systolic function is low normal. The right  ventricular size is severely enlarged. There is normal pulmonary artery  systolic pressure.   3. Left atrial size was severely dilated.   4. Right atrial size was severely dilated.   5. The mitral valve is abnormal. Moderate to severe mitral valve   regurgitation. There is moderate holosystolic prolapse of both leaflets of  the mitral valve.   6. Tricuspid valve regurgitation is severe.   7. The aortic valve is tricuspid. Aortic valve regurgitation is not  visualized. No aortic stenosis is present.   8. The inferior  vena cava is normal in size with greater than 50%  respiratory variability, suggesting right atrial pressure of 3 mmHg. __________   2D echo 03/27/2019: 1. The left ventricle has normal systolic function, with an ejection  fraction of 55-60%. The cavity size was normal. There is mildly increased  left ventricular wall thickness. Left ventricular diastolic Doppler  parameters are indeterminate.   2. The right ventricle has normal systolic function. The cavity was  normal. There is no increase in right ventricular wall thickness. Right  ventricular systolic pressure is mildly elevated with an estimated  pressure of 39.2 mmHg.   3. Left atrial size was severely dilated.   4. Right atrial size was severely dilated.   5. Mitral valve regurgitation is moderate to severe   6. Tricuspid valve regurgitation is moderate-severe.   7. Rhythm appears to be atrial fibrillation   8. Details above appear consistent with prior study dated 06/2017   EKG:  EKG is not ordered today.    Recent Labs: 09/07/2023: ALT 27; BUN 13; Creatinine, Ser 0.64; Hemoglobin 13.1; Platelets 245; Potassium 4.2; Sodium 144  Recent Lipid Panel    Component Value Date/Time   CHOL 151 08/04/2022 1010   CHOL 198 06/03/2018 1026   TRIG 84.0 08/04/2022 1010   HDL 59.30 08/04/2022 1010   HDL 75 06/03/2018 1026   CHOLHDL 3 08/04/2022 1010   VLDL 16.8 08/04/2022 1010   LDLCALC 75 08/04/2022 1010   LDLCALC 98 06/03/2018 1026    PHYSICAL EXAM:    VS:  BP (!) 122/50 (BP Location: Left Arm, Patient Position: Sitting)   Pulse 65   Ht 5' 5 (1.651 m)   Wt 151 lb 9.6 oz (68.8 kg)   SpO2 97%   BMI 25.23 kg/m   BMI: Body mass index is 25.23  kg/m.  Physical Exam Constitutional:      Appearance: She is well-developed.  HENT:     Head: Normocephalic and atraumatic.  Eyes:     General:        Right eye: No discharge.        Left eye: No discharge.  Cardiovascular:     Rate and Rhythm: Normal rate and regular rhythm.     Pulses:          Posterior tibial pulses are 2+ on the right side and 2+ on the left side.     Heart sounds: S1 normal and S2 normal. Heart sounds not distant. No midsystolic click and no opening snap. Murmur heard.     High-pitched blowing holosystolic murmur is present with a grade of 2/6 at the apex.     No friction rub.  Pulmonary:     Effort: Pulmonary effort is normal. No respiratory distress.     Breath sounds: Normal breath sounds. No decreased breath sounds, wheezing, rhonchi or rales.  Musculoskeletal:     Cervical back: Normal range of motion.     Right lower leg: No edema.     Left lower leg: No edema.     Comments: Chronic hyperpigmentation noted along the bilateral feet and ankles.  Skin:    General: Skin is warm and dry.     Nails: There is no clubbing.  Neurological:     Mental Status: She is alert and oriented to person, place, and time.  Psychiatric:        Speech: Speech normal.        Behavior: Behavior normal.        Thought  Content: Thought content normal.        Judgment: Judgment normal.     Wt Readings from Last 3 Encounters:  03/20/24 151 lb 9.6 oz (68.8 kg)  12/18/23 147 lb 6.4 oz (66.9 kg)  09/07/23 148 lb 8 oz (67.4 kg)     ASSESSMENT & PLAN:   Permanent A-fib: Ventricular rate well-controlled on Toprol -XL 25 mg daily.  CHA2DS2-VASc at least 4 (CHF, age x 2, sex category).  She remains on rivaroxaban  20 mg.  Creatinine clearance 69.1.  Check BMP and CBC.  HFpEF: Continue optimal blood pressure control.  Not requiring a standing loop diuretic.  Defer addition of MRA or SGLT2 inhibitor given lack of heart failure symptoms.  Mitral and tricuspid regurgitation:  Echo in 11/2023 showed myxomatous mitral valve with moderate regurgitation and mild prolapse of both leaflets of the mitral valve as well as moderate to severe tricuspid regurgitation.  Aortic insufficiency was trivial without evidence of aortic stenosis.  Overall, findings are stable dating back to at least 2018.  Continue to monitor.  Chronic venous insufficiency/stasis: ABI normal earlier this month.  Recommend leg elevation and compression socks.  PMR: Managed by rheumatology.     Disposition: F/u with Dr. Darron or an APP in 6 months.   Medication Adjustments/Labs and Tests Ordered: Current medicines are reviewed at length with the patient today.  Concerns regarding medicines are outlined above. Medication changes, Labs and Tests ordered today are summarized above and listed in the Patient Instructions accessible in Encounters.   Signed, Bernardino Bring, PA-C 03/20/2024 11:48 AM     [facility] [place] [address] [place], [place] [address] [phone]

## 2024-03-20 ENCOUNTER — Ambulatory Visit: Attending: Physician Assistant | Admitting: Physician Assistant

## 2024-03-20 ENCOUNTER — Encounter: Payer: Self-pay | Admitting: Physician Assistant

## 2024-03-20 VITALS — BP 122/50 | HR 65 | Ht 65.0 in | Wt 151.6 lb

## 2024-03-20 DIAGNOSIS — I5032 Chronic diastolic (congestive) heart failure: Secondary | ICD-10-CM

## 2024-03-20 DIAGNOSIS — I872 Venous insufficiency (chronic) (peripheral): Secondary | ICD-10-CM

## 2024-03-20 DIAGNOSIS — Z758 Other problems related to medical facilities and other health care: Secondary | ICD-10-CM

## 2024-03-20 DIAGNOSIS — I4821 Permanent atrial fibrillation: Secondary | ICD-10-CM

## 2024-03-20 DIAGNOSIS — I878 Other specified disorders of veins: Secondary | ICD-10-CM

## 2024-03-20 DIAGNOSIS — I340 Nonrheumatic mitral (valve) insufficiency: Secondary | ICD-10-CM | POA: Diagnosis not present

## 2024-03-20 DIAGNOSIS — I071 Rheumatic tricuspid insufficiency: Secondary | ICD-10-CM

## 2024-03-20 DIAGNOSIS — Z79899 Other long term (current) drug therapy: Secondary | ICD-10-CM

## 2024-03-20 NOTE — Patient Instructions (Signed)
 Medication Instructions:  Your physician recommends that you continue on your current medications as directed. Please refer to the Current Medication list given to you today.   *If you need a refill on your cardiac medications before your next appointment, please call your pharmacy*  Lab Work: Your provider would like for you to have following labs drawn today BMeT and CBC.   If you have labs (blood work) drawn today and your tests are completely normal, you will receive your results only by: MyChart Message (if you have MyChart) OR A paper copy in the mail If you have any lab test that is abnormal or we need to change your treatment, we will call you to review the results.  Follow-Up: At [facility], you and your health needs are our priority.  As part of our continuing mission to provide you with exceptional heart care, our providers are all part of one team.  This team includes your primary Cardiologist (physician) and Advanced Practice Providers or APPs (Physician Assistants and Nurse Practitioners) who all work together to provide you with the care you need, when you need it.  Your next appointment:   6 month(s)  Provider:   You may see Bernardino Bring, PA-C  We recommend signing up for the patient portal called MyChart.  Sign up information is provided on this After Visit Summary.  MyChart is used to connect with patients for Virtual Visits (Telemedicine).  Patients are able to view lab/test results, encounter notes, upcoming appointments, etc.  Non-urgent messages can be sent to your provider as well.   To learn more about what you can do with MyChart, go to [email or web address].

## 2024-03-21 LAB — CBC
Hematocrit: 42.2 % (ref 34.0–46.6)
Hemoglobin: 13.8 g/dL (ref 11.1–15.9)
MCH: 33.1 pg — ABNORMAL HIGH (ref 26.6–33.0)
MCHC: 32.7 g/dL (ref 31.5–35.7)
MCV: 101 fL — ABNORMAL HIGH (ref 79–97)
Platelets: 202 x10E3/uL (ref 150–450)
RBC: 4.17 x10E6/uL (ref 3.77–5.28)
RDW: 12.7 % (ref 11.7–15.4)
WBC: 4.9 x10E3/uL (ref 3.4–10.8)

## 2024-03-21 LAB — BASIC METABOLIC PANEL WITH GFR
BUN/Creatinine Ratio: 25 (ref 12–28)
BUN: 15 mg/dL (ref 8–27)
CO2: 23 mmol/L (ref 20–29)
Calcium: 9.5 mg/dL (ref 8.7–10.3)
Chloride: 105 mmol/L (ref 96–106)
Creatinine, Ser: 0.59 mg/dL (ref 0.57–1.00)
Glucose: 54 mg/dL — ABNORMAL LOW (ref 70–99)
Potassium: 4.1 mmol/L (ref 3.5–5.2)
Sodium: 142 mmol/L (ref 134–144)
eGFR: 88 mL/min/1.73 (ref >59)

## 2024-03-24 ENCOUNTER — Ambulatory Visit (INDEPENDENT_AMBULATORY_CARE_PROVIDER_SITE_OTHER): Admitting: Family Medicine

## 2024-03-24 ENCOUNTER — Encounter: Payer: Self-pay | Admitting: Family Medicine

## 2024-03-24 VITALS — BP 99/55 | HR 68 | Ht 65.0 in | Wt 150.2 lb

## 2024-03-24 DIAGNOSIS — I4811 Longstanding persistent atrial fibrillation: Secondary | ICD-10-CM

## 2024-03-24 DIAGNOSIS — E559 Vitamin D deficiency, unspecified: Secondary | ICD-10-CM

## 2024-03-24 DIAGNOSIS — F39 Unspecified mood [affective] disorder: Secondary | ICD-10-CM

## 2024-03-24 DIAGNOSIS — E039 Hypothyroidism, unspecified: Secondary | ICD-10-CM

## 2024-03-24 DIAGNOSIS — Z7689 Persons encountering health services in other specified circumstances: Secondary | ICD-10-CM

## 2024-03-24 DIAGNOSIS — M545 Low back pain, unspecified: Secondary | ICD-10-CM

## 2024-03-24 DIAGNOSIS — G8929 Other chronic pain: Secondary | ICD-10-CM

## 2024-03-24 DIAGNOSIS — N3281 Overactive bladder: Secondary | ICD-10-CM

## 2024-03-24 MED ORDER — OXYBUTYNIN CHLORIDE ER 5 MG PO TB24: 5.0000 mg | ORAL_TABLET | Freq: Every day | ORAL | 2 refills | Status: AC

## 2024-03-24 NOTE — Progress Notes (Signed)
 New Patient Office Visit  Subjective   Patient ID: Taylor Mccarthy, female    DOB: September 20, 1940  Age: 83 y.o. MRN: [id number]  CC:  Chief Complaint  Patient presents with   Establish Care   HPI Last PCP: [facility]. [facility]  Specialists: cardiology, rheumatology,  PMHx: persistent a-fib, hypothyroidism, mood, insomnia, low back pain, mitral regurgitation, vitamin D  deficiency   Discussed the use of AI scribe software for clinical note transcription with the patient, who gave verbal consent to proceed.  History of Present Illness   Taylor Mccarthy is an 83 year old female who presents to establish with [facility] and has concerns about urinary and anal incontinence.  She has been experiencing urinary incontinence for several months, which occurs when she stands up after urinating. Anal incontinence also occurs during bathroom visits. There are no changes in bowel movements, which remain regular. She previously tried VESIcare  for urinary incontinence but did not take it long enough to determine its effectiveness.  She has a history of arthritis, which developed after a fall three years ago resulting in a fractured pelvis in four places. 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 She is currently on a low dose of prednisone, which she initially resisted due to concerns about weight gain, but was persuaded by her daughter to continue. Currently followed by [facility].   Her past medical history includes atrial fibrillation, managed with Xarelto  and metoprolol , and hypothyroidism, for which she takes Synthroid . She recently increased her Synthroid  dose. She also takes Zoloft , prescribed during the COVID-19 pandemic to help manage her mood. She reports being a 'great sleeper' and denies any current issues with insomnia.  She has a significant family history of longevity, with her  mother living to 101 years and her sisters living into their late 72s. She has experienced significant personal loss recently, including the death of her daughter, two sisters, a brother-in-law, and a boyfriend.  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 She has a history of extensive travel and currently resides in a community she enjoys.     Outpatient Encounter Medications as of 03/24/2024  Medication Sig   Calcium  Carbonate (CALCIUM  600 PO) Take 600 mg by mouth daily.   fexofenadine  (ALLEGRA  ALLERGY) 180 MG tablet Take 1 tablet (180 mg total) by mouth daily.   metoprolol  succinate (TOPROL -XL) 50 MG 24 hr tablet TAKE ONE HALF (1/2) TABLET BY MOUTH DAILY.   Multiple Vitamin (MULTIVITAMIN) tablet Take 1 tablet by mouth daily.   sertraline  (ZOLOFT ) 100 MG tablet Take 1 tablet (100 mg total) by mouth daily.   SYNTHROID  75 MCG tablet TAKE ONE TABLET BY MOUTH ONCE DAILY BEFORE BREAKFAST.   XARELTO  20 MG TABS tablet TAKE ONE TABLET BY MOUTH ONCE DAILY WITH SUPPER.   [DISCONTINUED] solifenacin  (VESICARE ) 10 MG tablet Take 1 tablet (10 mg total) by mouth daily.   [DISCONTINUED] acetaminophen  (TYLENOL ) 500 MG tablet Take 2 tablets (1,000 mg total) by mouth every 8 (eight) hours. (Patient not taking: Reported on 03/24/2024)   No facility-administered encounter medications on file as of 03/24/2024.    Patient Active Problem List   Diagnosis Date Noted   Arthritis 09/10/2023   Polyarthralgia 09/10/2023   OAB (overactive bladder) 09/10/2023   Muscle spasm 03/11/2023   PVD (peripheral vascular disease) (HCC) 10/20/2022   History of pelvic fracture 10/20/2022  Anemia 10/20/2022   Acute dermatitis 10/16/2022   Atrophic vaginitis 10/16/2022   Candidiasis of vulva 10/16/2022   Fatigue 10/16/2022   Hip pain 10/16/2022   Leukorrhea 10/16/2022   Low back pain 10/16/2022   Motion sickness 10/16/2022   Neck sprain 10/16/2022    Osteoarthritis of hip 10/16/2022   Pain in limb 10/16/2022   Sciatica 10/16/2022   Lipid screening 08/06/2022   Chronic heart failure with preserved ejection fraction (HCC) 08/06/2022   Sacral fracture, closed (HCC)    Rhabdomyolysis 11/13/2021   Pelvic fracture (HCC) 11/12/2021   Overactive bladder 06/27/2021   Osteopenia 05/25/2021   Abnormal thyroid  blood test 09/11/2019   Macrocytosis 09/11/2019   Mood disorder (HCC) 05/07/2019   Abnormal gait 05/07/2019   Urinary incontinence 05/07/2019   Longstanding persistent atrial fibrillation (HCC) 05/28/2018   History of colon cancer 05/28/2018   Vertigo 05/28/2018   DDD (degenerative disc disease), lumbar 05/28/2018   Hypothyroidism 05/28/2018   Cough 05/28/2018   Vitamin D  deficiency 05/28/2018   Insomnia 05/28/2018   Lesion of oral mucosa 05/28/2018   Cardiac arrhythmia 05/21/2018   Allergies 05/21/2018   Non-seasonal allergic rhinitis 05/08/2018   Unsteadiness 05/08/2018   Past Medical History:  Diagnosis Date   Atrial fibrillation, persistent (HCC)    Cancer (HCC)    colon cancer 1999 s/p resection    History of colon polyps    Hypothyroidism    Low back pain    Mitral regurgitation    Pap smear abnormality of cervix with ASCUS favoring benign    Permanent atrial fibrillation (HCC) 08/06/2022   Sciatica    Vitamin D  deficiency    Past Surgical History:  Procedure Laterality Date   COLON RESECTION     1999 for cancer    TONSILLECTOMY     WISDOM TOOTH EXTRACTION     Family History  Problem Relation Age of Onset   Heart disease Father    Hyperlipidemia Father    Osteopenia Sister    Social History   Socioeconomic History   Marital status: Divorced    Spouse name: Not on file   Number of children: Not on file   Years of education: Not on file   Highest education level: Not on file  Occupational History   Not on file  Tobacco Use   Smoking status: Former   Smokeless tobacco: Never   Tobacco comments:     former quit in 30s light smoker per pt   Vaping Use   Vaping status: Never Used  Substance and Sexual Activity   Alcohol use: Not Currently    Alcohol/week: 14.0 standard drinks of alcohol    Types: 14 Glasses of wine per week    Comment: 2 glasses at night   Drug use: Not Currently   Sexual activity: Not Currently  Other Topics Concern   Not on file  Social History Narrative   Divorced    College grad    [place] from [place] to [place]   Former Warehouse manager    No guns   Wears seat belts    Social Drivers of Corporate investment banker Strain: Low Risk  (04/25/2021)   Overall Financial Resource Strain (CARDIA)    Difficulty of Paying Living Expenses: Not hard at all  Food Insecurity: No Food Insecurity (04/25/2021)   Hunger Vital Sign    Worried About Running Out of Food in the Last Year: Never true    Ran Out of Food in  the Last Year: Never true  Transportation Needs: No Transportation Needs (04/25/2021)   PRAPARE - Administrator, Civil Service (Medical): No    Lack of Transportation (Non-Medical): No  Physical Activity: Sufficiently Active (04/25/2021)   Exercise Vital Sign    Days of Exercise per Week: 3 days    Minutes of Exercise per Session: 60 min  Stress: No Stress Concern Present (04/25/2021)   [employer] of Occupational Health - Occupational Stress Questionnaire    Feeling of Stress : Not at all  Social Connections: Unknown (04/25/2021)   Social Connection and Isolation Panel    Frequency of Communication with Friends and Family: More than three times a week    Frequency of Social Gatherings with Friends and Family: More than three times a week    Attends Religious Services: Not on file    Active Member of Clubs or Organizations: Not on file    Attends Banker Meetings: Not on file    Marital Status: Not on file  Intimate Partner Violence: Not At Risk (04/25/2021)   Humiliation, Afraid, Rape, and Kick  questionnaire    Fear of Current or Ex-Partner: No    Emotionally Abused: No    Physically Abused: No    Sexually Abused: No   Outpatient Medications Prior to Visit  Medication Sig Dispense Refill   Calcium  Carbonate (CALCIUM  600 PO) Take 600 mg by mouth daily.     fexofenadine  (ALLEGRA  ALLERGY) 180 MG tablet Take 1 tablet (180 mg total) by mouth daily. 30 tablet 2   metoprolol  succinate (TOPROL -XL) 50 MG 24 hr tablet TAKE ONE HALF (1/2) TABLET BY MOUTH DAILY. 45 tablet 3   Multiple Vitamin (MULTIVITAMIN) tablet Take 1 tablet by mouth daily.     sertraline  (ZOLOFT ) 100 MG tablet Take 1 tablet (100 mg total) by mouth daily. 90 tablet 1   SYNTHROID  75 MCG tablet TAKE ONE TABLET BY MOUTH ONCE DAILY BEFORE BREAKFAST. 90 tablet 0   XARELTO  20 MG TABS tablet TAKE ONE TABLET BY MOUTH ONCE DAILY WITH SUPPER. 90 tablet 1   solifenacin  (VESICARE ) 10 MG tablet Take 1 tablet (10 mg total) by mouth daily. 30 tablet 0   acetaminophen  (TYLENOL ) 500 MG tablet Take 2 tablets (1,000 mg total) by mouth every 8 (eight) hours. (Patient not taking: Reported on 03/24/2024)     No facility-administered medications prior to visit.   Allergies  Allergen Reactions   Morpholine Salicylate    Augmentin [Amoxicillin-Pot Clavulanate]     Nausea x 3 days   Codeine     Nausea     Other     Tree pollen     ROS: see HPI     Objective  Today's Vitals   03/24/24 1340  BP: (!) 99/55  Pulse: 68  SpO2: 93%  Weight: 150 lb 3.2 oz (68.1 kg)  Height: 5' 5 (1.651 m)  PainSc: 0-No pain    GENERAL: Well-appearing, in NAD. Well nourished.  SKIN: Pink, warm and dry. No rash, lesion, ulceration, or ecchymoses.  Head: Normocephalic. NECK: Trachea midline. Full ROM w/o pain or tenderness. No lymphadenopathy.  EARS: Tympanic membranes are intact, translucent without bulging and without drainage. Appropriate landmarks visualized.  EYES: Conjunctiva clear without exudates. EOMI, PERRL, no drainage present.  NOSE:  Septum midline w/o deformity. Nares patent, mucosa pink and non-inflamed w/o drainage. No sinus tenderness.  THROAT: Uvula midline. Oropharynx clear. Tonsils non-inflamed without exudate. Mucous membranes pink and moist.  RESPIRATORY: Chest  wall symmetrical. Respirations even and non-labored. Breath sounds clear to auscultation bilaterally.  CARDIAC: S1, S2 present, irregular rate and irregular rhythm without murmur or gallops. Peripheral pulses 2+ bilaterally.  MSK: Muscle tone and strength appropriate for age. Joints w/o tenderness, redness, or swelling.  EXTREMITIES: Without clubbing, cyanosis, or edema.  NEUROLOGIC: No motor or sensory deficits. Steady, even gait. C2-C12 intact.  PSYCH/MENTAL STATUS: Alert, oriented x 3. Cooperative, appropriate mood and affect.     Assessment & Plan:   1. Encounter to establish care (Primary) Patient is an 83 year old female who presents today to establish care with primary care at [facility]. Reviewed the past medical history, family history, social history, surgical history, medications and allergies today- updates made as indicated. Patient has concerns today about overactive bladder.   2. Vitamin D  deficiency History of vitamin D  deficiency. Discussed continuing to take multivitamin with vitamin D .   3. Acquired hypothyroidism Chronic. Currently taking Synthroid  75 mcg daily.   4. Longstanding persistent atrial fibrillation (HCC) Chronic. Currently taking metoprolol  25mg  daily and Xarelto  20mg  daily. Followed by cardiology.   5. Mood disorder (HCC) Chronic. Currently doing well on Zoloft  100mg  daily.   6. OAB (overactive bladder) Chronic urinary and anal incontinence, possibly exacerbated by pelvic fracture. Previous VESIcare  trial insufficient. Discussed Myrbetriq and oxybutynin , noting oxybutynin 's anticholinergic side effects. Patient prefers cost-effective option. Monitor for side effects such as dry eyes, reduced urination, decreased  cognition, headache, nausea and constipation. Will follow-up in 4 weeks.  - oxybutynin  (DITROPAN -XL) 5 MG 24 hr tablet; Take 1 tablet (5 mg total) by mouth at bedtime.  Dispense: 30 tablet; Refill: 2  7. Chronic midline low back pain without sciatica Currently on low dose prednisone- will request records from [facility].     Return in about 4 weeks (around 04/21/2024) for OAB .   Evalene Arts, FNP

## 2024-03-25 ENCOUNTER — Ambulatory Visit: Payer: Self-pay | Admitting: Physician Assistant

## 2024-05-02 ENCOUNTER — Encounter: Payer: Self-pay | Admitting: Family Medicine

## 2024-05-05 ENCOUNTER — Ambulatory Visit: Payer: Self-pay | Admitting: Family Medicine

## 2024-05-05 DIAGNOSIS — E039 Hypothyroidism, unspecified: Secondary | ICD-10-CM

## 2024-05-05 DIAGNOSIS — F39 Unspecified mood [affective] disorder: Secondary | ICD-10-CM

## 2024-05-05 NOTE — Telephone Encounter (Signed)
 FYI Only or Action Required?: Action required by provider: medication refill request.  Patient was last seen in primary care on 03/24/2024 by Taylor Small, FNP.  Called Nurse Triage reporting Medication Refill.  Triage Disposition: Call PCP Now  Patient/caregiver understands and will follow disposition?: Yes      Copied from CRM #[id number]. Topic: Clinical - Red Word Triage >> May 05, 2024  1:33 PM Taylor Mccarthy wrote: Red Word that prompted transfer to Nurse Triage: Patient is very upset because her doctor left practice, but she needs a refill of medication and she has no idea what the medication name is, and states she will die without it.  Tranferring for  help with medication name       Reason for Disposition  [1] Prescription refill request for ESSENTIAL medicine (i.e., likelihood of harm to patient if not taken) AND [2] triager unable to refill per department policy  Answer Assessment - Initial Assessment Questions Patient states she has been trying to get these medications for over a week and would like them refilled today if possible, Please advise.     1. DRUG NAME: What medicine do you need to have refilled?     Synthroid  and Sertraline   Protocols used: Medication Refill and Renewal Call-A-AH

## 2024-05-06 ENCOUNTER — Other Ambulatory Visit: Payer: Self-pay | Admitting: Family Medicine

## 2024-05-06 DIAGNOSIS — E039 Hypothyroidism, unspecified: Secondary | ICD-10-CM

## 2024-05-06 DIAGNOSIS — F39 Unspecified mood [affective] disorder: Secondary | ICD-10-CM

## 2024-05-06 NOTE — Telephone Encounter (Unsigned)
 Copied from CRM #[id number]. Topic: Clinical - Medication Refill >> May 06, 2024 12:17 PM Shardie S wrote: Medication: SYNTHROID  75 MCG tablet sertraline  (ZOLOFT ) 100 MG tablet  Has the patient contacted their pharmacy? Yes (Agent: If no, request that the patient contact the pharmacy for the refill. If patient does not wish to contact the pharmacy document the reason why and proceed with request.) (Agent: If yes, when and what did the pharmacy advise?)  This is the patient's preferred pharmacy:  [facility] - [place], [place] - [address] [place] [address] Phone: [phone] Fax: [phone]  Is this the correct pharmacy for this prescription? Yes If no, delete pharmacy and type the correct one.   Has the prescription been filled recently? No  Is the patient out of the medication? Yes, has been without medication for 1 week  Has the patient been seen for an appointment in the last year OR does the patient have an upcoming appointment? Yes  Can we respond through MyChart? No  Agent: Please be advised that Rx refills may take up to 3 business days. We ask that you follow-up with your pharmacy.

## 2024-05-07 ENCOUNTER — Telehealth: Payer: Self-pay | Admitting: Family Medicine

## 2024-05-07 ENCOUNTER — Other Ambulatory Visit: Payer: Self-pay | Admitting: Family Medicine

## 2024-05-07 DIAGNOSIS — F39 Unspecified mood [affective] disorder: Secondary | ICD-10-CM

## 2024-05-07 DIAGNOSIS — E039 Hypothyroidism, unspecified: Secondary | ICD-10-CM

## 2024-05-07 MED ORDER — SYNTHROID 75 MCG PO TABS: ORAL_TABLET | ORAL | 0 refills | Status: AC

## 2024-05-07 MED ORDER — SERTRALINE HCL 100 MG PO TABS: 100.0000 mg | ORAL_TABLET | Freq: Every day | ORAL | 1 refills | Status: AC

## 2024-05-07 NOTE — Telephone Encounter (Signed)
 Incoming call from [facility] regarding patient being out of her medication (synthroid )  for one week.  A CRM as been sent to the clinical team. Teams message sent to CMA.

## 2024-05-07 NOTE — Telephone Encounter (Signed)
 Copied from CRM #[id number]. Topic: Clinical - Medication Refill >> May 06, 2024 12:17 PM Shardie S wrote: Medication: SYNTHROID  75 MCG tablet sertraline  (ZOLOFT ) 100 MG tablet  Has the patient contacted their pharmacy? Yes (Agent: If no, request that the patient contact the pharmacy for the refill. If patient does not wish to contact the pharmacy document the reason why and proceed with request.) (Agent: If yes, when and what did the pharmacy advise?)  This is the patient's preferred pharmacy:  [facility] - [place], [place] - [address] [place] [address] Phone: [phone] Fax: [phone]  Is this the correct pharmacy for this prescription? Yes If no, delete pharmacy and type the correct one.   Has the prescription been filled recently? No  Is the patient out of the medication? Yes, has been without medication for 1 week  Has the patient been seen for an appointment in the last year OR does the patient have an upcoming appointment? Yes  Can we respond through MyChart? No  Agent: Please be advised that Rx refills may take up to 3 business days. We ask that you follow-up with your pharmacy.
